# Patient Record
Sex: Female | Born: 1965
Health system: Southern US, Community
[De-identification: ages and names within clinical notes are randomized; demographics above are authoritative.]

## PROBLEM LIST (undated history)

## (undated) DIAGNOSIS — E039 Hypothyroidism, unspecified: Secondary | ICD-10-CM

## (undated) DIAGNOSIS — M1990 Unspecified osteoarthritis, unspecified site: Secondary | ICD-10-CM

## (undated) DIAGNOSIS — D649 Anemia, unspecified: Secondary | ICD-10-CM

## (undated) DIAGNOSIS — E119 Type 2 diabetes mellitus without complications: Secondary | ICD-10-CM

## (undated) DIAGNOSIS — N183 Chronic kidney disease, stage 3 (moderate): Secondary | ICD-10-CM

## (undated) DIAGNOSIS — E785 Hyperlipidemia, unspecified: Secondary | ICD-10-CM

## (undated) DIAGNOSIS — R7989 Other specified abnormal findings of blood chemistry: Secondary | ICD-10-CM

## (undated) DIAGNOSIS — E282 Polycystic ovarian syndrome: Secondary | ICD-10-CM

## (undated) DIAGNOSIS — E669 Obesity, unspecified: Secondary | ICD-10-CM

## (undated) DIAGNOSIS — I10 Essential (primary) hypertension: Secondary | ICD-10-CM

## (undated) HISTORY — PX: LUMBAR LAMINECTOMY: SHX95

## (undated) HISTORY — PX: ESOPHAGOGASTRODUODENOSCOPY: SHX1529

## (undated) HISTORY — PX: APPENDECTOMY: SHX54

## (undated) HISTORY — DX: Unspecified osteoarthritis, unspecified site: M19.90

## (undated) HISTORY — PX: NEPHRECTOMY: SHX65

## (undated) HISTORY — PX: OTHER SURGICAL HISTORY: SHX169

## (undated) HISTORY — DX: Anemia, unspecified: D64.9

## (undated) HISTORY — PX: LUMBAR FUSION: SHX111

## (undated) HISTORY — DX: Essential (primary) hypertension: I10

## (undated) HISTORY — DX: Other specified abnormal findings of blood chemistry: R79.89

## (undated) HISTORY — DX: Chronic kidney disease, stage 3 (moderate): N18.3

## (undated) HISTORY — PX: TUBAL LIGATION: SHX77

## (undated) HISTORY — DX: Obesity, unspecified: E66.9

## (undated) HISTORY — PX: POLYPECTOMY: SHX149

## (undated) HISTORY — DX: Hyperlipidemia, unspecified: E78.5

## (undated) HISTORY — PX: COLONOSCOPY: SHX174

## (undated) HISTORY — DX: Polycystic ovarian syndrome: E28.2

## (undated) HISTORY — DX: Hypothyroidism, unspecified: E03.9

## (undated) HISTORY — DX: Type 2 diabetes mellitus without complications: E11.9

---

## 1997-10-03 ENCOUNTER — Encounter: Admission: RE | Admit: 1997-10-03 | Discharge: 1998-01-01 | Payer: Self-pay | Admitting: Family Medicine

## 1998-11-13 ENCOUNTER — Encounter: Admission: RE | Admit: 1998-11-13 | Discharge: 1998-11-13 | Payer: Self-pay | Admitting: *Deleted

## 1998-11-19 ENCOUNTER — Encounter: Admission: RE | Admit: 1998-11-19 | Discharge: 1998-11-19 | Payer: Self-pay | Admitting: *Deleted

## 1999-11-13 ENCOUNTER — Ambulatory Visit (HOSPITAL_BASED_OUTPATIENT_CLINIC_OR_DEPARTMENT_OTHER): Admission: RE | Admit: 1999-11-13 | Discharge: 1999-11-13 | Payer: Self-pay | Admitting: Orthopedic Surgery

## 1999-11-13 ENCOUNTER — Encounter (INDEPENDENT_AMBULATORY_CARE_PROVIDER_SITE_OTHER): Payer: Self-pay | Admitting: *Deleted

## 1999-12-01 ENCOUNTER — Encounter (HOSPITAL_COMMUNITY): Admission: RE | Admit: 1999-12-01 | Discharge: 2000-02-29 | Payer: Self-pay | Admitting: Family Medicine

## 1999-12-01 ENCOUNTER — Encounter: Payer: Self-pay | Admitting: Family Medicine

## 2000-05-26 ENCOUNTER — Other Ambulatory Visit: Admission: RE | Admit: 2000-05-26 | Discharge: 2000-05-26 | Payer: Self-pay | Admitting: Gynecology

## 2000-05-26 ENCOUNTER — Encounter (INDEPENDENT_AMBULATORY_CARE_PROVIDER_SITE_OTHER): Payer: Self-pay

## 2001-05-24 ENCOUNTER — Other Ambulatory Visit: Admission: RE | Admit: 2001-05-24 | Discharge: 2001-05-24 | Payer: Self-pay | Admitting: Gynecology

## 2003-05-09 ENCOUNTER — Encounter: Admission: RE | Admit: 2003-05-09 | Discharge: 2003-05-09 | Payer: Self-pay | Admitting: Family Medicine

## 2003-09-06 ENCOUNTER — Ambulatory Visit (HOSPITAL_COMMUNITY): Admission: RE | Admit: 2003-09-06 | Discharge: 2003-09-07 | Payer: Self-pay | Admitting: Orthopaedic Surgery

## 2004-03-19 ENCOUNTER — Encounter: Admission: RE | Admit: 2004-03-19 | Discharge: 2004-03-19 | Payer: Self-pay | Admitting: Family Medicine

## 2004-07-10 ENCOUNTER — Other Ambulatory Visit: Admission: RE | Admit: 2004-07-10 | Discharge: 2004-07-10 | Payer: Self-pay | Admitting: Gynecology

## 2004-08-21 ENCOUNTER — Encounter: Admission: RE | Admit: 2004-08-21 | Discharge: 2004-08-21 | Payer: Self-pay | Admitting: Gynecology

## 2004-08-26 ENCOUNTER — Inpatient Hospital Stay (HOSPITAL_COMMUNITY): Admission: RE | Admit: 2004-08-26 | Discharge: 2004-08-30 | Payer: Self-pay | Admitting: Neurosurgery

## 2005-03-25 ENCOUNTER — Encounter: Admission: RE | Admit: 2005-03-25 | Discharge: 2005-03-25 | Payer: Self-pay | Admitting: Neurosurgery

## 2005-04-17 ENCOUNTER — Encounter: Admission: RE | Admit: 2005-04-17 | Discharge: 2005-04-17 | Payer: Self-pay | Admitting: Neurosurgery

## 2005-06-02 ENCOUNTER — Ambulatory Visit (HOSPITAL_COMMUNITY): Admission: RE | Admit: 2005-06-02 | Discharge: 2005-06-03 | Payer: Self-pay | Admitting: Neurosurgery

## 2005-06-09 ENCOUNTER — Ambulatory Visit (HOSPITAL_COMMUNITY): Admission: RE | Admit: 2005-06-09 | Discharge: 2005-06-10 | Payer: Self-pay | Admitting: Neurosurgery

## 2006-05-07 ENCOUNTER — Encounter: Admission: RE | Admit: 2006-05-07 | Discharge: 2006-05-07 | Payer: Self-pay | Admitting: Neurosurgery

## 2006-05-26 ENCOUNTER — Other Ambulatory Visit: Admission: RE | Admit: 2006-05-26 | Discharge: 2006-05-26 | Payer: Self-pay | Admitting: Gynecology

## 2006-06-01 ENCOUNTER — Encounter: Admission: RE | Admit: 2006-06-01 | Discharge: 2006-06-01 | Payer: Self-pay | Admitting: Neurosurgery

## 2006-06-09 ENCOUNTER — Encounter: Admission: RE | Admit: 2006-06-09 | Discharge: 2006-06-09 | Payer: Self-pay | Admitting: Gynecology

## 2006-07-05 ENCOUNTER — Encounter: Admission: RE | Admit: 2006-07-05 | Discharge: 2006-07-05 | Payer: Self-pay | Admitting: Neurosurgery

## 2006-08-24 ENCOUNTER — Encounter: Admission: RE | Admit: 2006-08-24 | Discharge: 2006-08-24 | Payer: Self-pay | Admitting: Neurosurgery

## 2007-01-21 ENCOUNTER — Encounter (INDEPENDENT_AMBULATORY_CARE_PROVIDER_SITE_OTHER): Payer: Self-pay | Admitting: *Deleted

## 2007-01-21 ENCOUNTER — Encounter: Payer: Self-pay | Admitting: Family Medicine

## 2007-01-21 LAB — CONVERTED CEMR LAB
ALT: 17 units/L
Albumin: 4.6 g/dL
BUN: 32 mg/dL
CO2, serum: 21 mmol/L
Calcium: 10.9 mg/dL
Cholesterol: 212 mg/dL
Creatinine, Ser: 1.84 mg/dL
HCT: 38.3 %
HDL: 26 mg/dL
Hemoglobin: 11.9 g/dL
MCV: 85.3 fL
RBC count: 4.49 10*6/uL
Sodium, serum: 138 mmol/L
WBC, blood: 9.8 10*3/uL

## 2007-06-01 ENCOUNTER — Encounter (INDEPENDENT_AMBULATORY_CARE_PROVIDER_SITE_OTHER): Payer: Self-pay | Admitting: *Deleted

## 2007-06-01 ENCOUNTER — Encounter: Payer: Self-pay | Admitting: Family Medicine

## 2007-06-01 LAB — CONVERTED CEMR LAB
Albumin: 4.5 g/dL
CO2, serum: 29 mmol/L
Sodium, serum: 140 mmol/L

## 2007-07-01 ENCOUNTER — Encounter: Admission: RE | Admit: 2007-07-01 | Discharge: 2007-07-01 | Payer: Self-pay | Admitting: Gynecology

## 2007-07-08 ENCOUNTER — Other Ambulatory Visit: Admission: RE | Admit: 2007-07-08 | Discharge: 2007-07-08 | Payer: Self-pay | Admitting: Gynecology

## 2007-08-29 ENCOUNTER — Encounter: Payer: Self-pay | Admitting: Family Medicine

## 2007-08-30 ENCOUNTER — Encounter (INDEPENDENT_AMBULATORY_CARE_PROVIDER_SITE_OTHER): Payer: Self-pay | Admitting: *Deleted

## 2007-08-30 LAB — CONVERTED CEMR LAB
Glucose, Bld: 137 mg/dL
Hgb A1c MFr Bld: 7.6 %

## 2008-01-02 ENCOUNTER — Encounter: Admission: RE | Admit: 2008-01-02 | Discharge: 2008-01-02 | Payer: Self-pay | Admitting: Neurosurgery

## 2008-01-30 ENCOUNTER — Ambulatory Visit: Payer: Self-pay | Admitting: Family Medicine

## 2008-01-30 DIAGNOSIS — R252 Cramp and spasm: Secondary | ICD-10-CM | POA: Insufficient documentation

## 2008-01-30 DIAGNOSIS — E119 Type 2 diabetes mellitus without complications: Secondary | ICD-10-CM

## 2008-01-30 DIAGNOSIS — N183 Chronic kidney disease, stage 3 unspecified: Secondary | ICD-10-CM | POA: Insufficient documentation

## 2008-01-30 DIAGNOSIS — I10 Essential (primary) hypertension: Secondary | ICD-10-CM | POA: Insufficient documentation

## 2008-01-30 DIAGNOSIS — E1169 Type 2 diabetes mellitus with other specified complication: Secondary | ICD-10-CM | POA: Insufficient documentation

## 2008-01-30 DIAGNOSIS — E785 Hyperlipidemia, unspecified: Secondary | ICD-10-CM

## 2008-01-30 HISTORY — DX: Type 2 diabetes mellitus without complications: E11.9

## 2008-01-30 HISTORY — DX: Hyperlipidemia, unspecified: E78.5

## 2008-01-30 HISTORY — DX: Chronic kidney disease, stage 3 unspecified: N18.30

## 2008-01-30 HISTORY — DX: Essential (primary) hypertension: I10

## 2008-01-30 LAB — CONVERTED CEMR LAB
ALT: 15 units/L (ref 0–35)
Bilirubin, Direct: 0.1 mg/dL (ref 0.0–0.3)
CO2: 24 meq/L (ref 19–32)
Calcium: 9.5 mg/dL (ref 8.4–10.5)
Creatinine, Ser: 1.5 mg/dL — ABNORMAL HIGH (ref 0.4–1.2)
Direct LDL: 150.2 mg/dL
GFR calc non Af Amer: 40 mL/min
Hgb A1c MFr Bld: 5.7 % (ref 4.6–6.0)
Sodium: 139 meq/L (ref 135–145)
TSH: 3.47 microintl units/mL (ref 0.35–5.50)
Total Bilirubin: 0.5 mg/dL (ref 0.3–1.2)
Total CHOL/HDL Ratio: 10.5
Triglycerides: 415 mg/dL (ref 0–149)
VLDL: 83 mg/dL — ABNORMAL HIGH (ref 0–40)

## 2008-02-01 ENCOUNTER — Encounter (INDEPENDENT_AMBULATORY_CARE_PROVIDER_SITE_OTHER): Payer: Self-pay | Admitting: *Deleted

## 2008-02-10 ENCOUNTER — Ambulatory Visit: Payer: Self-pay | Admitting: Family Medicine

## 2008-03-13 ENCOUNTER — Ambulatory Visit: Payer: Self-pay | Admitting: Family Medicine

## 2008-03-13 DIAGNOSIS — L259 Unspecified contact dermatitis, unspecified cause: Secondary | ICD-10-CM | POA: Insufficient documentation

## 2008-03-13 DIAGNOSIS — H6690 Otitis media, unspecified, unspecified ear: Secondary | ICD-10-CM | POA: Insufficient documentation

## 2008-03-14 LAB — CONVERTED CEMR LAB
ALT: 17 units/L (ref 0–35)
Bilirubin, Direct: 0.1 mg/dL (ref 0.0–0.3)
Total Bilirubin: 0.6 mg/dL (ref 0.3–1.2)
Total Protein: 7.5 g/dL (ref 6.0–8.3)

## 2008-03-19 ENCOUNTER — Telehealth (INDEPENDENT_AMBULATORY_CARE_PROVIDER_SITE_OTHER): Payer: Self-pay | Admitting: *Deleted

## 2008-05-23 ENCOUNTER — Encounter: Admission: RE | Admit: 2008-05-23 | Discharge: 2008-05-23 | Payer: Self-pay | Admitting: Orthopedic Surgery

## 2008-06-06 ENCOUNTER — Ambulatory Visit: Payer: Self-pay | Admitting: Family Medicine

## 2008-06-06 DIAGNOSIS — M25539 Pain in unspecified wrist: Secondary | ICD-10-CM | POA: Insufficient documentation

## 2008-06-07 ENCOUNTER — Telehealth (INDEPENDENT_AMBULATORY_CARE_PROVIDER_SITE_OTHER): Payer: Self-pay | Admitting: *Deleted

## 2008-06-07 LAB — CONVERTED CEMR LAB: Hgb A1c MFr Bld: 7.4 % — ABNORMAL HIGH (ref 4.6–6.5)

## 2008-06-20 ENCOUNTER — Encounter: Payer: Self-pay | Admitting: Family Medicine

## 2008-07-26 ENCOUNTER — Encounter: Admission: RE | Admit: 2008-07-26 | Discharge: 2008-07-26 | Payer: Self-pay | Admitting: Neurosurgery

## 2008-08-30 ENCOUNTER — Encounter: Admission: RE | Admit: 2008-08-30 | Discharge: 2008-08-30 | Payer: Self-pay | Admitting: Neurosurgery

## 2008-10-01 ENCOUNTER — Ambulatory Visit: Payer: Self-pay | Admitting: Family Medicine

## 2008-10-01 DIAGNOSIS — E790 Hyperuricemia without signs of inflammatory arthritis and tophaceous disease: Secondary | ICD-10-CM | POA: Insufficient documentation

## 2008-10-01 DIAGNOSIS — R7989 Other specified abnormal findings of blood chemistry: Secondary | ICD-10-CM

## 2008-10-01 HISTORY — DX: Other specified abnormal findings of blood chemistry: R79.89

## 2008-10-02 ENCOUNTER — Telehealth (INDEPENDENT_AMBULATORY_CARE_PROVIDER_SITE_OTHER): Payer: Self-pay | Admitting: *Deleted

## 2008-10-02 LAB — CONVERTED CEMR LAB
ALT: 15 units/L (ref 0–35)
AST: 15 units/L (ref 0–37)
Albumin: 3.6 g/dL (ref 3.5–5.2)
Calcium: 9.6 mg/dL (ref 8.4–10.5)
Chloride: 110 meq/L (ref 96–112)
Cholesterol: 231 mg/dL — ABNORMAL HIGH (ref 0–200)
Creatinine, Ser: 1.3 mg/dL — ABNORMAL HIGH (ref 0.4–1.2)
Direct LDL: 151 mg/dL
HDL: 33 mg/dL — ABNORMAL LOW (ref >39.00)
Hgb A1c MFr Bld: 7 % — ABNORMAL HIGH (ref 4.6–6.5)
Sodium: 139 meq/L (ref 135–145)
Triglycerides: 277 mg/dL — ABNORMAL HIGH (ref 0.0–149.0)
Uric Acid, Serum: 9.1 mg/dL — ABNORMAL HIGH (ref 2.4–7.0)

## 2008-10-11 ENCOUNTER — Encounter (INDEPENDENT_AMBULATORY_CARE_PROVIDER_SITE_OTHER): Payer: Self-pay | Admitting: *Deleted

## 2008-11-14 ENCOUNTER — Ambulatory Visit: Payer: Self-pay | Admitting: Family Medicine

## 2008-11-30 ENCOUNTER — Encounter: Admission: RE | Admit: 2008-11-30 | Discharge: 2008-11-30 | Payer: Self-pay | Admitting: Gynecology

## 2008-12-26 ENCOUNTER — Encounter: Payer: Self-pay | Admitting: Family Medicine

## 2008-12-26 ENCOUNTER — Ambulatory Visit: Payer: Self-pay | Admitting: Family

## 2008-12-26 ENCOUNTER — Encounter: Payer: Self-pay | Admitting: Internal Medicine

## 2008-12-26 LAB — CONVERTED CEMR LAB
BUN: 21 mg/dL (ref 6–23)
CO2: 24 meq/L (ref 19–32)
Calcium: 9.5 mg/dL (ref 8.4–10.5)
Chloride: 105 meq/L (ref 96–112)
Creatinine, Ser: 1.1 mg/dL (ref 0.4–1.2)
Direct LDL: 72.2 mg/dL
Glucose, Bld: 163 mg/dL — ABNORMAL HIGH (ref 70–99)
Total CHOL/HDL Ratio: 7

## 2008-12-28 ENCOUNTER — Encounter: Payer: Self-pay | Admitting: Family

## 2008-12-31 ENCOUNTER — Telehealth: Payer: Self-pay | Admitting: Family

## 2009-01-04 ENCOUNTER — Ambulatory Visit: Payer: Self-pay | Admitting: Endocrinology

## 2009-01-04 ENCOUNTER — Telehealth: Payer: Self-pay | Admitting: Endocrinology

## 2009-01-04 DIAGNOSIS — E282 Polycystic ovarian syndrome: Secondary | ICD-10-CM

## 2009-01-04 HISTORY — DX: Polycystic ovarian syndrome: E28.2

## 2009-01-07 ENCOUNTER — Encounter: Payer: Self-pay | Admitting: Endocrinology

## 2009-03-29 ENCOUNTER — Ambulatory Visit: Payer: Self-pay | Admitting: Endocrinology

## 2009-06-04 ENCOUNTER — Ambulatory Visit: Payer: Self-pay | Admitting: Endocrinology

## 2009-06-04 LAB — CONVERTED CEMR LAB
Cholesterol: 196 mg/dL (ref 0–200)
Direct LDL: 114.4 mg/dL
HDL: 25.9 mg/dL — ABNORMAL LOW (ref >39.00)
Triglycerides: 335 mg/dL — ABNORMAL HIGH (ref 0.0–149.0)

## 2009-06-25 ENCOUNTER — Encounter: Payer: Self-pay | Admitting: Endocrinology

## 2009-09-10 ENCOUNTER — Ambulatory Visit: Payer: Self-pay | Admitting: Family Medicine

## 2009-10-03 ENCOUNTER — Ambulatory Visit: Payer: Self-pay | Admitting: Family Medicine

## 2009-10-10 ENCOUNTER — Telehealth (INDEPENDENT_AMBULATORY_CARE_PROVIDER_SITE_OTHER): Payer: Self-pay | Admitting: *Deleted

## 2009-10-10 LAB — CONVERTED CEMR LAB
ALT: 15 units/L (ref 0–35)
AST: 18 units/L (ref 0–37)
Albumin: 4 g/dL (ref 3.5–5.2)
Alkaline Phosphatase: 38 units/L — ABNORMAL LOW (ref 39–117)
Total Protein: 7.3 g/dL (ref 6.0–8.3)
VLDL: 61.6 mg/dL — ABNORMAL HIGH (ref 0.0–40.0)

## 2009-11-27 ENCOUNTER — Encounter: Admission: RE | Admit: 2009-11-27 | Discharge: 2009-11-27 | Payer: Self-pay | Admitting: Neurosurgery

## 2009-12-02 ENCOUNTER — Encounter: Admission: RE | Admit: 2009-12-02 | Discharge: 2009-12-02 | Payer: Self-pay | Admitting: Gynecology

## 2009-12-03 ENCOUNTER — Ambulatory Visit: Payer: Self-pay | Admitting: Endocrinology

## 2009-12-03 LAB — CONVERTED CEMR LAB: Hgb A1c MFr Bld: 6.4 % (ref 4.6–6.5)

## 2010-03-04 NOTE — Progress Notes (Signed)
Summary: labs  Phone Note Outgoing Call   Call placed by: Malachi Bonds CMA,  October 10, 2009 9:01 AM Call placed to: Patient Summary of Call: pt's LDL, VLDL, trigs all elevated.  HDL too low.  would really benefit from statin as pt's cholesterol is 2x her goal.  would recommend Crestor 10mg  nightly.  samples available to see if she can tolerate this dose.  very important to protect against heart disease and stroke.   Follow-up for Phone Call        spoke w/ patient says she can't take any cholesterol medications due to muscles aches since she already has this problems so would prefer not use any meds informed patient will let Dr. Birdie Riddle know.......Marland KitchenMalachi Bonds CMA  October 10, 2009 9:29 AM

## 2010-03-04 NOTE — Assessment & Plan Note (Signed)
Summary: LATE FEB FU  STC   Vital Signs:  Patient profile:   45 year old female Menstrual status:  regular Height:      61.25 inches (155.57 cm) Weight:      190.25 pounds (86.48 kg) O2 Sat:      99 % on Room air Temp:     99.0 degrees F (37.22 degrees C) oral Pulse rate:   71 / minute BP sitting:   140 / 90  (left arm) Cuff size:   large  Vitals Entered By: Gardenia Phlegm RMA (March 29, 2009 10:48 AM)  O2 Flow:  Room air CC: Follow-up visit/ pt states she never took Onglyza and she is no longer taking Crestor/ CF Is Patient Diabetic? Yes   Referring Provider:  tabori  CC:  Follow-up visit/ pt states she never took Onglyza and she is no longer taking Crestor/ CF.  History of Present Illness: pt states she feels well in general.  no cbg record, but states cbg's are well-controlled (low to mid-100's).   pt stopped the crestor due to myalgias, which have resolved since the discontinuation. she did not take the vastoec today.  she has no cough.  Current Medications (verified): 1)  Enalapril Maleate 10 Mg Tabs (Enalapril Maleate) .... Take One Tablet Two Times A Day. 2)  Furosemide 20 Mg Tabs (Furosemide) .... Take One Tablet As Needed 3)  Crestor 20 Mg Tabs (Rosuvastatin Calcium) .Marland Kitchen.. 1 Tablet By Mouth Daily 4)  Allopurinol 100 Mg Tabs (Allopurinol) .... Take One Tablet Daily 5)  Colchicine 0.6 Mg Tabs (Colchicine) .... Take As Needed 6)  Fenofibrate 160 Mg Tabs (Fenofibrate) .... One Tablet By Mouth Daily 7)  Actos 45 Mg Tabs (Pioglitazone Hcl) .Marland Kitchen.. 1 Qd 8)  Onglyza 5 Mg Tabs (Saxagliptin Hcl) .Marland Kitchen.. 1 Qam  Allergies (verified): 1)  ! Penicillin  Past History:  Past Medical History: Last updated: 10/01/2008 Diabetes mellitus, type II Hyperlipidemia Hypertension chronic kidney dz- follows w/ Renal elevated uric acid  Review of Systems  The patient denies dyspnea on exertion, weight loss, and weight gain.    Physical Exam  General:  obese.  no  distress  Extremities:  no edema.    Impression & Recommendations:  Problem # 1:  DIABETES MELLITUS, TYPE II (ICD-250.00) well-controlled  Problem # 2:  HYPERLIPIDEMIA (B2193296.4) myalgias perceived due to crestor.  Problem # 3:  HYPERTENSION (ICD-401.9) considering she has not yet taken her medication today, well-controlled.  Other Orders: Est. Patient Level IV [id number])  Patient Instructions: 1)  check your blood sugar 1 time a day.  vary the time of day when you check, between before the 3 meals, and at bedtime.  also check if you have symptoms of your blood sugar being too high or too low.  please keep a record of the readings and bring it to your next appointment here.  please call us sooner if you are having low blood sugar episodes. 2)  same actos (45 mg once daily). 3)  return 2 months, fasting (we may be able to reduce or stop the fenofibrate). 4)  same enalapril. 5)  redouble dietary efforts.

## 2010-03-04 NOTE — Assessment & Plan Note (Signed)
Summary: follow up r/s from 03/27/09 / tf,cma   Vital Signs:  Patient profile:   45 year old female Menstrual status:  regular Weight:      185 pounds Pulse rate:   66 / minute BP sitting:   120 / 80  (left arm)  Vitals Entered By: Malachi Bonds CMA (September 10, 2009 11:20 AM) CC: f/u on cholesterol    History of Present Illness: 45 yo woman here today for f/u on  1) cholesterol- ran out of fenofibrate 1 month ago.  due for labs today but would prefer to wait until meds back in system for 1 month.  2) HTN- BP well controlled on enalapril.  no CP, SOB, HAs, visual changes, edema.  3) Hyperuricemia- elevated on labs done by Dr Lorrene Reid.  recommended restarting allopurinol.  pt not interested.  has labs next month w/ Renal.  Current Medications (verified): 1)  Enalapril Maleate 10 Mg Tabs (Enalapril Maleate) .... Take One Tablet Two Times A Day. 2)  Furosemide 20 Mg Tabs (Furosemide) .... Take One Tablet As Needed 3)  Fenofibrate 160 Mg Tabs (Fenofibrate) .... One Tablet By Mouth Daily 4)  Actos 45 Mg Tabs (Pioglitazone Hcl) .Marland Kitchen.. 1 Qd  Allergies (verified): 1)  ! Penicillin  Past History:  Past Medical History: Last updated: 10/01/2008 Diabetes mellitus, type II Hyperlipidemia Hypertension chronic kidney dz- follows w/ Renal elevated uric acid  Review of Systems      See HPI  Physical Exam  General:  Well-developed,well-nourished,in no acute distress; alert,appropriate and cooperative throughout examination Neck:  No deformities, masses, or tenderness noted. Lungs:  Normal respiratory effort, chest expands symmetrically. Lungs are clear to auscultation, no crackles or wheezes. Heart:  Normal rate and regular rhythm. S1 and S2 normal without gallop, murmur, click, rub or other extra sounds. Abdomen:  soft, NT/ND, +BS Pulses:  +2 carotid, radial, DP Extremities:  No clubbing, cyanosis, edema, or deformity noted with normal full range of motion of all joints.      Impression & Recommendations:  Problem # 1:  HYPERLIPIDEMIA (B2193296.4) Assessment Unchanged pt has been off meds for  ~1 month.  wants to hold off on labs until she resumes meds for a month.  will have pt return for fasting labs. Her updated medication list for this problem includes:    Fenofibrate 160 Mg Tabs (Fenofibrate) ..... One tablet by mouth daily  Problem # 2:  HYPERTENSION (ICD-401.9) Assessment: Unchanged well controlled.  asymptomatic.  getting labs through renal. Her updated medication list for this problem includes:    Enalapril Maleate 10 Mg Tabs (Enalapril maleate) .Marland Kitchen... Take one tablet two times a day.    Furosemide 20 Mg Tabs (Furosemide) .Marland Kitchen... Take one tablet as needed  Problem # 3:  HYPERURICEMIA (ICD-790.6) Assessment: Unchanged discussed this w/ pt.  encouraged her to resume allopurinol as directed by Dr Lorrene Reid.  pt not interested in this.  will have labs next month.  will follow along.  Complete Medication List: 1)  Enalapril Maleate 10 Mg Tabs (Enalapril maleate) .... Take one tablet two times a day. 2)  Furosemide 20 Mg Tabs (Furosemide) .... Take one tablet as needed 3)  Fenofibrate 160 Mg Tabs (Fenofibrate) .... One tablet by mouth daily 4)  Actos 45 Mg Tabs (Pioglitazone hcl) .Marland Kitchen.. 1 qd  Patient Instructions: 1)  Schedule a lab visit in 4-6 weeks. 2)  Hepatic Panel prior to visit ICD-9: 272.4 3)  Lipid panel prior to visit ICD-9 : 272.4 4)  Keep up the good work on diet and exercise! 5)  Call with any questions or concerns 6)  Have a great labor day!!

## 2010-03-04 NOTE — Assessment & Plan Note (Signed)
Summary: 6 MTH FU  STC   Vital Signs:  Patient profile:   45 year old female Menstrual status:  regular Height:      60 inches (152.40 cm) Weight:      188.38 pounds (85.63 kg) BMI:     36.92 O2 Sat:      96 % on Room air Temp:     98.3 degrees F (36.83 degrees C) oral Pulse rate:   80 / minute BP sitting:   111 / 78  (left arm) Cuff size:   large  Vitals Entered By: Rebeca Alert CMA Deborra Medina) (December 03, 2009 10:43 AM)  O2 Flow:  Room air CC: 6 month F/U/aj Is Patient Diabetic? Yes   Referring Provider:  tabori  CC:  6 month F/U/aj.  History of Present Illness: no cbg record, but states cbg's were well-controlled, until a steriod injection last week.  it went up to the 200's, but has since returned to the low-100's.    Current Medications (verified): 1)  Enalapril Maleate 10 Mg Tabs (Enalapril Maleate) .... Take One Tablet Two Times A Day. 2)  Furosemide 20 Mg Tabs (Furosemide) .... Take One Tablet As Needed 3)  Fenofibrate 160 Mg Tabs (Fenofibrate) .... One Tablet By Mouth Daily 4)  Actos 45 Mg Tabs (Pioglitazone Hcl) .Marland Kitchen.. 1 Qd  Allergies (verified): 1)  ! Penicillin  Past History:  Past Medical History: Last updated: 10/01/2008 Diabetes mellitus, type II Hyperlipidemia Hypertension chronic kidney dz- follows w/ Renal elevated uric acid  Social History: Reviewed history from 01/04/2009 and no changes required. No tobacco, ETOH Married disabled for back pain  Review of Systems       The patient complains of weight gain.    Physical Exam  General:  obese.   Pulses:  dorsalis pedis intact bilat.  no carotid bruit  Extremities:  no deformity.  no ulcer on the feet.  feet are of normal color and temp.  no edema  Neurologic:  sensation is intact to touch on the feet  Additional Exam:  Hemoglobin A1C            6.4 %     Impression & Recommendations:  Problem # 1:  DIABETES MELLITUS, TYPE II (ICD-250.00) well-controlled  Other Orders: TLB-A1C /  Hgb A1C (Glycohemoglobin) (83036-A1C) Est. Patient Level III [id number])  Patient Instructions: 1)  blood tests are being ordered for you today.  please call [phone] to hear your test results. 2)  pending the test results, please continue the same medications for now. 3)  Please schedule a follow-up appointment in 6 months. 4)  (update: i left message on phone-tree:  rx as we discussed)   Orders Added: 1)  TLB-A1C / Hgb A1C (Glycohemoglobin) [83036-A1C] 2)  Est. Patient Level III [id number]

## 2010-03-04 NOTE — Letter (Signed)
Summary: [facility]   Imported By: Phillis Knack 07/23/2009 11:05:37  _____________________________________________________________________  External Attachment:    Type:   Image     Comment:   External Document

## 2010-03-04 NOTE — Assessment & Plan Note (Signed)
Summary: 2 MTH FU--STC     Vital Signs:  Patient profile:   45 year old female Menstrual status:  regular Height:      60 inches Weight:      186 pounds BMI:     36.46 O2 Sat:      99 % on Room air Temp:     98 degrees F oral Pulse rate:   68 / minute BP sitting:   120 / 80  (left arm) Cuff size:   regular  Vitals Entered ByShirlean Mylar Ewing (Jun 04, 2009 9:07 AM)  O2 Flow:  Room air CC: 2 month followup/RE   Referring Provider:  tabori  CC:  2 month followup/RE.  History of Present Illness: pt states she feels well in general, except for allergy problems.  no cbg record, but states cbg's are  well-controlled.  Current Medications (verified): 1)  Enalapril Maleate 10 Mg Tabs (Enalapril Maleate) .... Take One Tablet Two Times A Day. 2)  Furosemide 20 Mg Tabs (Furosemide) .... Take One Tablet As Needed 3)  Allopurinol 100 Mg Tabs (Allopurinol) .... Take One Tablet Daily 4)  Colchicine 0.6 Mg Tabs (Colchicine) .... Take As Needed 5)  Fenofibrate 160 Mg Tabs (Fenofibrate) .... One Tablet By Mouth Daily 6)  Actos 45 Mg Tabs (Pioglitazone Hcl) .Marland Kitchen.. 1 Qd  Allergies (verified): 1)  ! Penicillin  Past History:  Past Medical History: Last updated: 10/01/2008 Diabetes mellitus, type II Hyperlipidemia Hypertension chronic kidney dz- follows w/ Renal elevated uric acid  Past Surgical History: Reviewed history from 01/30/2008 and no changes required. Lumbar laminectomy-L4-5 Reflux surgery Nephrectomy-left removed, partial right-Ottelin Appendectomy Right Hand cyst Tubal ligation- GYN Mezer L4-5 diskectomy Neural Stimulator  Social History: Reviewed history from 01/04/2009 and no changes required. No tobacco, ETOH Married disabled  Review of Systems  The patient denies hypoglycemia.    Physical Exam  General:  normal appearance.   Extremities:  no edema.  Additional Exam:  Hemoglobin A1C            6.1 %                       4.6-6.5 LDL      114.4 mg/dL     Impression & Recommendations:  Problem # 1:  DIABETES MELLITUS, TYPE II (ICD-250.00) well-controlled  Problem # 2:  dyslipidemia needs increased rx, in view of #1  Other Orders: TLB-Lipid Panel (80061-LIPID) TLB-A1C / Hgb A1C (Glycohemoglobin) (83036-A1C) Est. Patient Level III [id number])  Patient Instructions: 1)  check your blood sugar 1 time a day.  vary the time of day when you check, between before the 3 meals, and at bedtime.  also check if you have symptoms of your blood sugar being too high or too low.  please keep a record of the readings and bring it to your next appointment here.  please call us sooner if you are having low blood sugar episodes. 2)  tests are being ordered for you today.  a few days after the test(s), please call [phone] to hear your test results. 3)  pending the test results, please continue the same medications for now. 4)  Please schedule a follow-up appointment in 6 months. 5)  (update: i left message on phone-tree:  rx as we discussed.  you should also consider adding zocor).

## 2010-06-03 ENCOUNTER — Other Ambulatory Visit (INDEPENDENT_AMBULATORY_CARE_PROVIDER_SITE_OTHER): Payer: Medicare Other

## 2010-06-03 ENCOUNTER — Encounter: Payer: Self-pay | Admitting: Endocrinology

## 2010-06-03 ENCOUNTER — Ambulatory Visit (INDEPENDENT_AMBULATORY_CARE_PROVIDER_SITE_OTHER): Payer: Medicare Other | Admitting: Endocrinology

## 2010-06-03 VITALS — BP 120/70 | HR 74 | Temp 98.9°F | Resp 16 | Ht 60.0 in | Wt 195.0 lb

## 2010-06-03 DIAGNOSIS — E119 Type 2 diabetes mellitus without complications: Secondary | ICD-10-CM

## 2010-06-03 LAB — HEMOGLOBIN A1C: Hgb A1c MFr Bld: 7 % — ABNORMAL HIGH (ref 4.6–6.5)

## 2010-06-03 NOTE — Patient Instructions (Addendum)
blood tests are being ordered for you today.  please call [phone] to hear your test results. pending the test results, please continue the same medications for now good diet and exercise habits significanly improve the control of your diabetes.  please let me know if you wish to be referred to a dietician.  high blood sugar is very risky to your health.  you should see an eye doctor every year. controlling your blood pressure and cholesterol drastically reduces the damage diabetes does to your body.  this also applies to quitting smoking.  please discuss these with your doctor.  you should take an aspirin every day, unless you have been advised by a doctor not to. Please make a follow-up appointment in 6 months. (update: i left message on phone-tree:  You should add metformin (preferred) or [place].

## 2010-06-03 NOTE — Progress Notes (Signed)
  Subjective:    Patient ID: Marissa Scott, female    DOB: 28-Oct-1965, 45 y.o.   MRN: [id number]  HPI Pt returns for f/u of type 2 dm.  pt states she feels well in general.  She takes actos as rx'ed.  She says cbg's are well-controlled. No past medical history on file. No past surgical history on file.  reports that she has never smoked. She does not have any smokeless tobacco history on file. Her alcohol and drug histories not on file. family history is not on file. Allergies  Allergen Reactions  . Penicillins      Review of Systems Denies weight change    Objective:   Physical Exam GENERAL: no distress Pulses: dorsalis pedis intact bilat.   Feet: no deformity.  no ulcer on the feet.  feet are of normal color and temp.  no edema.  There is onychomycosis of the left 3rd toenail. Neuro: sensation is intact to touch on the feet.    Lab Results  Component Value Date   HGBA1C 7.0* 06/03/2010    Assessment & Plan:  Dm, worse control

## 2010-06-20 NOTE — Op Note (Signed)
NAMESHAYLI, FLOBERG               ACCOUNT NO.:  [id number]   MEDICAL RECORD NO.:  [id number]          PATIENT TYPE:  INP   LOCATION:  2899                         FACILITY:  [facility]   PHYSICIAN:  Elizabeth Sauer, M.D.      DATE OF BIRTH:  08/20/1965   DATE OF PROCEDURE:  08/26/2004  DATE OF DISCHARGE:                                 OPERATIVE REPORT   PREOPERATIVE DIAGNOSIS:  Spondylosis and extraforaminal disk at L4-5 on the  left.   POSTOPERATIVE DIAGNOSIS:  Spondylosis and extraforaminal disk at L4-5 on the  left.   OPERATION PERFORMED:  L4-5 laminectomy and diskectomy, posterior lumbar  interbody fusion with Ray threaded fusion cages, posterolateral arthrodesis  with InFuse bone morphogenic protein.   SURGEON:  Elizabeth Sauer, M.D.   NURSE ASSISTANT:  [facility].   DOCTOR ASSISTANT:  Otilio Connors, M.D.   ANESTHESIA:  General endotracheal.   PREP:  Sterile Betadine prep and scrub with alcohol wipe.   COMPLICATIONS:  None.   INDICATIONS FOR PROCEDURE:  This is a 45 year old lady who had had a prior  foraminal diskectomy at 4-5 on the left  but has mild left leg pain and MR  evidence of a disk in the left neural foramen.   DESCRIPTION OF PROCEDURE:  The patient was taken to the operating room,  smoothly anesthetized, intubated, and placed prone on the operating table.  Following shave, prep and drape in the usual sterile fashion, a midline  incision was created, extending from mid-L3 to mid-L5.  The lamina of L4 and  the transverse processes of L4 and L5 as well as the 4-5 facet joint were  exposed bilaterally in the subperiosteal plane.  Intraoperative x-ray  confirmed correctness of the level.  Having confirmed correctness of level,  the pars interarticularis lamina and inferior facet of L4 and superior facet  of L5 were removed bilaterally using the high speed drill.  This allowed  decompression of the nerve roots and exposure of the 4 root and 5 roots that  traversed  this area.  On the right side, there was some mild bulging of the  4-5 disk.  On the left side, there was a prominent subligamentous disk with  elevation of the left L4 nerve root and compression of the dorsal root  ganglion and the neural foramen.  This was all completely decompressed.  The  wound was irrigated and hemostasis assured.  16 x 21 mm  Ray threaded fusion  cages were placed at 4-5.  The transverse processes were decorticated.  Bone  graft preserved from the facet joints was placed within the cages and they  were capped across the intertransverse area.  Bone morphogenic protein as  InFuse on the carrier matrix was placed.  Intraoperative x-ray showed good  placement of Ray cages.  The fascia and subcutaneous tissues were  reapproximated with 0 Vicryl in interrupted fashion.  Subcuticular tissues  were reapproximated with 3-  0 Vicryl in interrupted fashion.  Skin was closed with 3-0 nylon in running  locked fashion.  Betadine and Telfa dressing was applied and  made occlusive  with OpSite.  The patient was then transferred to the recovery room in good  condition.       MWR/MEDQ  D:  08/26/2004  T:  08/26/2004  Job:  [id number]

## 2010-06-20 NOTE — H&P (Signed)
Marissa Scott, Marissa Scott               ACCOUNT NO.:  [id number]   MEDICAL RECORD NO.:  [id number]          PATIENT TYPE:  AMB   LOCATION:  SDS                          FACILITY:  [facility]   PHYSICIAN:  Elizabeth Sauer, M.D.      DATE OF BIRTH:  Jan 21, 1966   DATE OF ADMISSION:  06/02/2005  DATE OF DISCHARGE:                                HISTORY & PHYSICAL   ADMISSION DIAGNOSES:  Left L4 nerve root injury with placement of spinal  cord stimulator.   This is a very nice 45 year old right-handed white girl who underwent a  foraminal diskectomy in August 2005.  She did well for about 3 weeks, had  recurrence of her pain.  She had an MRI that demonstrated a 4-5 foraminal  disk with compression left L4 doral ganglion.  She underwent a decompression  and fusion at L4-5 in July 2006.  Postoperatively, her leg pain persisted  and is worsening.  She has undergone diskography and injection of the cages,  diskography at 5-1 for source of pain.  It is not there.  She has undergone  MRI of the brain and cervical and thoracic spine to rule out MS, and she is  now admitted for placement of spinal cord stimulator.   PAST MEDICAL HISTORY:  Remarkable for renal problems.  She has had a left  nephrectomy and partial right nephrectomy, although her renal function is  normal.   MEDICATIONS:  1.  Enalapril 5 mg twice a day.  2.  Avandia 4 mg twice a day for polycystic ovaries and ultrasound on a      p.r.n. basis.   SOCIAL HISTORY:  She does not smoke or drink and works for [employer].   FAMILY HISTORY:  Mom is 37 in good health.  Dad is 26 in good health with  cardiac disease, hypertension, renal disease, diabetes, and thyroid disease.   REVIEW OF SYSTEMS:  Remarkable for glasses, hypertension, swelling in hands  and feet, leg pain, back pain, leg weakness, arm pain, arthritis, and being  allergic to GRAPES.   PHYSICAL EXAMINATION:  HEENT:  Within normal limits.  NECK:  She has reasonable range of  motion of the neck.  CHEST: Clear.  CARDIAC: Regular rate and rhythm.  ABDOMEN:  Nontender without hepatosplenomegaly.  EXTREMITIES:  Without clubbing or cyanosis.  GU:  Exam is deferred.  PULSES:  Peripheral pulses are good.  NEUROLOGIC:  She is awake, alert, oriented.  Cranial nerves are intact.  Motor exam shows 5/5 strength throughout the upper and lower extremities  save the knee extensor on the left that were 5-/5.  Sensory dysesthesia  described in the left L4 and L5 distribution.  Deep tendon reflexes are 1 at  the right knee, absent at the left, flicker at the ankles bilaterally.  Straight leg raise is positive on the left.   Radiographic results have been reviewed.   CLINICAL IMPRESSION:  Left L4 radiculopathy related to chronic nerve root  injury.   PLAN:  Placement of spinal cord stimulator.  The risks and benefits of this  approach have been  discussed with her, and she wishes to proceed.           ______________________________  Elizabeth Sauer, M.D.     MWR/MEDQ  D:  06/02/2005  T:  06/02/2005  Job:  [id number]

## 2010-06-20 NOTE — Op Note (Signed)
Marissa Scott, Marissa Scott               ACCOUNT NO.:  [id number]   MEDICAL RECORD NO.:  [id number]          PATIENT TYPE:  OIB   LOCATION:  3032                         FACILITY:  [facility]   PHYSICIAN:  Elizabeth Sauer, M.D.      DATE OF BIRTH:  15-Jan-1966   DATE OF PROCEDURE:  06/09/2005  DATE OF DISCHARGE:                                 OPERATIVE REPORT   PREOPERATIVE DIAGNOSIS:  Successful spinal cord stimulator trial.   POSTOPERATIVE DIAGNOSIS:  Successful spinal cord stimulator trial.   OPERATIVE PROCEDURE:  Implant spinal cord stimulator battery.   SURGEON:  Elizabeth Sauer, M.D.   ANESTHESIA:  General endotracheal.   PREPARATION:  Betadine scrub with alcohol wipe.   COMPLICATIONS:  None.   ASSISTANT:  [place], [employer].   A 45 year old girl with successful spinal cord stimulator trial, taken to  the operating room in her bed, placed prone on the operating table.  Following sterile prep and drape in the usual fashion, old skin incision was  reopened and the wires were mobilized.  The left and right wires were  identified separately and extension wire was removed.  The subcutaneous  pocket was created just below the right superior iliac spine and the tunnel  was created between this pocket and the original operative site, through  which the wires were carried.  They were connected to the battery unit,  secured there.  The battery was placed in the subcutaneous pocket.  The  impedance testing was satisfactory.  Both wounds were irrigated.  Hemostasis  was assured.  Incisions closed in successive layers of 2-0 Vicryl, 3-0  Vicryl, and 3-0 nylon.  Betadine Telfa dressing was applied at each site and  the patient returned to the recovery room in good condition.           ______________________________  Elizabeth Sauer, M.D.     MWR/MEDQ  D:  06/09/2005  T:  06/10/2005  Job:  [id number]

## 2010-06-20 NOTE — H&P (Signed)
Marissa Scott, Marissa Scott               ACCOUNT NO.:  [id number]   MEDICAL RECORD NO.:  [id number]          PATIENT TYPE:  INP   LOCATION:  2899                         FACILITY:  [facility]   PHYSICIAN:  Elizabeth Sauer, M.D.      DATE OF BIRTH:  July 28, 1965   DATE OF ADMISSION:  08/26/2004  DATE OF DISCHARGE:                                HISTORY & PHYSICAL   ADMITTING DIAGNOSES:  Spondylosis L4-5.   A very nice 45 year old right-handed white woman with back pain and pain in  her leg.  Underwent a foraminal diskectomy in August of 2005.  Postoperatively she was good for about three weeks.  Had a recurrence of her  pain.  Has had several MRs since.  Has had fluid in the operative bed which  is not unusual.  Persistent pain in her back into her left leg, occasionally  her right, difficulty walking far.  MR showed disk into the left L4  neuroforamen with compression left L4 dorsal root ganglion.  She was sent  over to me and is now admitted for a decompression and fusion.   PAST MEDICAL HISTORY:  Remarkable for renal agenesis.  She has had a left  nephrectomy and a partial right nephrectomy, although her renal function is  normal at this time.  Her only other operation has been diskectomy as noted  above.  She takes enalapril 5 mg twice a day, Avandia 4 mg twice a day for  polycystic ovaries, and Ultracet on a p.r.n. basis.   SOCIAL HISTORY:  She does not smoke or drink and works for [employer].   FAMILY HISTORY:  Mom is 72, good health.  Dad is 62, in fair health.  Cardiac disease, hypertension, renal disease, diabetes, and thyroid disease.   REVIEW OF SYSTEMS:  Remarkable for glasses, hypertension, swelling in her  hands and feet, leg pain, back pain, leg weakness, arm pain, arthritis.  She  is allergic to GRAPES.   PHYSICAL EXAMINATION:  HEENT:  Within normal limits.  She has good range of  motion of her neck.  CHEST:  Clear.  CARDIAC:  Regular rate and rhythm.  ABDOMEN:  Nontender.   No hepatosplenomegaly.  EXTREMITIES:  Without clubbing or cyanosis.  GENITOURINARY:  Deferred.  PERIPHERAL:  Pulses are good.  NEUROLOGIC:  She is awake, alert, and oriented.  Her cranial nerves are  intact.  Motor examination shows 5/5 strength throughout the upper and lower  extremities save for the knee extensors on the left side they are 5-/5 with  sensory dysesthesia described in her left L4 and L5 distribution.  Deep  tendon reflexes are 1 at the right knee, absent at the left, ___________ at  the ankles bilaterally.  Straight leg raise is very mildly positive for left  knee pain.   MR demonstrates foraminal disk on the left with some scarring.  Sagittal  images show obvious encroachment on the left L4 nerve root neuroforamen.   CLINICAL IMPRESSION:  Left L4 radiculopathy secondary to L4-5 foraminal  disk.  We discussed redo diskectomy and decided on decompression and fusion  at this level.  The risks and benefits of this approach have been discussed  with her and she wished to proceed.       MWR/MEDQ  D:  08/26/2004  T:  08/26/2004  Job:  [id number]

## 2010-06-20 NOTE — Discharge Summary (Signed)
Marissa Scott, Marissa Scott               ACCOUNT NO.:  [id number]   MEDICAL RECORD NO.:  [id number]          PATIENT TYPE:  INP   LOCATION:  3009                         FACILITY:  [facility]   PHYSICIAN:  Elizabeth Sauer, M.D.      DATE OF BIRTH:  [birth year]   DATE OF ADMISSION:  08/26/2004  DATE OF DISCHARGE:  08/30/2004                                 DISCHARGE SUMMARY   ADMITTING DIAGNOSES:  Spondylosis L4-5.   DISCHARGE DIAGNOSES:  Spondylosis L4-5.   OPERATIVE PROCEDURE:  L4-5 laminectomy, diskectomy, posterior interbody  fusion with __________ fusion cages, posterolateral arthrodesis.   COMPLICATIONS:  None.   DISCHARGE STATUS:  Alive and well.   This is a 45 year old right-handed white girl whose history and physical is  recounted in the chart.  She had a diskectomy in August of 2005.  Did well.  Had increasing pain.  MR showed disk recurrence and she was admitted for  fusion.   PAST MEDICAL HISTORY:  Nephrectomy.   PHYSICAL EXAMINATION:  GENERAL:  Intact.  NEUROLOGIC:  Intact with a left L4 radiculopathy.   She was admitted after ascertaining normal laboratory values and underwent a  fusion.  Postoperatively she did reasonably well.  Foley was removed the  first day.  PCA was stopped the second day.  Third day she was up and about,  eating and voiding normally.  She was discharged home in the care of her  family with Percocet for pain.  Her follow-up will be in the [place] office in a week.           ______________________________  Elizabeth Sauer, M.D.     MWR/MEDQ  D:  10/31/2004  T:  10/31/2004  Job:  [id number]

## 2010-06-20 NOTE — H&P (Signed)
Marissa Scott, PARMER               ACCOUNT NO.:  [id number]   MEDICAL RECORD NO.:  [id number]          PATIENT TYPE:  AMB   LOCATION:  SDS                          FACILITY:  [facility]   PHYSICIAN:  Elizabeth Sauer, M.D.      DATE OF BIRTH:  21-Jan-1966   DATE OF ADMISSION:  06/09/2005  DATE OF DISCHARGE:                                HISTORY & PHYSICAL   ADMISSION DIAGNOSES:  Spinal cord trial electrode in place.   SERVICE:  Neurosurgery, Elizabeth Sauer, M.D.   HISTORY OF PRESENT ILLNESS:  This is a 45 year old right handed white girl  who one week ago underwent placement of spinal cord stimulator trial and has  done well and is now admitted for placement of a battery.   PAST MEDICAL HISTORY:  Her medical history is remarkable for renal agenesis.  She has had a left nephrectomy and partial right nephrectomy, renal function  is normal.   MEDICATIONS:  She takes Enalapril, Avandia, Ultracet.   SOCIAL HISTORY:  She does not smoke or drink.  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   FAMILY HISTORY:  Mom is 68, Dad is 52 in fair health.  Cardiac disease,  hypertension, renal disease, diabetes, thyroid disease.   REVIEW OF SYSTEMS:  Remarkable for swelling in her hands and feet, leg pain,  back pain, leg weakness, arm pain, arthritis.   PHYSICAL EXAMINATION:  HEENT:  Examination is within normal limits.  NECK:  She has reasonable range of motion.  CHEST:  Clear.  CARDIAC:  Examination has regular rate and rhythm.  ABDOMEN:  Nontender with no hepatosplenomegaly.  EXTREMITIES:  Without cyanosis, clubbing.  GENITOURINARY:  Examination is deferred.  EXTREMITIES:  Peripheral pulses are good.  NEUROLOGICAL:  She is awake, alert, oriented.  Cranial nerves II-XII are  intact.  Motor examination shows 5/5 strength throughout the upper and lower  extremities.  She has pin down her back and into her left leg.  Straight leg  raise is positive on the left.   She is getting good coverage from her stimulator.   CLINICAL IMPRESSION:  Successful trial with plans for battery implant.   PLAN:  The risks and benefits have been discussed fully and she wishes to  proceed.           ______________________________  Elizabeth Sauer, M.D.     MWR/MEDQ  D:  06/09/2005  T:  06/09/2005  Job:  [id number]

## 2010-06-20 NOTE — Op Note (Signed)
[facility]  Patient:    Marissa Scott, Marissa Scott                     MRN: [id number] Proc. Date: 11/13/99 Adm. Date:  [id number] Attending:  Jana Half                           Operative Report  PREOPERATIVE DIAGNOSIS:  Volar radial wrist ganglion right wrist.  POSTOPERATIVE DIAGNOSIS:  Volar radial wrist ganglion right wrist.  PROCEDURE:  Excision volar radial wrist ganglion right wrist.  SURGEON:  Wynonia Sours, M.D.  ASSISTANT:  R.N.  ANESTHESIA:  Upper arm IV regional.  ANESTHESIOLOGIST:  Earnest Bailey, M.D.  HISTORY:  The patient is a 45 year old female with a history of a volar radial wrist mass.  PROCEDURE:  The patient was brought to the operating room where an upper arm IV regional anesthetic was carried out without difficulty.  she was prepped and draped using Betadine scrub and solution with her right free in a supine position.  An incision was made on the volar radial aspect of the wrist, carried down through subcutaneous tissue.  Bleeders were electrocauterized. Dissection carried down to the radial artery.  This was identified and protected throughout the procedure.  The dissection was carried proximally and distally.  A deflated cyst was apparent.  At the distal margin with blunt and sharp dissection this was dissected free and was found to have a fairly thick cyst wall.  With blunt and sharp dissection this was dissected down to the radius.  It was directed along the flexor pollicis longus tendon.  The area of the carpal ligaments was opened.  The cyst excised along with stalk and sent to pathology.  The wound was irrigated.  The area of the stalk was closed with interrupted 4-0 Vicryl sutures.  The subcutaneous tissue closed with 4-0 Vicryl and the skin with interrupted 5-0 nylon sutures.  Sterile compressive dressing and splint was applied.  The patient tolerated the procedure well and was taken to the recovery  room for observation in satisfactory condition.  She is discharge home to return to the [facility] in [place] in 1 week on Vicodin and Septra DS. DD:  11/13/99 TD:  11/14/99 Job: [id number]

## 2010-06-20 NOTE — Op Note (Signed)
NAMEJOSI, Marissa Scott               ACCOUNT NO.:  [id number]   MEDICAL RECORD NO.:  [id number]          PATIENT TYPE:  AMB   LOCATION:  SDS                          FACILITY:  [facility]   PHYSICIAN:  Elizabeth Sauer, M.D.      DATE OF BIRTH:  1965-11-11   DATE OF PROCEDURE:  06/02/2005  DATE OF DISCHARGE:                                 OPERATIVE REPORT   PREOPERATIVE DIAGNOSIS:  Intractable back pain.   POSTOPERATIVE DIAGNOSIS:  Intractable back pain.   OPERATION PERFORMED:  Placement of epidural spinal cord stimulator.   SURGEON:  Elizabeth Sauer, M.D.   ANESTHESIA:  General endotracheal.   PREP:  Sterile Betadine prep and scrub with alcohol wipe.   COMPLICATIONS:  None.   INDICATIONS FOR PROCEDURE:  This is a 45 year old girl with intractable back  and leg pain.   DESCRIPTION OF PROCEDURE:  The patient was taken to the operating room,  smoothly anesthetized, intubated, and placed prone on the operating table.  Following shave, prep and drape in the usual sterile fashion, skin was  incised from mid T10 to the bottom of T11 and the laminae at T11 and T10  were exposed  bilaterally in the subperiosteal plane.  Marker was placed,  intraoperative x-ray obtained confirmed correctness of level.  Having  confirmed correctness of level, partial laminectomy of the inferior aspect  of T11 was done and using the Kerrison, the laminectomy was widened to  include the ligamentum flavum.  Epidural leads were then passed and  intraoperative x-ray showed them to be dead on in the midline covering from  mid T10 to mid T8.  Wound was irrigated.  Hemostasis was assured.  The leads  were anchored down.  They were attached to the extension wires that were  exited by trocar tunnel.  Wound was irrigated and hemostasis assured.  The  subcutaneous tissues and fascia reapproximated with 0 Vicryl in interrupted  fashion.  Subcuticular tissues reapproximated with 2-0 Vicryl in interrupted  fashion.  Skin was  closed with 3-0 nylon in running locked fashion.  Betadine Telfa dressing was applied and made occlusive with OpSite and the  patient returned to recovery room in good condition.           ______________________________  Elizabeth Sauer, M.D.     MWR/MEDQ  D:  06/02/2005  T:  06/03/2005  Job:  [id number]

## 2010-06-20 NOTE — Op Note (Signed)
Marissa Scott, Marissa Scott                           ACCOUNT NO.:  [id number]   MEDICAL RECORD NO.:  [id number]                   PATIENT TYPE:  OIB   LOCATION:  2899                                 FACILITY:  [facility]   PHYSICIAN:  Rennis Harding, M.D.                     DATE OF BIRTH:  [birth year]   DATE OF PROCEDURE:  09/06/2003  DATE OF DISCHARGE:                                 OPERATIVE REPORT   PREOPERATIVE DIAGNOSIS:  L4-5 lateral disk  herniation with left L4  radiculopathy.   POSTOPERATIVE DIAGNOSIS:  L4-5 lateral disk  herniation with left L4  radiculopathy.   OPERATION PERFORMED:  Left L4-5 microdiskectomy through an intertransverse  approach.   SURGEON:  Rennis Harding, M.D.   ASSISTANT:  Karenann Cai, P.A.   ANESTHESIA:  General endotracheal.   COMPLICATIONS:  None.   INDICATIONS FOR PROCEDURE:  The patient is a very pleasant 45 year old  female with back and left leg pain which has been refractory to conservative  care.  Plain radiographs show degenerative changes. MRI scan shows  extraforaminal disk herniation on the left at L4-5.  There is also a central  disk bulge with facet hypertrophy.  Risks and benefits of L4-5 lateral  diskectomy were reviewed with the patient.  She knows that she has  degeneration of the L4-5 disk with a component of lateral recess narrowing  and a central disk bulge.  This may continue to be a problem for her and  require additional surgery.  Nevertheless, she is currently unable to  tolerate her current symptoms and would like to move ahead of surgery in  hopes of improving her situation.   DESCRIPTION OF PROCEDURE:  The patient was properly identified in the  holding area and taken to the operating room, underwent general endotracheal  anesthesia without difficulty.  She was given prophylactic IV antibiotics  and very carefully turned prone onto the Wilson frame.  All bony prominences  padded.  Face and eyes were protected at all times.  Back  prepped and draped  in the usual sterile fashion.  Fluoroscopy brought into the field.  The L4-5  interspace was identified on lateral fluoroscopy.  A 3 cm incision was made  off the midline, left side.  The deep fascia was incised.  Dilators from the  [place] retractor system were used to dock onto the L4-5 facet joint.  The  Maxcess retractor was then placed utilizing the 80 mm blades and expanded.  The retractor was attached to the table using the attachment arm.  The transverse processes of L4 and L5 were identified.  The pars  interarticularis was defined.  A thin layer of paraspinal muscle was removed  over the intertransverse membrane.  At this point the microscope was draped  and brought into the field.  The intertransverse membrane was elevated.  The  L4 nerve root was identified.  The nerve root was gently mobilized laterally  and the disk became immediately visible. There was a focal disk protrusion.  The annulus was incised using a 15 blade.  Pituitary rongeurs were used to  remove several large disk fragments.  When we were done, we felt we had a  good decompression of the foramen and extraforaminal zone.  The wound was  irrigated.  Bleeding was controlled with bipolar electrocautery and Gelfoam.  2 mL of fentanyl were left over the  nerve root.  The deep fascia closed with 0 Vicryl, subcutaneous layer closed  with 2-0 Vicryl followed by Dermabond to approximate the skin edges.  The  patient was turned supine, extubated without difficulty and transported to  the recovery room in stable condition, able to move the upper and lower  extremities.                                               Rennis Harding, M.D.    MC/MEDQ  D:  09/06/2003  T:  09/07/2003  Job:  [id number]

## 2010-07-31 ENCOUNTER — Other Ambulatory Visit: Payer: Self-pay | Admitting: Family

## 2010-07-31 NOTE — Telephone Encounter (Signed)
Pt last seen by Dr Birdie Riddle 09/10/09, no future appt on file for Dr Birdie Riddle. Please advise re: refills.

## 2010-11-03 ENCOUNTER — Other Ambulatory Visit: Payer: Self-pay | Admitting: Endocrinology

## 2010-11-18 ENCOUNTER — Other Ambulatory Visit: Payer: Self-pay | Admitting: Gynecology

## 2010-11-18 DIAGNOSIS — Z1231 Encounter for screening mammogram for malignant neoplasm of breast: Secondary | ICD-10-CM

## 2010-11-26 ENCOUNTER — Encounter: Payer: Self-pay | Admitting: *Deleted

## 2010-12-02 ENCOUNTER — Ambulatory Visit (INDEPENDENT_AMBULATORY_CARE_PROVIDER_SITE_OTHER): Payer: Medicare Other | Admitting: Endocrinology

## 2010-12-02 ENCOUNTER — Other Ambulatory Visit (INDEPENDENT_AMBULATORY_CARE_PROVIDER_SITE_OTHER): Payer: Medicare Other

## 2010-12-02 ENCOUNTER — Encounter: Payer: Self-pay | Admitting: Endocrinology

## 2010-12-02 VITALS — BP 118/84 | HR 89 | Temp 97.4°F | Resp 16 | Wt 192.8 lb

## 2010-12-02 DIAGNOSIS — E119 Type 2 diabetes mellitus without complications: Secondary | ICD-10-CM

## 2010-12-02 LAB — BASIC METABOLIC PANEL
BUN: 30 mg/dL — ABNORMAL HIGH (ref 6–23)
Creatinine, Ser: 1.7 mg/dL — ABNORMAL HIGH (ref 0.4–1.2)
GFR: 33.41 mL/min — ABNORMAL LOW (ref >60.00)
Potassium: 5.6 mEq/L — ABNORMAL HIGH (ref 3.5–5.1)

## 2010-12-02 LAB — HEMOGLOBIN A1C: Hgb A1c MFr Bld: 7.1 % — ABNORMAL HIGH (ref 4.6–6.5)

## 2010-12-02 MED ORDER — SITAGLIPTIN PHOSPHATE 100 MG PO TABS: ORAL_TABLET | ORAL | Status: DC

## 2010-12-02 NOTE — Patient Instructions (Addendum)
blood tests are being requested for you today.  please call [phone] to hear your test results.  You will be prompted to enter the 9-digit "MRN" number that appears at the top left of this page, followed by #.  Then you will hear the message. When i know the test results, i will probably prescribe metformin, to add to the actos.   good diet and exercise habits significanly improve the control of your diabetes.  please let me know if you wish to be referred to a dietician.  high blood sugar is very risky to your health.  you should see an eye doctor every year. controlling your blood pressure and cholesterol drastically reduces the damage diabetes does to your body.  this also applies to quitting smoking.  please discuss these with your doctor.  you should take an aspirin every day, unless you have been advised by a doctor not to. Please make a follow-up appointment in 6 months.   (update: i left message on phone-tree:  Add januvia 50 mg qam).

## 2010-12-02 NOTE — Progress Notes (Signed)
  Subjective:    Patient ID: Marissa Scott, female    DOB: 10-23-1965, 45 y.o.   MRN: [id number]  HPI pt states she feels well in general.  She does not check cbg's.   Past Medical History  Diagnosis Date  . RENAL DISEASE, CHRONIC, STAGE III 01/30/2008    Follows w/ Renal  . Polycystic ovaries 01/04/2009  . HYPERURICEMIA 10/01/2008  . HYPERTENSION 01/30/2008  . HYPERLIPIDEMIA 01/30/2008  . DIABETES MELLITUS, TYPE II 01/30/2008    Past Surgical History  Procedure Date  . Lumbar laminectomy     L4-L5  . Nephrectomy     left removed, partial right-Ottelin  . Appendectomy   . Tubal ligation     GYN Mezer  . Right hand cyst   . Neural stimulator     History   Social History  . Marital Status: Married    Spouse Name: N/A    Number of Children: N/A  . Years of Education: N/A   Occupational History  . Disabled    Social History Main Topics  . Smoking status: Never Smoker   . Smokeless tobacco: Not on file  . Alcohol Use: No  . Drug Use: Not on file  . Sexually Active: Not on file   Other Topics Concern  . Not on file   Social History Narrative   Disabled for back pain    Current Outpatient Prescriptions on File Prior to Visit  Medication Sig Dispense Refill  . ACTOS 45 MG tablet TAKE ONE TABLET BY MOUTH EVERY DAY  30 each  6  . enalapril (VASOTEC) 10 MG tablet Take 10 mg by mouth 2 (two) times daily.        . fenofibrate 160 MG tablet TAKE ONE TABLET BY MOUTH EVERY DAY  30 tablet  3  . furosemide (LASIX) 20 MG tablet Take 20 mg by mouth as needed.          Allergies  Allergen Reactions  . Penicillins     Family History  Problem Relation Age of Onset  . Hypertension Father   . Heart disease Father     CAD  . Diabetes Father   . Stroke Father   . Cancer Father     Colon Cancer    BP 118/84  Pulse 89  Temp(Src) 97.4 F (36.3 C) (Oral)  Resp 16  Wt 192 lb 12 oz (87.431 kg)  SpO2 99%  LMP 11/13/2010   Review of Systems Denies weight change.   Objective:   Physical Exam VITAL SIGNS:  See vs page GENERAL: no distress PSYCH: Alert and oriented x 3.  Does not appear anxious nor depressed.   Lab Results  Component Value Date   HGBA1C 7.1* 12/02/2010      Assessment & Plan:  DM:  She needs increased rx, if it can be done with a regimen that avoids or minimizes hypoglycemia.

## 2010-12-04 ENCOUNTER — Ambulatory Visit
Admission: RE | Admit: 2010-12-04 | Discharge: 2010-12-04 | Disposition: A | Discharge status: Home / Self Care | Payer: Medicare Other | Source: Ambulatory Visit | Attending: Gynecology | Admitting: Gynecology

## 2010-12-04 DIAGNOSIS — Z1231 Encounter for screening mammogram for malignant neoplasm of breast: Secondary | ICD-10-CM

## 2011-01-12 ENCOUNTER — Telehealth: Payer: Self-pay

## 2011-01-12 MED ORDER — SAXAGLIPTIN HCL 2.5 MG PO TABS: 2.5000 mg | ORAL_TABLET | Freq: Every day | ORAL | Status: DC

## 2011-01-12 NOTE — Telephone Encounter (Signed)
Pt informed of new rx and of MD's advisement.

## 2011-01-12 NOTE — Telephone Encounter (Signed)
An alternative to [place] is "onglyza."  i sent rx.  You need the actos also, so please continue.

## 2011-01-12 NOTE — Telephone Encounter (Signed)
Pt called stating she has been experiencing SE with [place]. C/o severe total body swelling making it painful to walk. Pt is requesting to D/C Januvia and go back to Actos, she has refills remaining, please advise.

## 2011-05-06 ENCOUNTER — Emergency Department (HOSPITAL_COMMUNITY)
Admission: EM | Admit: 2011-05-06 | Discharge: 2011-05-06 | Disposition: A | Discharge status: Home / Self Care | Payer: Self-pay | Source: Home / Self Care | Attending: Family Medicine | Admitting: Family Medicine

## 2011-05-06 ENCOUNTER — Emergency Department (INDEPENDENT_AMBULATORY_CARE_PROVIDER_SITE_OTHER): Payer: Medicare Other

## 2011-05-06 ENCOUNTER — Encounter (HOSPITAL_COMMUNITY): Payer: Self-pay | Admitting: *Deleted

## 2011-05-06 DIAGNOSIS — S60229A Contusion of unspecified hand, initial encounter: Secondary | ICD-10-CM

## 2011-05-06 DIAGNOSIS — S60221A Contusion of right hand, initial encounter: Secondary | ICD-10-CM

## 2011-05-06 NOTE — ED Notes (Signed)
Reports hitting right posterior hand against corner of metal box approx 2 weeks ago, then hit same area of hand yesterday on a dresser.  C/O increased pain and swelling today.  Has been taking her Percocet, Tylenol, and Advil.

## 2011-05-06 NOTE — Discharge Instructions (Signed)
Warm soak of hand for 15 minutes twice a day  as needed, wear splint for comfort, advil for soreness. See orthopedist if further problems

## 2011-05-06 NOTE — ED Provider Notes (Signed)
History     CSN: [id number]  Arrival date & time 05/06/11  1916   First MD Initiated Contact with Patient 05/06/11 1918      Chief Complaint  Patient presents with  . Hand Pain    (Consider location/radiation/quality/duration/timing/severity/associated sxs/prior treatment) Patient is a 46 y.o. female presenting with hand pain. The history is provided by the patient and the spouse.  Hand Pain This is a new problem. The current episode started more than 1 week ago (has struck right hand on hard object twice in past 2 weeks, now swollen.). The problem has been gradually worsening.    Past Medical History  Diagnosis Date  . RENAL DISEASE, CHRONIC, STAGE III 01/30/2008    Follows w/ Renal  . Polycystic ovaries 01/04/2009  . HYPERURICEMIA 10/01/2008  . HYPERTENSION 01/30/2008  . HYPERLIPIDEMIA 01/30/2008  . DIABETES MELLITUS, TYPE II 01/30/2008    Past Surgical History  Procedure Date  . Lumbar laminectomy     L4-L5  . Nephrectomy     left removed, partial right-Ottelin  . Appendectomy   . Tubal ligation     GYN Mezer  . Right hand cyst   . Neural stimulator     Family History  Problem Relation Age of Onset  . Hypertension Father   . Heart disease Father     CAD  . Diabetes Father   . Stroke Father   . Cancer Father     Colon Cancer    History  Substance Use Topics  . Smoking status: Never Smoker   . Smokeless tobacco: Not on file  . Alcohol Use: No    OB History    Grav Para Term Preterm Abortions TAB SAB Ect Mult Living                  Review of Systems  Musculoskeletal: Positive for joint swelling.    Allergies  Penicillins  Home Medications   Current Outpatient Rx  Name Route Sig Dispense Refill  . ACTOS 45 MG PO TABS  TAKE ONE TABLET BY MOUTH EVERY DAY 30 each 6  . ENALAPRIL MALEATE 10 MG PO TABS Oral Take 5 mg by mouth 2 (two) times daily.     . FUROSEMIDE 20 MG PO TABS Oral Take 20 mg by mouth as needed.      Marland Kitchen PERCOCET PO Oral Take by  mouth.    . FENOFIBRATE 160 MG PO TABS  TAKE ONE TABLET BY MOUTH EVERY DAY 30 tablet 3  . RED YEAST RICE PO Oral Take 1-2 each by mouth daily.      Marland Kitchen SAXAGLIPTIN HCL 2.5 MG PO TABS Oral Take 1 tablet (2.5 mg total) by mouth daily. 30 tablet 11    BP 123/85  Pulse 124  Temp(Src) 97.8 F (36.6 C) (Oral)  Resp 18  SpO2 97%  LMP 04/22/2011  Physical Exam  Nursing note and vitals reviewed. Constitutional: She appears well-developed and well-nourished.  Musculoskeletal: She exhibits tenderness.       Hands: Skin: Skin is warm and dry.    ED Course  Procedures (including critical care time)  Labs Reviewed - No data to display Dg Hand Complete Right  05/06/2011  *RADIOLOGY REPORT*  Clinical Data: Pain post repeat injury  RIGHT HAND - COMPLETE 3+ VIEW  Comparison: None.  Findings: Three views of the right hand submitted.  No acute fracture or subluxation.  Mild soft tissue swelling dorsal metacarpal region.  No radiopaque foreign body.  IMPRESSION: No  acute fracture or subluxation.  Original Report Authenticated By: Lahoma Crocker, M.D.     1. Contusion of right hand       MDM  X-rays reviewed and report per radiologist.         Billy Fischer, MD 05/06/11 2033

## 2011-06-02 ENCOUNTER — Encounter: Payer: Self-pay | Admitting: Endocrinology

## 2011-06-02 ENCOUNTER — Ambulatory Visit (INDEPENDENT_AMBULATORY_CARE_PROVIDER_SITE_OTHER): Payer: Medicare Other | Admitting: Endocrinology

## 2011-06-02 ENCOUNTER — Other Ambulatory Visit (INDEPENDENT_AMBULATORY_CARE_PROVIDER_SITE_OTHER): Payer: Medicare Other

## 2011-06-02 VITALS — BP 132/92 | HR 87 | Temp 97.5°F | Ht 60.0 in | Wt 175.0 lb

## 2011-06-02 DIAGNOSIS — E1129 Type 2 diabetes mellitus with other diabetic kidney complication: Secondary | ICD-10-CM

## 2011-06-02 DIAGNOSIS — N058 Unspecified nephritic syndrome with other morphologic changes: Secondary | ICD-10-CM

## 2011-06-02 DIAGNOSIS — E1122 Type 2 diabetes mellitus with diabetic chronic kidney disease: Secondary | ICD-10-CM | POA: Insufficient documentation

## 2011-06-02 LAB — HEMOGLOBIN A1C: Hgb A1c MFr Bld: 8.1 % — ABNORMAL HIGH (ref 4.6–6.5)

## 2011-06-02 LAB — BASIC METABOLIC PANEL
CO2: 23 mEq/L (ref 19–32)
Calcium: 10.4 mg/dL (ref 8.4–10.5)
GFR: 51.68 mL/min — ABNORMAL LOW (ref >60.00)
Sodium: 138 mEq/L (ref 135–145)

## 2011-06-02 MED ORDER — PIOGLITAZONE HCL 45 MG PO TABS: 45.0000 mg | ORAL_TABLET | Freq: Every day | ORAL | Status: DC

## 2011-06-02 NOTE — Patient Instructions (Addendum)
blood tests are being requested for you today.  You will receive a letter with results. i have sent a prescription for actos to [facility], where it is cheap.   Here are some samples of onglyza.   Please come back for a follow-up appointment in 6 months.

## 2011-06-02 NOTE — Progress Notes (Signed)
  Subjective:    Patient ID: Marissa Scott, female    DOB: 1965/09/03, 46 y.o.   MRN: [id number]  HPI pt returns for f/u of type 2 DM (dx'ed [id number], complicated by renal insufficiency).  She says she feels well in general.  She does not check cbg's.  She says insurance won't allow the actos and onglyza to be filled at the same day, so she has been alternating them.  Past Medical History  Diagnosis Date  . RENAL DISEASE, CHRONIC, STAGE III 01/30/2008    Follows w/ Renal  . Polycystic ovaries 01/04/2009  . HYPERURICEMIA 10/01/2008  . HYPERTENSION 01/30/2008  . HYPERLIPIDEMIA 01/30/2008  . DIABETES MELLITUS, TYPE II 01/30/2008    Past Surgical History  Procedure Date  . Lumbar laminectomy     L4-L5  . Nephrectomy     left removed, partial right-Ottelin  . Appendectomy   . Tubal ligation     GYN Mezer  . Right hand cyst   . Neural stimulator     History   Social History  . Marital Status: Married    Spouse Name: N/A    Number of Children: N/A  . Years of Education: N/A   Occupational History  . Disabled    Social History Main Topics  . Smoking status: Never Smoker   . Smokeless tobacco: Not on file  . Alcohol Use: No  . Drug Use: Not on file  . Sexually Active: Not on file   Other Topics Concern  . Not on file   Social History Narrative   Disabled for back pain    Current Outpatient Prescriptions on File Prior to Visit  Medication Sig Dispense Refill  . enalapril (VASOTEC) 10 MG tablet Take 5 mg by mouth 2 (two) times daily.       . furosemide (LASIX) 20 MG tablet Take 20 mg by mouth as needed.        . Oxycodone-Acetaminophen (PERCOCET PO) Take by mouth.      . pioglitazone (ACTOS) 45 MG tablet Take 1 tablet (45 mg total) by mouth daily.  90 tablet  3  . saxagliptin HCl (ONGLYZA) 2.5 MG TABS tablet Take 1 tablet (2.5 mg total) by mouth daily.  30 tablet  11  . fenofibrate 160 MG tablet TAKE ONE TABLET BY MOUTH EVERY DAY  30 tablet  3    Allergies  Allergen  Reactions  . Penicillins     Family History  Problem Relation Age of Onset  . Hypertension Father   . Heart disease Father     CAD  . Diabetes Father   . Stroke Father   . Cancer Father     Colon Cancer    BP 132/92  Pulse 87  Temp(Src) 97.5 F (36.4 C) (Oral)  Ht 5' (1.524 m)  Wt 175 lb (79.379 kg)  BMI 34.18 kg/m2  SpO2 96%  LMP 04/22/2011   Review of Systems She has lost weight, due to her efforts.    Objective:   Physical Exam VITAL SIGNS:  See vs page GENERAL: no distress Pulses: dorsalis pedis intact bilat.   Feet: no deformity.  no ulcer on the feet.  feet are of normal color and temp.  no edema Neuro: sensation is intact to touch on the feet  Lab Results  Component Value Date   HGBA1C 8.1* 06/02/2011      Assessment & Plan:  DM.  She needs to take meds consistently. HTN is noted today

## 2011-06-03 ENCOUNTER — Telehealth: Payer: Self-pay | Admitting: *Deleted

## 2011-06-03 NOTE — Telephone Encounter (Signed)
Called pt to inform of lab results, left message for pt to callback office (letter also mailed to pt). 

## 2011-06-04 NOTE — Telephone Encounter (Signed)
Pt informed of lab results. 

## 2011-10-26 ENCOUNTER — Other Ambulatory Visit: Payer: Self-pay | Admitting: Neurosurgery

## 2011-10-26 DIAGNOSIS — M47817 Spondylosis without myelopathy or radiculopathy, lumbosacral region: Secondary | ICD-10-CM

## 2011-10-27 ENCOUNTER — Ambulatory Visit
Admission: RE | Admit: 2011-10-27 | Discharge: 2011-10-27 | Disposition: A | Discharge status: Home / Self Care | Payer: Medicare Other | Source: Ambulatory Visit | Attending: Neurosurgery | Admitting: Neurosurgery

## 2011-10-27 DIAGNOSIS — M47817 Spondylosis without myelopathy or radiculopathy, lumbosacral region: Secondary | ICD-10-CM

## 2011-10-29 ENCOUNTER — Other Ambulatory Visit: Payer: Self-pay | Admitting: Neurosurgery

## 2011-10-29 DIAGNOSIS — M47816 Spondylosis without myelopathy or radiculopathy, lumbar region: Secondary | ICD-10-CM

## 2011-11-02 ENCOUNTER — Ambulatory Visit
Admission: RE | Admit: 2011-11-02 | Discharge: 2011-11-02 | Disposition: A | Discharge status: Home / Self Care | Payer: Medicare Other | Source: Ambulatory Visit | Attending: Neurosurgery | Admitting: Neurosurgery

## 2011-11-02 VITALS — BP 185/90 | HR 87

## 2011-11-02 DIAGNOSIS — M47816 Spondylosis without myelopathy or radiculopathy, lumbar region: Secondary | ICD-10-CM

## 2011-11-02 MED ORDER — METHYLPREDNISOLONE ACETATE 40 MG/ML INJ SUSP (RADIOLOG
120.0000 mg | Freq: Once | INTRAMUSCULAR | Status: AC
  Administered 2011-11-02: 120 mg via EPIDURAL

## 2011-11-02 MED ORDER — IOHEXOL 180 MG/ML  SOLN
1.0000 mL | Freq: Once | INTRAMUSCULAR | Status: AC | PRN
  Administered 2011-11-02: 1 mL via INTRA_ARTICULAR

## 2011-12-01 ENCOUNTER — Ambulatory Visit: Payer: Medicare Other | Admitting: Endocrinology

## 2011-12-18 ENCOUNTER — Other Ambulatory Visit: Payer: Self-pay | Admitting: Gynecology

## 2011-12-18 DIAGNOSIS — Z1231 Encounter for screening mammogram for malignant neoplasm of breast: Secondary | ICD-10-CM

## 2012-01-21 ENCOUNTER — Ambulatory Visit (INDEPENDENT_AMBULATORY_CARE_PROVIDER_SITE_OTHER): Payer: Medicare Other | Admitting: Endocrinology

## 2012-01-21 ENCOUNTER — Encounter: Payer: Self-pay | Admitting: Endocrinology

## 2012-01-21 VITALS — BP 130/76 | HR 80 | Temp 97.8°F | Wt 176.0 lb

## 2012-01-21 DIAGNOSIS — E1129 Type 2 diabetes mellitus with other diabetic kidney complication: Secondary | ICD-10-CM

## 2012-01-21 DIAGNOSIS — N183 Chronic kidney disease, stage 3 unspecified: Secondary | ICD-10-CM

## 2012-01-21 LAB — BASIC METABOLIC PANEL
BUN: 24 mg/dL — ABNORMAL HIGH (ref 6–23)
Calcium: 9.6 mg/dL (ref 8.4–10.5)
Glucose, Bld: 176 mg/dL — ABNORMAL HIGH (ref 70–99)

## 2012-01-21 LAB — HEMOGLOBIN A1C: Mean Plasma Glucose: 148 mg/dL — ABNORMAL HIGH (ref <117)

## 2012-01-21 NOTE — Progress Notes (Signed)
  Subjective:    Patient ID: Marissa Scott, female    DOB: 08-10-1965, 46 y.o.   MRN: [id number]  HPI pt returns for f/u of type 2 DM (dx'ed [id number], complicated by renal insufficiency).  She says she feels well in general.  She does not check cbg's.  She takes DM meds inconsistently.  She takes prednisone for low-back pain. Past Medical History  Diagnosis Date  . RENAL DISEASE, CHRONIC, STAGE III 01/30/2008    Follows w/ Renal  . Polycystic ovaries 01/04/2009  . HYPERURICEMIA 10/01/2008  . HYPERTENSION 01/30/2008  . HYPERLIPIDEMIA 01/30/2008  . DIABETES MELLITUS, TYPE II 01/30/2008    Past Surgical History  Procedure Date  . Lumbar laminectomy     L4-L5  . Nephrectomy     left removed, partial right-Ottelin  . Appendectomy   . Tubal ligation     GYN Mezer  . Right hand cyst   . Neural stimulator     History   Social History  . Marital Status: Married    Spouse Name: N/A    Number of Children: N/A  . Years of Education: N/A   Occupational History  . Disabled    Social History Main Topics  . Smoking status: Never Smoker   . Smokeless tobacco: Not on file  . Alcohol Use: No  . Drug Use: Not on file  . Sexually Active: Not on file   Other Topics Concern  . Not on file   Social History Narrative   Disabled for back pain    Current Outpatient Prescriptions on File Prior to Visit  Medication Sig Dispense Refill  . enalapril (VASOTEC) 10 MG tablet Take 5 mg by mouth 2 (two) times daily.       . fenofibrate 160 MG tablet TAKE ONE TABLET BY MOUTH EVERY DAY  30 tablet  3  . furosemide (LASIX) 20 MG tablet Take 20 mg by mouth as needed.        . Oxycodone-Acetaminophen (PERCOCET PO) Take by mouth.      . pioglitazone (ACTOS) 45 MG tablet Take 1 tablet (45 mg total) by mouth daily.  90 tablet  3  . saxagliptin HCl (ONGLYZA) 2.5 MG TABS tablet Take 1 tablet (2.5 mg total) by mouth daily.  30 tablet  11    Allergies  Allergen Reactions  . Penicillins     Family  History  Problem Relation Age of Onset  . Hypertension Father   . Heart disease Father     CAD  . Diabetes Father   . Stroke Father   . Cancer Father     Colon Cancer    BP 130/76  Pulse 80  Temp 97.8 F (36.6 C) (Oral)  Wt 176 lb (79.833 kg)  SpO2 98%  Review of Systems No weight change    Objective:   Physical Exam Pulses: dorsalis pedis intact bilat.   Feet: no deformity.  no ulcer on the feet.  feet are of normal color and temp.  no edema Neuro: sensation is intact to touch on the feet Lab Results  Component Value Date   HGBA1C 6.8* 01/21/2012      Assessment & Plan:  DM, well-controlled.

## 2012-01-21 NOTE — Patient Instructions (Addendum)
blood tests are being requested for you today.  We'll contact you with results. Please see dr Birdie Riddle soon, as you are due for a checkup. Please come back for a follow-up appointment in 6 months.   good diet and exercise habits significanly improve the control of your diabetes.  please let me know if you wish to be referred to a dietician.  high blood sugar is very risky to your health.  you should see an eye doctor every year.  You are at higher than average risk for pneumonia and hepatitis-B.  You should be vaccinated against both.   controlling your blood pressure and cholesterol drastically reduces the damage diabetes does to your body.  this also applies to quitting smoking.  please discuss these with your doctor.  you should take an aspirin every day, unless you have been advised by a doctor not to.

## 2012-01-22 LAB — MICROALBUMIN / CREATININE URINE RATIO: Microalb, Ur: 180.07 mg/dL — ABNORMAL HIGH (ref 0.00–1.89)

## 2012-01-26 ENCOUNTER — Ambulatory Visit
Admission: RE | Admit: 2012-01-26 | Discharge: 2012-01-26 | Disposition: A | Discharge status: Home / Self Care | Payer: Medicare Other | Source: Ambulatory Visit | Attending: Gynecology | Admitting: Gynecology

## 2012-01-26 DIAGNOSIS — Z1231 Encounter for screening mammogram for malignant neoplasm of breast: Secondary | ICD-10-CM

## 2012-02-08 ENCOUNTER — Other Ambulatory Visit: Payer: Self-pay | Admitting: Neurosurgery

## 2012-02-08 DIAGNOSIS — M47816 Spondylosis without myelopathy or radiculopathy, lumbar region: Secondary | ICD-10-CM

## 2012-02-09 ENCOUNTER — Ambulatory Visit
Admission: RE | Admit: 2012-02-09 | Discharge: 2012-02-09 | Disposition: A | Discharge status: Home / Self Care | Payer: Medicare Other | Source: Ambulatory Visit | Attending: Neurosurgery | Admitting: Neurosurgery

## 2012-02-09 DIAGNOSIS — M47816 Spondylosis without myelopathy or radiculopathy, lumbar region: Secondary | ICD-10-CM

## 2012-02-09 MED ORDER — IOHEXOL 180 MG/ML  SOLN
1.0000 mL | Freq: Once | INTRAMUSCULAR | Status: AC | PRN
  Administered 2012-02-09: 1 mL via INTRA_ARTICULAR

## 2012-02-09 MED ORDER — METHYLPREDNISOLONE ACETATE 40 MG/ML INJ SUSP (RADIOLOG
120.0000 mg | Freq: Once | INTRAMUSCULAR | Status: AC
  Administered 2012-02-09: 120 mg via INTRA_ARTICULAR

## 2012-05-12 ENCOUNTER — Telehealth: Payer: Self-pay | Admitting: Family Medicine

## 2012-05-12 NOTE — Telephone Encounter (Signed)
Patient Information:  Caller Name: Zehava  Phone: [phone]  Patient: Marissa Scott, Marissa Scott  Gender: Female  DOB: 12/16/1965  Age: 47 Years  PCP: Midge Minium  Pregnant: No  Office Follow Up:  Does the office need to follow up with this patient?: No  Instructions For The Office: N/A  RN Note:  Patient calls with complaints of burning and urgency.  Is taking an OTC product that helps some but not completely.  Patient states is going out of town so will not be able to come in tomorrow-no appointments available this evening.  Patient states she will go to urgent care.  Call came in at 16:15  Symptoms  Reason For Call & Symptoms: Believes she has a UTI; Is burning and urgency  Reviewed Health History In EMR: Yes  Reviewed Medications In EMR: Yes  Reviewed Allergies In EMR: Yes  Reviewed Surgeries / Procedures: Yes  Date of Onset of Symptoms: 05/11/2012  Treatments Tried: Bladder numbing product-helps  Treatments Tried Worked: Yes OB / GYN:  LMP: Unknown  Guideline(s) Used:  Urination Pain - Female  Disposition Per Guideline:   See Today in Office  Reason For Disposition Reached:   All other females with painful urination, or patient wants to be seen  Advice Given:  N/A  RN Overrode Recommendation:  Go To [facility]  No appointments available.

## 2012-07-12 ENCOUNTER — Other Ambulatory Visit: Payer: Self-pay | Admitting: Neurosurgery

## 2012-07-12 DIAGNOSIS — M47816 Spondylosis without myelopathy or radiculopathy, lumbar region: Secondary | ICD-10-CM

## 2012-07-13 ENCOUNTER — Ambulatory Visit
Admission: RE | Admit: 2012-07-13 | Discharge: 2012-07-13 | Disposition: A | Discharge status: Home / Self Care | Payer: Medicare Other | Source: Ambulatory Visit | Attending: Neurosurgery | Admitting: Neurosurgery

## 2012-07-13 DIAGNOSIS — M47816 Spondylosis without myelopathy or radiculopathy, lumbar region: Secondary | ICD-10-CM

## 2012-07-13 MED ORDER — METHYLPREDNISOLONE ACETATE 40 MG/ML INJ SUSP (RADIOLOG
120.0000 mg | Freq: Once | INTRAMUSCULAR | Status: AC
  Administered 2012-07-13: 120 mg via INTRA_ARTICULAR

## 2012-07-13 MED ORDER — IOHEXOL 180 MG/ML  SOLN
1.0000 mL | Freq: Once | INTRAMUSCULAR | Status: AC | PRN
  Administered 2012-07-13: 1 mL via INTRA_ARTICULAR

## 2012-07-18 ENCOUNTER — Ambulatory Visit (INDEPENDENT_AMBULATORY_CARE_PROVIDER_SITE_OTHER): Payer: Medicare Other | Admitting: Endocrinology

## 2012-07-18 ENCOUNTER — Other Ambulatory Visit: Payer: Self-pay | Admitting: Endocrinology

## 2012-07-18 VITALS — BP 160/92 | HR 82 | Ht 60.0 in | Wt 177.0 lb

## 2012-07-18 DIAGNOSIS — E1129 Type 2 diabetes mellitus with other diabetic kidney complication: Secondary | ICD-10-CM

## 2012-07-18 MED ORDER — GLIMEPIRIDE 4 MG PO TABS: 4.0000 mg | ORAL_TABLET | Freq: Every day | ORAL | Status: DC

## 2012-07-18 NOTE — Progress Notes (Signed)
  Subjective:    Patient ID: Marissa Scott, female    DOB: 1965/11/28, 47 y.o.   MRN: [id number]  HPI  pt returns for f/u of type 2 DM (dx'ed 2000; she has mild if any neuropathy of the lower extremities; she has associated renal insufficiency).  She says she feels well in general, except for chronic low-back pain.  She does not check cbg's.  She feels as though the actos is causing arthralgias.   Past Medical History  Diagnosis Date  . RENAL DISEASE, CHRONIC, STAGE III 01/30/2008    Follows w/ Renal  . Polycystic ovaries 01/04/2009  . HYPERURICEMIA 10/01/2008  . HYPERTENSION 01/30/2008  . HYPERLIPIDEMIA 01/30/2008  . DIABETES MELLITUS, TYPE II 01/30/2008    Past Surgical History  Procedure Laterality Date  . Lumbar laminectomy      L4-L5  . Nephrectomy      left removed, partial right-Ottelin  . Appendectomy    . Tubal ligation      GYN Mezer  . Right hand cyst    . Neural stimulator      History   Social History  . Marital Status: Married    Spouse Name: N/A    Number of Children: N/A  . Years of Education: N/A   Occupational History  . Disabled    Social History Main Topics  . Smoking status: Never Smoker   . Smokeless tobacco: Not on file  . Alcohol Use: No  . Drug Use: Not on file  . Sexually Active: Not on file   Other Topics Concern  . Not on file   Social History Narrative   Disabled for back pain    Current Outpatient Prescriptions on File Prior to Visit  Medication Sig Dispense Refill  . enalapril (VASOTEC) 10 MG tablet Take 5 mg by mouth 2 (two) times daily.       . fenofibrate 160 MG tablet TAKE ONE TABLET BY MOUTH EVERY DAY  30 tablet  3  . furosemide (LASIX) 20 MG tablet Take 20 mg by mouth as needed.        . Oxycodone-Acetaminophen (PERCOCET PO) Take by mouth.      . saxagliptin HCl (ONGLYZA) 2.5 MG TABS tablet Take 1 tablet (2.5 mg total) by mouth daily.  30 tablet  11   No current facility-administered medications on file prior to visit.     Allergies  Allergen Reactions  . Penicillins Rash    Family History  Problem Relation Age of Onset  . Hypertension Father   . Heart disease Father     CAD  . Diabetes Father   . Stroke Father   . Cancer Father     Colon Cancer    BP 160/92  Pulse 82  Ht 5' (1.524 m)  Wt 177 lb (80.287 kg)  BMI 34.57 kg/m2  SpO2 97%   Review of Systems denies hypoglycemia and weight change    Objective:   Physical Exam VITAL SIGNS:  See vs page GENERAL: no distress      Assessment & Plan:  DM: We discussed the nine oral agents available for type 2 diabetes.  This regimen gives the best risk-benefit ratio, but she perceives that the actos is exacerbating her chronic pain syndrome.

## 2012-07-18 NOTE — Patient Instructions (Addendum)
blood tests are being requested for you today.  We'll contact you with results.  Please stop taking the actos. Please see dr Birdie Riddle soon, as you are due for a checkup. Please come back for a follow-up appointment in 3 months.   check your blood sugar once a day.  vary the time of day when you check, between before the 3 meals, and at bedtime.  also check if you have symptoms of your blood sugar being too high or too low.  please keep a record of the readings and bring it to your next appointment here.  please call us sooner if your blood sugar goes below 70, or if you have a lot of readings over 200.

## 2012-08-22 ENCOUNTER — Encounter: Payer: Self-pay | Admitting: Family Medicine

## 2012-08-22 ENCOUNTER — Ambulatory Visit (INDEPENDENT_AMBULATORY_CARE_PROVIDER_SITE_OTHER): Payer: Medicare Other | Admitting: Family Medicine

## 2012-08-22 VITALS — BP 180/98 | HR 89 | Temp 98.3°F | Ht 60.75 in | Wt 177.2 lb

## 2012-08-22 DIAGNOSIS — L988 Other specified disorders of the skin and subcutaneous tissue: Secondary | ICD-10-CM

## 2012-08-22 DIAGNOSIS — I10 Essential (primary) hypertension: Secondary | ICD-10-CM

## 2012-08-22 DIAGNOSIS — T148XXA Other injury of unspecified body region, initial encounter: Secondary | ICD-10-CM

## 2012-08-22 MED ORDER — NEBIVOLOL HCL 10 MG PO TABS: 10.0000 mg | ORAL_TABLET | Freq: Every day | ORAL | Status: DC

## 2012-08-22 NOTE — Patient Instructions (Addendum)
Follow up in 1 month to recheck BP Continue the Enalapril daily ADD the Bystolic 1 tab daily We'll notify you of your lab results Call with any questions or concerns Hang in there!

## 2012-08-22 NOTE — Progress Notes (Signed)
  Subjective:    Patient ID: Marissa Scott, female    DOB: Nov 14, 1965, 47 y.o.   MRN: [id number]  HPI HTN- chronic problem, on Enalapril and lasix.  Poor control.  Pt under a lot of stress w/ son's head injury.  No CP, SOB, HAs, visual changes.  + edema.  Blister- R great toe, hit it last night while getting into bed.  This AM woke w/ black spot on side of toe, thought it was a tick.  No pain.   Review of Systems For ROS see HPI     Objective:   Physical Exam  Vitals reviewed. Constitutional: She is oriented to person, place, and time. She appears well-developed and well-nourished. No distress.  HENT:  Head: Normocephalic and atraumatic.  Eyes: Conjunctivae and EOM are normal. Pupils are equal, round, and reactive to light.  Neck: Normal range of motion. Neck supple. No thyromegaly present.  Cardiovascular: Normal rate, regular rhythm, normal heart sounds and intact distal pulses.   No murmur heard. Pulmonary/Chest: Effort normal and breath sounds normal. No respiratory distress.  Abdominal: Soft. She exhibits no distension. There is no tenderness.  Musculoskeletal: She exhibits no edema and no tenderness.  Lymphadenopathy:    She has no cervical adenopathy.  Neurological: She is alert and oriented to person, place, and time.  Skin: Skin is warm and dry.  Blood blister on R great toe medially w/out evidence of infection  Psychiatric: She has a normal mood and affect. Her behavior is normal.          Assessment & Plan:

## 2012-08-23 LAB — BASIC METABOLIC PANEL
BUN: 15 mg/dL (ref 6–23)
CO2: 24 mEq/L (ref 19–32)
Calcium: 9.4 mg/dL (ref 8.4–10.5)
Creatinine, Ser: 1 mg/dL (ref 0.4–1.2)
GFR: 61.42 mL/min (ref >60.00)
Glucose, Bld: 84 mg/dL (ref 70–99)
Sodium: 139 mEq/L (ref 135–145)

## 2012-08-23 LAB — LIPID PANEL
Cholesterol: 245 mg/dL — ABNORMAL HIGH (ref 0–200)
HDL: 35.9 mg/dL — ABNORMAL LOW (ref >39.00)
Triglycerides: 729 mg/dL — ABNORMAL HIGH (ref 0.0–149.0)
VLDL: 145.8 mg/dL — ABNORMAL HIGH (ref 0.0–40.0)

## 2012-08-23 LAB — HEPATIC FUNCTION PANEL
Albumin: 3.4 g/dL — ABNORMAL LOW (ref 3.5–5.2)
Alkaline Phosphatase: 59 U/L (ref 39–117)
Total Protein: 7.4 g/dL (ref 6.0–8.3)

## 2012-09-06 NOTE — Assessment & Plan Note (Signed)
Deteriorated.  Pt's BP is very high today and no where near goal.  Asymptomatic.  Add Bystolic as pt has chronic renal disease.  Will follow closely.

## 2012-09-06 NOTE — Assessment & Plan Note (Signed)
New.  No evidence of infection.  Offered to drain area in sterile environment, pt declined.  Reviewed supportive care and red flags that should prompt return.  Pt expressed understanding and is in agreement w/ plan.

## 2012-09-09 ENCOUNTER — Other Ambulatory Visit: Payer: Self-pay | Admitting: *Deleted

## 2012-09-09 DIAGNOSIS — E781 Pure hyperglyceridemia: Secondary | ICD-10-CM

## 2012-09-09 MED ORDER — FENOFIBRATE 160 MG PO TABS: 160.0000 mg | ORAL_TABLET | Freq: Every day | ORAL | Status: DC

## 2012-09-09 NOTE — Telephone Encounter (Signed)
Rx for fenofibrate sent to [facility], pt notified of this via voice mail. Pt also notified of very high trigs and to eat healthy diet along with regular exercise.

## 2012-09-22 ENCOUNTER — Other Ambulatory Visit: Payer: Self-pay

## 2012-09-22 ENCOUNTER — Encounter: Payer: Self-pay | Admitting: Family Medicine

## 2012-09-22 ENCOUNTER — Telehealth: Payer: Self-pay | Admitting: Family Medicine

## 2012-09-22 ENCOUNTER — Ambulatory Visit (INDEPENDENT_AMBULATORY_CARE_PROVIDER_SITE_OTHER): Payer: Medicare Other | Admitting: Family Medicine

## 2012-09-22 VITALS — BP 146/84 | HR 74 | Temp 99.1°F | Ht 60.0 in | Wt 174.8 lb

## 2012-09-22 DIAGNOSIS — I10 Essential (primary) hypertension: Secondary | ICD-10-CM

## 2012-09-22 DIAGNOSIS — E669 Obesity, unspecified: Secondary | ICD-10-CM | POA: Insufficient documentation

## 2012-09-22 MED ORDER — LORCASERIN HCL 10 MG PO TABS: 1.0000 | ORAL_TABLET | Freq: Two times a day (BID) | ORAL | Status: DC

## 2012-09-22 MED ORDER — ENALAPRIL MALEATE 10 MG PO TABS: 5.0000 mg | ORAL_TABLET | Freq: Two times a day (BID) | ORAL | Status: DC

## 2012-09-22 MED ORDER — NEBIVOLOL HCL 20 MG PO TABS: 1.0000 | ORAL_TABLET | Freq: Every day | ORAL | Status: DC

## 2012-09-22 NOTE — Assessment & Plan Note (Signed)
Improved since last visit w/ addition of Bystolic but still not at goal.  Titrate to 20mg  daily.  Continue ACE.  Will follow closely.

## 2012-09-22 NOTE — Progress Notes (Signed)
  Subjective:    Patient ID: Marissa Scott, female    DOB: March 30, 1965, 47 y.o.   MRN: [id number]  HPI HTN- pt was started on Bystolic at last visit after BP was 180/98.  BP has improved today but is not at goal given pt's hx of kidney disease.  Pt reports no side effects w/ medicine.  No CP, SOB, HAs, visual changes, edema.  Obesity- pt has done her own research and would like to start Belviq to assist w/ weight loss.  Pt feels this would help her BP, diabetes, cholesterol.   Review of Systems For ROS see HPI     Objective:   Physical Exam  Vitals reviewed. Constitutional: She is oriented to person, place, and time. She appears well-developed and well-nourished. No distress.  HENT:  Head: Normocephalic and atraumatic.  Eyes: Conjunctivae and EOM are normal. Pupils are equal, round, and reactive to light.  Neck: Normal range of motion. Neck supple. No thyromegaly present.  Cardiovascular: Normal rate, regular rhythm, normal heart sounds and intact distal pulses.   No murmur heard. Pulmonary/Chest: Effort normal and breath sounds normal. No respiratory distress.  Abdominal: Soft. She exhibits no distension. There is no tenderness.  Musculoskeletal: She exhibits no edema.  Lymphadenopathy:    She has no cervical adenopathy.  Neurological: She is alert and oriented to person, place, and time.  Skin: Skin is warm and dry.  Psychiatric: She has a normal mood and affect. Her behavior is normal.          Assessment & Plan:

## 2012-09-22 NOTE — Telephone Encounter (Signed)
Rx sent to pharmacy. Patient notified. 

## 2012-09-22 NOTE — Patient Instructions (Addendum)
Follow up in 1 month to recheck BP Increase the Bystolic to 20mg  daily (2 of what you have and 1 of the new sample) Start the Belviq twice daily Continue healthy food choices and regular exercse Call with any questions or concerns Happy Labor Day!!!

## 2012-09-22 NOTE — Telephone Encounter (Signed)
Patient came to check-out after her office visit and states that she forgot to ask Dr. Birdie Riddle to refill a medication: enalapril (VASOTEC) 10 MG tablet. Says that this was originally prescribed by her Nephrologist and the rx is expired at the pharmacy. She does not want to have to make an appointment with her nephrologist to refill this. Please advise.

## 2012-09-22 NOTE — Assessment & Plan Note (Signed)
New.  Start Belviq at pt's request.  Stressed importance of ongoing healthy food choices and regular exercise in addition to medication.  Will follow closely.

## 2012-09-30 ENCOUNTER — Other Ambulatory Visit: Payer: Self-pay | Admitting: Neurosurgery

## 2012-09-30 DIAGNOSIS — M47817 Spondylosis without myelopathy or radiculopathy, lumbosacral region: Secondary | ICD-10-CM

## 2012-10-04 ENCOUNTER — Ambulatory Visit
Admission: RE | Admit: 2012-10-04 | Discharge: 2012-10-04 | Disposition: A | Discharge status: Home / Self Care | Payer: Medicare Other | Source: Ambulatory Visit | Attending: Neurosurgery | Admitting: Neurosurgery

## 2012-10-04 DIAGNOSIS — M47817 Spondylosis without myelopathy or radiculopathy, lumbosacral region: Secondary | ICD-10-CM

## 2012-10-18 ENCOUNTER — Encounter: Payer: Self-pay | Admitting: Endocrinology

## 2012-10-18 ENCOUNTER — Ambulatory Visit (INDEPENDENT_AMBULATORY_CARE_PROVIDER_SITE_OTHER): Payer: Medicare Other | Admitting: Endocrinology

## 2012-10-18 VITALS — BP 134/80 | HR 72 | Ht 60.0 in | Wt 170.0 lb

## 2012-10-18 DIAGNOSIS — E1129 Type 2 diabetes mellitus with other diabetic kidney complication: Secondary | ICD-10-CM

## 2012-10-18 MED ORDER — BENZONATATE 200 MG PO CAPS: 200.0000 mg | ORAL_CAPSULE | Freq: Two times a day (BID) | ORAL | Status: DC | PRN

## 2012-10-18 MED ORDER — GLUCOSE BLOOD VI STRP: 1.0000 | ORAL_STRIP | Freq: Every day | Status: DC

## 2012-10-18 NOTE — Patient Instructions (Addendum)
A diabetes blood test is being requested for you today.  We'll contact you with results. Based on the results, we may need to increase the onglyza.    Please come back for a follow-up appointment in 3 months.   check your blood sugar once a day.  vary the time of day when you check, between before the 3 meals, and at bedtime.  also check if you have symptoms of your blood sugar being too high or too low.  please keep a record of the readings and bring it to your next appointment here.  please call us sooner if your blood sugar goes below 70, or if you have a lot of readings over 200.   i have sent a prescription to your pharmacy, for the cough.   Please see dr Birdie Riddle if this does not help.

## 2012-10-18 NOTE — Progress Notes (Signed)
Subjective:    Patient ID: Marissa Scott, female    DOB: 1965/05/29, 47 y.o.   MRN: [id number]  HPI pt returns for f/u of type 2 DM (dx'ed 2000; she has mild if any neuropathy of the lower extremities; she has associated renal insufficiency).  She says she feels well in general, except for ongoing low-back pain.  She does not check cbg's.  She stopped the onglyza. Pt states 2 week of dry-quality cough in the chest, but no assoc sob.  Past Medical History  Diagnosis Date  . RENAL DISEASE, CHRONIC, STAGE III 01/30/2008    Follows w/ Renal  . Polycystic ovaries 01/04/2009  . HYPERURICEMIA 10/01/2008  . HYPERTENSION 01/30/2008  . HYPERLIPIDEMIA 01/30/2008  . DIABETES MELLITUS, TYPE II 01/30/2008    Past Surgical History  Procedure Laterality Date  . Lumbar laminectomy      L4-L5  . Nephrectomy      left removed, partial right-Ottelin  . Appendectomy    . Tubal ligation      GYN Mezer  . Right hand cyst    . Neural stimulator      History   Social History  . Marital Status: Married    Spouse Name: N/A    Number of Children: N/A  . Years of Education: N/A   Occupational History  . Disabled    Social History Main Topics  . Smoking status: Never Smoker   . Smokeless tobacco: Not on file  . Alcohol Use: No  . Drug Use: Not on file  . Sexual Activity: Not on file   Other Topics Concern  . Not on file   Social History Narrative   Disabled for back pain    Current Outpatient Prescriptions on File Prior to Visit  Medication Sig Dispense Refill  . enalapril (VASOTEC) 10 MG tablet Take 0.5 tablets (5 mg total) by mouth 2 (two) times daily.  30 tablet  3  . fenofibrate 160 MG tablet Take 1 tablet (160 mg total) by mouth daily.  30 tablet  5  . furosemide (LASIX) 20 MG tablet Take 20 mg by mouth as needed.        Marland Kitchen glimepiride (AMARYL) 4 MG tablet Take 1 tablet (4 mg total) by mouth daily before breakfast.  30 tablet  3  . HYDROmorphone (DILAUDID) 2 MG tablet Take 1 tablet  by mouth every 6 (six) hours.      . Lorcaserin HCl (BELVIQ) 10 MG TABS Take 1 tablet by mouth 2 (two) times daily.  60 tablet  3  . Nebivolol HCl 20 MG TABS Take 1 tablet (20 mg total) by mouth daily.  30 tablet  3   No current facility-administered medications on file prior to visit.   Allergies  Allergen Reactions  . Pioglitazone     arthralgias  . Penicillins Rash   Family History  Problem Relation Age of Onset  . Hypertension Father   . Heart disease Father     CAD  . Diabetes Father   . Stroke Father   . Cancer Father     Colon Cancer    BP 134/80  Pulse 72  Ht 5' (1.524 m)  Wt 170 lb (77.111 kg)  BMI 33.2 kg/m2  SpO2 96%  LMP 09/21/2012  Review of Systems Denies fever.  She has lost weight.      Objective:   Physical Exam VITAL SIGNS:  See vs page.   GENERAL: no distress.   LUNGS:  Clear to  auscultation Lab Results  Component Value Date   HGBA1C 7.3* 10/18/2012      Assessment & Plan:  DM: this is the best control this pt should aim for, given this sulfonylurea-containing regimen, which does match insulin to her changing needs throughout the day.  therapy limited by pt's request for least expensive meds. Cough, persistent, uncertain etiology.

## 2012-10-19 ENCOUNTER — Telehealth: Payer: Self-pay | Admitting: Family Medicine

## 2012-10-19 NOTE — Telephone Encounter (Signed)
Spoke with pharmacist and Rx was not received. Called in Rx. Pt informed. SW, CMA

## 2012-10-19 NOTE — Telephone Encounter (Signed)
Patient was told to increase her Bystolic to 20mg  daily at her appointment on 09/22/12. Patient states that her rx was never sent to [facility] on [address]. Please advise.

## 2012-10-24 ENCOUNTER — Encounter: Payer: Self-pay | Admitting: Lab

## 2012-10-25 ENCOUNTER — Encounter: Payer: Self-pay | Admitting: Family Medicine

## 2012-10-25 ENCOUNTER — Ambulatory Visit (INDEPENDENT_AMBULATORY_CARE_PROVIDER_SITE_OTHER): Payer: Medicare Other | Admitting: Family Medicine

## 2012-10-25 VITALS — BP 140/86 | HR 67 | Temp 98.5°F | Wt 171.0 lb

## 2012-10-25 DIAGNOSIS — I10 Essential (primary) hypertension: Secondary | ICD-10-CM

## 2012-10-25 NOTE — Progress Notes (Signed)
  Subjective:    Patient ID: Marissa Scott, female    DOB: [birth year], 47 y.o.   MRN: [id number]  HPI HTN- Bystolic was increased at last visit to 20mg .  Continues on Enalapril 10mg  and Lasix 20mg .  Pt is pain today (knee pain).  BP was 111/68 at Dr Roy's office.  No CP, SOB, HAs, visual changes, edema.   Review of Systems For ROS see HPI     Objective:   Physical Exam  Constitutional: She is oriented to person, place, and time. She appears well-developed and well-nourished. No distress.  HENT:  Head: Normocephalic and atraumatic.  Mouth/Throat: Uvula is midline and mucous membranes are normal.  Eyes: Conjunctivae and EOM are normal. Pupils are equal, round, and reactive to light.  Neck: Normal range of motion. Neck supple.  Cardiovascular: Normal rate, regular rhythm and normal heart sounds.   Pulmonary/Chest: Effort normal and breath sounds normal. No respiratory distress. She has no wheezes.  Abdominal: Soft. Bowel sounds are normal. She exhibits no distension. There is no tenderness. There is no rebound and no guarding.  Musculoskeletal: She exhibits no edema.  Lymphadenopathy:    She has no cervical adenopathy.  Neurological: She is alert and oriented to person, place, and time.  Skin: Skin is warm and dry.  Psychiatric: She has a normal mood and affect. Her behavior is normal.          Assessment & Plan:

## 2012-10-25 NOTE — Patient Instructions (Addendum)
Schedule a lab visit for next month to recheck lipids Your BP looks good today- no med changes Call with any questions or concerns Hang in there!!!

## 2012-10-25 NOTE — Assessment & Plan Note (Signed)
Pt's BP remains elevated today but pt in a lot of pain (back and knee- following w/ ortho and neurosurg).  BP control was excellent at Neurosurg office last week.  Asymptomatic.  No changes.  Will follow.

## 2012-11-24 ENCOUNTER — Other Ambulatory Visit (INDEPENDENT_AMBULATORY_CARE_PROVIDER_SITE_OTHER): Payer: Medicare Other

## 2012-11-24 DIAGNOSIS — E785 Hyperlipidemia, unspecified: Secondary | ICD-10-CM

## 2012-11-24 LAB — LIPID PANEL
HDL: 23.3 mg/dL — ABNORMAL LOW (ref >39.00)
Total CHOL/HDL Ratio: 9
VLDL: 133.4 mg/dL — ABNORMAL HIGH (ref 0.0–40.0)

## 2012-11-24 LAB — LDL CHOLESTEROL, DIRECT: Direct LDL: 96.5 mg/dL

## 2012-11-29 ENCOUNTER — Other Ambulatory Visit: Payer: Self-pay | Admitting: General Practice

## 2012-11-29 ENCOUNTER — Encounter: Payer: Self-pay | Admitting: General Practice

## 2012-11-29 MED ORDER — NIACIN ER (ANTIHYPERLIPIDEMIC) 500 MG PO TBCR: 500.0000 mg | EXTENDED_RELEASE_TABLET | Freq: Every day | ORAL | Status: DC

## 2012-11-29 MED ORDER — NIACIN ER (ANTIHYPERLIPIDEMIC) 1000 MG PO TBCR: 1000.0000 mg | EXTENDED_RELEASE_TABLET | Freq: Every day | ORAL | Status: DC

## 2012-12-01 ENCOUNTER — Other Ambulatory Visit: Payer: Self-pay | Admitting: *Deleted

## 2012-12-01 MED ORDER — GLIMEPIRIDE 4 MG PO TABS: 4.0000 mg | ORAL_TABLET | Freq: Every day | ORAL | Status: DC

## 2012-12-01 NOTE — Telephone Encounter (Signed)
rx refill

## 2012-12-14 ENCOUNTER — Telehealth: Payer: Self-pay | Admitting: *Deleted

## 2012-12-14 NOTE — Telephone Encounter (Signed)
Left message on answering machine. 

## 2012-12-14 NOTE — Telephone Encounter (Signed)
Patient called and states that the bystolic is on backorder at the pharmacy. Would like to know what else she can take instead? We do not have samples of the 20mg  that we can offer her. Please advise. SW

## 2012-12-14 NOTE — Telephone Encounter (Signed)
Could she try another pharmacy?

## 2012-12-16 MED ORDER — CARVEDILOL 12.5 MG PO TABS: 12.5000 mg | ORAL_TABLET | Freq: Two times a day (BID) | ORAL | Status: DC

## 2012-12-16 NOTE — Telephone Encounter (Signed)
Spoke with pt to see if she checked with other pharmacy's to see if they have it, and she stated that it's on backorder and no pharmacy can get it.  Informed her that Dr. Birdie Riddle would like to change her BP med to Carvedilol 12.5mg  bid until the pharmacy can get in the Bystolic   Pt agreed.  Informed the pt that we will need to recheck her BP 2 weeks after she starts the new med Carvedilol.  Pt agreed.  Informed the pt to call and schedule an nurse visit.  New med sent to the pharmacy by e-script.//AB/CMA

## 2013-01-16 ENCOUNTER — Other Ambulatory Visit: Payer: Self-pay

## 2013-01-16 DIAGNOSIS — Z1231 Encounter for screening mammogram for malignant neoplasm of breast: Secondary | ICD-10-CM

## 2013-01-17 ENCOUNTER — Telehealth: Payer: Self-pay | Admitting: Endocrinology

## 2013-01-17 ENCOUNTER — Ambulatory Visit (INDEPENDENT_AMBULATORY_CARE_PROVIDER_SITE_OTHER): Payer: Medicare Other | Admitting: Endocrinology

## 2013-01-17 ENCOUNTER — Encounter: Payer: Self-pay | Admitting: Endocrinology

## 2013-01-17 VITALS — BP 124/78 | HR 63 | Temp 97.3°F | Ht 60.0 in | Wt 171.0 lb

## 2013-01-17 DIAGNOSIS — E1129 Type 2 diabetes mellitus with other diabetic kidney complication: Secondary | ICD-10-CM

## 2013-01-17 LAB — BASIC METABOLIC PANEL
BUN: 28 mg/dL — ABNORMAL HIGH (ref 6–23)
CO2: 24 mEq/L (ref 19–32)
Calcium: 9.5 mg/dL (ref 8.4–10.5)
GFR: 48.94 mL/min — ABNORMAL LOW (ref >60.00)
Glucose, Bld: 85 mg/dL (ref 70–99)
Sodium: 138 mEq/L (ref 135–145)

## 2013-01-17 LAB — HEMOGLOBIN A1C: Hgb A1c MFr Bld: 7.9 % — ABNORMAL HIGH (ref 4.6–6.5)

## 2013-01-17 LAB — MICROALBUMIN / CREATININE URINE RATIO: Microalb, Ur: 121.1 mg/dL — ABNORMAL HIGH (ref 0.0–1.9)

## 2013-01-17 MED ORDER — SAXAGLIPTIN HCL 2.5 MG PO TABS: 2.5000 mg | ORAL_TABLET | Freq: Every day | ORAL | Status: DC

## 2013-01-17 NOTE — Patient Instructions (Addendum)
A diabetes blood test is being requested for you today.  We'll contact you with results. Based on the results, we may need to resume the onglyza.    Please come back for a follow-up appointment in 3 months.   check your blood sugar once a day.  vary the time of day when you check, between before the 3 meals, and at bedtime.  also check if you have symptoms of your blood sugar being too high or too low.  please keep a record of the readings and bring it to your next appointment here.  please call us sooner if your blood sugar goes below 70, or if you have a lot of readings over 200.

## 2013-01-17 NOTE — Progress Notes (Signed)
Subjective:    Patient ID: Marissa Scott, female    DOB: 10-24-1965, 47 y.o.   MRN: [id number]  HPI pt returns for f/u of type 2 DM (dx'ed 2000, on a routine blood test; she has mild if any neuropathy of the lower extremities; she has associated renal insufficiency; she did not tolerate actos due to arthralgias; she cannot take metformin due to renal insufficiency; she says she can afford name-brand meds only on a limited basis).  She says she feels well in general, except for ongoing arthralgias.  She does not check cbg's.   Past Medical History  Diagnosis Date  . RENAL DISEASE, CHRONIC, STAGE III 01/30/2008    Follows w/ Renal  . Polycystic ovaries 01/04/2009  . HYPERURICEMIA 10/01/2008  . HYPERTENSION 01/30/2008  . HYPERLIPIDEMIA 01/30/2008  . DIABETES MELLITUS, TYPE II 01/30/2008    Past Surgical History  Procedure Laterality Date  . Lumbar laminectomy      L4-L5  . Nephrectomy      left removed, partial right-Ottelin  . Appendectomy    . Tubal ligation      [facility]N [facility]  . Right hand cyst    . Neural stimulator      History   Social History  . Marital Status: Married    Spouse Name: N/A    Number of Children: N/A  . Years of Education: N/A   Occupational History  . Disabled    Social History Main Topics  . Smoking status: Never Smoker   . Smokeless tobacco: Not on file  . Alcohol Use: No  . Drug Use: Not on file  . Sexual Activity: Not on file   Other Topics Concern  . Not on file   Social History Narrative   Disabled for back pain    Current Outpatient Prescriptions on File Prior to Visit  Medication Sig Dispense Refill  . carvedilol (COREG) 12.5 MG tablet Take 1 tablet (12.5 mg total) by mouth 2 (two) times daily with a meal.  60 tablet  1  . enalapril (VASOTEC) 10 MG tablet Take 0.5 tablets (5 mg total) by mouth 2 (two) times daily.  30 tablet  3  . fenofibrate 160 MG tablet Take 1 tablet (160 mg total) by mouth daily.  30 tablet  5  . furosemide  (LASIX) 20 MG tablet Take 20 mg by mouth as needed.        Marland Kitchen glimepiride (AMARYL) 4 MG tablet Take 1 tablet (4 mg total) by mouth daily before breakfast.  30 tablet  3  . glucose blood (RELION GLUCOSE TEST STRIPS) test strip 1 each by Other route daily. And lancets 1/day 250.00  100 each  12  . HYDROmorphone (DILAUDID) 2 MG tablet Take 1 tablet by mouth every 6 (six) hours.      . Lorcaserin HCl (BELVIQ) 10 MG TABS Take 1 tablet by mouth 2 (two) times daily.  60 tablet  3  . niacin (NIASPAN) 1000 MG CR tablet Take 1 tablet (1,000 mg total) by mouth at bedtime. Start this medication after 1 month of niaspan 500mg .  30 tablet  3   No current facility-administered medications on file prior to visit.   Allergies  Allergen Reactions  . Pioglitazone     arthralgias  . Penicillins Rash   Family History  Problem Relation Age of Onset  . Hypertension Father   . Heart disease Father     CAD  . Diabetes Father   . Stroke Father   .  Cancer Father     Colon Cancer   BP 124/78  Pulse 63  Temp(Src) 97.3 F (36.3 C) (Oral)  Ht 5' (1.524 m)  Wt 171 lb (77.565 kg)  BMI 33.40 kg/m2  SpO2 99%  Review of Systems denies hypoglycemia and weight change.      Objective:   Physical Exam VITAL SIGNS:  See vs page GENERAL: no distress  Lab Results  Component Value Date   HGBA1C 7.9* 01/17/2013      Assessment & Plan:  Type 2 DM: she needs increased rx Economic circumstances: these limits the rx of DM Arthralgias: these limit exercise rx of DM

## 2013-01-17 NOTE — Telephone Encounter (Signed)
Correction: continue glimepiride 4 mg, qam Add onglyza, 2.5 mg qam. Please come back for a follow-up appointment in 3 months

## 2013-01-18 NOTE — Telephone Encounter (Signed)
Patient informed. 

## 2013-02-06 ENCOUNTER — Ambulatory Visit
Admission: RE | Admit: 2013-02-06 | Discharge: 2013-02-06 | Disposition: A | Discharge status: Home / Self Care | Payer: Medicare Other | Source: Ambulatory Visit

## 2013-02-06 DIAGNOSIS — Z1231 Encounter for screening mammogram for malignant neoplasm of breast: Secondary | ICD-10-CM

## 2013-03-08 ENCOUNTER — Other Ambulatory Visit: Payer: Self-pay | Admitting: Family Medicine

## 2013-03-08 NOTE — Telephone Encounter (Signed)
Med filled.  

## 2013-04-10 ENCOUNTER — Other Ambulatory Visit: Payer: Self-pay | Admitting: Endocrinology

## 2013-04-18 ENCOUNTER — Ambulatory Visit: Payer: Medicare Other | Admitting: Endocrinology

## 2013-05-04 ENCOUNTER — Other Ambulatory Visit: Payer: Self-pay | Admitting: Family Medicine

## 2013-05-04 ENCOUNTER — Other Ambulatory Visit: Payer: Self-pay

## 2013-05-04 MED ORDER — GLIMEPIRIDE 4 MG PO TABS: ORAL_TABLET | ORAL | Status: DC

## 2013-05-04 NOTE — Telephone Encounter (Signed)
Med filled for 30 days and letter mailed.

## 2013-05-06 LAB — HM DIABETES EYE EXAM

## 2013-05-16 ENCOUNTER — Encounter: Payer: Self-pay | Admitting: Endocrinology

## 2013-05-16 ENCOUNTER — Ambulatory Visit (INDEPENDENT_AMBULATORY_CARE_PROVIDER_SITE_OTHER): Payer: Medicare Other | Admitting: Endocrinology

## 2013-05-16 VITALS — BP 112/74 | HR 76 | Temp 97.7°F | Ht 60.0 in | Wt 170.0 lb

## 2013-05-16 DIAGNOSIS — E1129 Type 2 diabetes mellitus with other diabetic kidney complication: Secondary | ICD-10-CM

## 2013-05-16 LAB — HEMOGLOBIN A1C: HEMOGLOBIN A1C: 9 % — AB (ref 4.6–6.5)

## 2013-05-16 MED ORDER — GLIMEPIRIDE 2 MG PO TABS: 2.0000 mg | ORAL_TABLET | Freq: Every day | ORAL | Status: DC

## 2013-05-16 NOTE — Patient Instructions (Addendum)
A diabetes blood test is being requested for you today.  We'll contact you with results. Please reduce the glimepiride to 2 mg daily. Please start taking "bromocriptine," to help your blood sugar. It has possible side effects of nausea and dizziness.  These go away with time.  You can avoid these by taking it at bedtime.     Please come back for a follow-up appointment in 3 months.   check your blood sugar once a day.  vary the time of day when you check, between before the 3 meals, and at bedtime.  also check if you have symptoms of your blood sugar being too high or too low.  please keep a record of the readings and bring it to your next appointment here.  please call us sooner if your blood sugar goes below 70, or if you have a lot of readings over 200.

## 2013-05-16 NOTE — Progress Notes (Signed)
Subjective:    Patient ID: Marissa Scott, female    DOB: 1965-09-24, 48 y.o.   MRN: [id number]  HPI pt returns for f/u of type 2 DM (dx'ed 2000, on a routine blood test; she has mild if any neuropathy of the lower extremities; she has associated renal insufficiency; she did not tolerate actos due to arthralgias; she cannot take metformin due to renal insufficiency; she says she can afford name-brand meds only on a limited basis).  She says she feels well in general, except for ongoing arthralgias.  no cbg record, but states cbg's vary from 70-300.  She has been on steroids several times.   Past Medical History  Diagnosis Date  . RENAL DISEASE, CHRONIC, STAGE III 01/30/2008    Follows w/ Renal  . Polycystic ovaries 01/04/2009  . HYPERURICEMIA 10/01/2008  . HYPERTENSION 01/30/2008  . HYPERLIPIDEMIA 01/30/2008  . DIABETES MELLITUS, TYPE II 01/30/2008    Past Surgical History  Procedure Laterality Date  . Lumbar laminectomy      L4-L5  . Nephrectomy      left removed, partial right-Ottelin  . Appendectomy    . Tubal ligation      GYN [facility]  . Right hand cyst    . Neural stimulator      History   Social History  . Marital Status: Married    Spouse Name: N/A    Number of Children: N/A  . Years of Education: N/A   Occupational History  . Disabled    Social History Main Topics  . Smoking status: Never Smoker   . Smokeless tobacco: Not on file  . Alcohol Use: No  . Drug Use: Not on file  . Sexual Activity: Not on file   Other Topics Concern  . Not on file   Social History Narrative   Disabled for back pain    Current Outpatient Prescriptions on File Prior to Visit  Medication Sig Dispense Refill  . carvedilol (COREG) 12.5 MG tablet TAKE ONE TABLET BY MOUTH TWICE DAILY WITH MEALS  60 tablet  0  . enalapril (VASOTEC) 10 MG tablet Take 0.5 tablets (5 mg total) by mouth 2 (two) times daily.  30 tablet  3  . fenofibrate 160 MG tablet Take 1 tablet (160 mg total) by mouth  daily.  30 tablet  5  . furosemide (LASIX) 20 MG tablet Take 20 mg by mouth as needed.        Marland Kitchen glucose blood (RELION GLUCOSE TEST STRIPS) test strip 1 each by Other route daily. And lancets 1/day 250.00  100 each  12  . HYDROmorphone (DILAUDID) 2 MG tablet Take 1 tablet by mouth every 6 (six) hours.      . Lorcaserin HCl (BELVIQ) 10 MG TABS Take 1 tablet by mouth 2 (two) times daily.  60 tablet  3  . niacin (NIASPAN) 1000 MG CR tablet Take 1 tablet (1,000 mg total) by mouth at bedtime. Start this medication after 1 month of niaspan 500mg .  30 tablet  3  . saxagliptin HCl (ONGLYZA) 2.5 MG TABS tablet Take 1 tablet (2.5 mg total) by mouth daily.  30 tablet  11   No current facility-administered medications on file prior to visit.    Allergies  Allergen Reactions  . Pioglitazone     arthralgias  . Penicillins Rash    Family History  Problem Relation Age of Onset  . Hypertension Father   . Heart disease Father     CAD  .  Diabetes Father   . Stroke Father   . Cancer Father     Colon Cancer   BP 112/74  Pulse 76  Temp(Src) 97.7 F (36.5 C) (Oral)  Ht 5' (1.524 m)  Wt 170 lb (77.111 kg)  BMI 33.20 kg/m2  SpO2 97%  Review of Systems Denies weight change and n/v.    Objective:   Physical Exam VITAL SIGNS:  See vs page GENERAL: no distress  Lab Results  Component Value Date   HGBA1C 9.0* 05/16/2013      Assessment & Plan:  Type 2 DM: she needs increased rx Economic circumstances: these limit the rx of DM Arthralgias: steroids complicate the rx of DM Renal insufficiency: this limits oral DM options.

## 2013-06-08 ENCOUNTER — Other Ambulatory Visit: Payer: Self-pay | Admitting: Family Medicine

## 2013-06-08 NOTE — Telephone Encounter (Signed)
2nd letter sent to pt.

## 2013-08-16 ENCOUNTER — Ambulatory Visit: Payer: Medicare Other | Admitting: Endocrinology

## 2013-09-12 ENCOUNTER — Ambulatory Visit (INDEPENDENT_AMBULATORY_CARE_PROVIDER_SITE_OTHER): Payer: Medicare Other | Admitting: Endocrinology

## 2013-09-12 ENCOUNTER — Encounter: Payer: Self-pay | Admitting: Endocrinology

## 2013-09-12 VITALS — BP 138/88 | HR 68 | Temp 98.4°F | Ht 60.0 in | Wt 167.0 lb

## 2013-09-12 DIAGNOSIS — E1129 Type 2 diabetes mellitus with other diabetic kidney complication: Secondary | ICD-10-CM

## 2013-09-12 LAB — HEMOGLOBIN A1C: HEMOGLOBIN A1C: 8.3 % — AB (ref 4.6–6.5)

## 2013-09-12 MED ORDER — SAXAGLIPTIN HCL 2.5 MG PO TABS: 2.5000 mg | ORAL_TABLET | Freq: Every day | ORAL | Status: DC

## 2013-09-12 NOTE — Progress Notes (Signed)
Subjective:    Patient ID: Marissa Scott, female    DOB: 07/08/1965, 48 y.o.   MRN: [id number]  HPI pt returns for f/u of type 2 DM (dx'ed 2000, on a routine blood test; she has mild if any neuropathy of the lower extremities; she has associated renal insufficiency; she takes 3 oral DM meds; she did not tolerate actos due to arthralgias; she cannot take metformin due to renal insufficiency; she says she can afford name-brand meds only on a limited basis; she refuses insulin).  She says she feels well in general, except for ongoing arthralgias.  She is back on steroids again.  no cbg record, but states cbg's are in the mid-100's.  She does not take the onglyza.   Past Medical History  Diagnosis Date  . RENAL DISEASE, CHRONIC, STAGE III 01/30/2008    Follows w/ Renal  . Polycystic ovaries 01/04/2009  . HYPERURICEMIA 10/01/2008  . HYPERTENSION 01/30/2008  . HYPERLIPIDEMIA 01/30/2008  . DIABETES MELLITUS, TYPE II 01/30/2008    Past Surgical History  Procedure Laterality Date  . Lumbar laminectomy      L4-L5  . Nephrectomy      left removed, partial right-Ottelin  . Appendectomy    . Tubal ligation      GYN [facility]  . Right hand cyst    . Neural stimulator      History   Social History  . Marital Status: Married    Spouse Name: N/A    Number of Children: N/A  . Years of Education: N/A   Occupational History  . Disabled    Social History Main Topics  . Smoking status: Never Smoker   . Smokeless tobacco: Not on file  . Alcohol Use: No  . Drug Use: Not on file  . Sexual Activity: Not on file   Other Topics Concern  . Not on file   Social History Narrative   Disabled for back pain    Current Outpatient Prescriptions on File Prior to Visit  Medication Sig Dispense Refill  . Bromocriptine Mesylate (CYCLOSET) 0.8 MG TABS Take 1 tablet by mouth at bedtime.      . carvedilol (COREG) 12.5 MG tablet TAKE ONE TABLET BY MOUTH TWICE DAILY WITH MEALS  60 tablet  0  . enalapril  (VASOTEC) 10 MG tablet Take 0.5 tablets (5 mg total) by mouth 2 (two) times daily.  30 tablet  3  . fenofibrate 160 MG tablet Take 1 tablet (160 mg total) by mouth daily.  30 tablet  5  . furosemide (LASIX) 20 MG tablet Take 20 mg by mouth as needed.        Marland Kitchen glimepiride (AMARYL) 2 MG tablet Take 1 tablet (2 mg total) by mouth daily before breakfast.  30 tablet  3  . glucose blood (RELION GLUCOSE TEST STRIPS) test strip 1 each by Other route daily. And lancets 1/day 250.00  100 each  12  . HYDROmorphone (DILAUDID) 2 MG tablet Take 1 tablet by mouth every 6 (six) hours.      . Lorcaserin HCl (BELVIQ) 10 MG TABS Take 1 tablet by mouth 2 (two) times daily.  60 tablet  3  . niacin (NIASPAN) 1000 MG CR tablet Take 1 tablet (1,000 mg total) by mouth at bedtime. Start this medication after 1 month of niaspan 500mg .  30 tablet  3   No current facility-administered medications on file prior to visit.    Allergies  Allergen Reactions  . Pioglitazone  arthralgias  . Penicillins Rash    Family History  Problem Relation Age of Onset  . Hypertension Father   . Heart disease Father     CAD  . Diabetes Father   . Stroke Father   . Cancer Father     Colon Cancer    BP 138/88  Pulse 68  Temp(Src) 98.4 F (36.9 C) (Oral)  Ht 5' (1.524 m)  Wt 167 lb (75.751 kg)  BMI 32.62 kg/m2  SpO2 99%     Review of Systems She denies hypoglycemia and weight change.    Objective:   Physical Exam VITAL SIGNS:  See vs page GENERAL: no distress Pulses: dorsalis pedis intact bilat.   Feet: no deformity. normal color and temp.  no edema Skin:  no ulcer on the feet.   Neuro: sensation is intact to touch on the feet   Lab Results  Component Value Date   HGBA1C 8.3* 09/12/2013      Assessment & Plan:  Noncompliance with cbg recording and meds: I'll work around this as best I can.  Pt is advised to take meds as rx'ed.   DM: moderate exacerbation.  i advised insulin--pt declines.  She is advised  of risks.   Arthralgias: these complicate the exercise rx of DM, and steroids used to rx worsen glycemic control.     Patient is advised the following: Patient Instructions  A diabetes blood test is being requested for you today.  We'll contact you with results. If it is high, we can add "acarbose."   Please resume the onglyza.  i have sent a prescription to your pharmacy.      Please come back for a follow-up appointment in 3 months.   check your blood sugar once a day.  vary the time of day when you check, between before the 3 meals, and at bedtime.  also check if you have symptoms of your blood sugar being too high or too low.  please keep a record of the readings and bring it to your next appointment here.  please call us sooner if your blood sugar goes below 70, or if you have a lot of readings over 200.     Please resume the onglyza. i have sent a prescription to your pharmacy.

## 2013-09-12 NOTE — Patient Instructions (Addendum)
A diabetes blood test is being requested for you today.  We'll contact you with results. If it is high, we can add "acarbose."   Please resume the onglyza.  i have sent a prescription to your pharmacy.      Please come back for a follow-up appointment in 3 months.   check your blood sugar once a day.  vary the time of day when you check, between before the 3 meals, and at bedtime.  also check if you have symptoms of your blood sugar being too high or too low.  please keep a record of the readings and bring it to your next appointment here.  please call us sooner if your blood sugar goes below 70, or if you have a lot of readings over 200.

## 2013-10-26 ENCOUNTER — Encounter: Payer: Self-pay | Admitting: General Practice

## 2013-10-26 ENCOUNTER — Other Ambulatory Visit: Payer: Self-pay | Admitting: General Practice

## 2013-10-26 MED ORDER — CARVEDILOL 12.5 MG PO TABS: ORAL_TABLET | ORAL | Status: DC

## 2013-11-30 ENCOUNTER — Ambulatory Visit (INDEPENDENT_AMBULATORY_CARE_PROVIDER_SITE_OTHER): Payer: Medicare Other | Admitting: Family Medicine

## 2013-11-30 ENCOUNTER — Encounter: Payer: Self-pay | Admitting: Family Medicine

## 2013-11-30 VITALS — BP 136/82 | HR 100 | Temp 97.9°F | Resp 16 | Wt 165.0 lb

## 2013-11-30 DIAGNOSIS — I10 Essential (primary) hypertension: Secondary | ICD-10-CM

## 2013-11-30 DIAGNOSIS — M2550 Pain in unspecified joint: Secondary | ICD-10-CM | POA: Insufficient documentation

## 2013-11-30 DIAGNOSIS — E790 Hyperuricemia without signs of inflammatory arthritis and tophaceous disease: Secondary | ICD-10-CM

## 2013-11-30 DIAGNOSIS — Z800 Family history of malignant neoplasm of digestive organs: Secondary | ICD-10-CM

## 2013-11-30 DIAGNOSIS — E785 Hyperlipidemia, unspecified: Secondary | ICD-10-CM

## 2013-11-30 LAB — CBC WITH DIFFERENTIAL/PLATELET
Basophils Relative: 0.5 % (ref 0.0–3.0)
EOS PCT: 9.3 % — AB (ref 0.0–5.0)
HCT: 33.5 % — ABNORMAL LOW (ref 36.0–46.0)
Hemoglobin: 11.1 g/dL — ABNORMAL LOW (ref 12.0–15.0)
LYMPHS PCT: 16.4 % (ref 12.0–46.0)
MCHC: 33 g/dL (ref 30.0–36.0)
MCV: 76.8 fl — AB (ref 78.0–100.0)
MONOS PCT: 4.1 % (ref 3.0–12.0)
Neutrophils Relative %: 69.7 % (ref 43.0–77.0)
Platelets: 403 10*3/uL — ABNORMAL HIGH (ref 150.0–400.0)
RBC: 4.36 Mil/uL (ref 3.87–5.11)
RDW: 15.2 % (ref 11.5–15.5)
WBC: 10.6 10*3/uL — ABNORMAL HIGH (ref 4.0–10.5)

## 2013-11-30 NOTE — Patient Instructions (Addendum)
Schedule your complete physical in 6 months We'll notify you of your lab results and make any changes if needed We'll call you with your Rheumatology appt We'll call you with your GI appt Call with any questions or concerns Hang in there!!

## 2013-11-30 NOTE — Assessment & Plan Note (Signed)
Chronic problem.  Pt has seen ortho, neurosurg, rheum w/o relief.  Unable to take NSAIDs due to solitary kidney.  Pt reports hx of + RF.  Will refer to Rheum at [name] (pt's preference) for 2nd opinion.

## 2013-11-30 NOTE — Assessment & Plan Note (Signed)
Chronic problem.  Adequate control.  Asymptomatic.  Check labs.  No anticipated med changes 

## 2013-11-30 NOTE — Progress Notes (Signed)
   Subjective:    Patient ID: Marissa Scott, female    DOB: 04/08/1965, 48 y.o.   MRN: [id number]  HPI HTN- chronic problem.  On Enalapril.  No CP, SOB, HAs, visual changes, edema.  Hyperlipidemia- chronic problem, on Fenofibrate intermittently.  Refusing to take Niaspan.  Denies abd pain, N/V.  Chronic back pain- seeing Neurosurg (Dr Carloyn Manner).  On pain meds w/o relief.  Pt has had epidural injxns and nerve ablation.  No relief w/ Lyrica.  Hyperuricemia- chronic problem, previously on allopurinol.  Not currently on any medication.  Migratory pain in joints.   Review of Systems For ROS see HPI     Objective:   Physical Exam  Vitals reviewed. Constitutional: She is oriented to person, place, and time. She appears well-developed and well-nourished. No distress.  HENT:  Head: Normocephalic and atraumatic.  Eyes: Conjunctivae and EOM are normal. Pupils are equal, round, and reactive to light.  Neck: Normal range of motion. Neck supple. No thyromegaly present.  Cardiovascular: Normal rate, regular rhythm, normal heart sounds and intact distal pulses.   No murmur heard. Pulmonary/Chest: Effort normal and breath sounds normal. No respiratory distress.  Abdominal: Soft. She exhibits no distension. There is no tenderness.  Musculoskeletal: She exhibits no edema.  Lymphadenopathy:    She has no cervical adenopathy.  Neurological: She is alert and oriented to person, place, and time.  Skin: Skin is warm and dry.  Psychiatric: She has a normal mood and affect. Her behavior is normal.          Assessment & Plan:

## 2013-11-30 NOTE — Assessment & Plan Note (Signed)
Chronic problem.  Pt is only taking Fenofibrate intermittently due to joint pains.  Check labs.  Adjust meds prn.

## 2013-11-30 NOTE — Progress Notes (Signed)
Pre visit review using our clinic review tool, if applicable. No additional management support is needed unless otherwise documented below in the visit note. 

## 2013-11-30 NOTE — Assessment & Plan Note (Signed)
Chronic problem.  Pt previously on Allopurinol but 'it didn't do anything'.  Check labs.  Start uric acid lowering meds prn.

## 2013-12-07 ENCOUNTER — Telehealth: Payer: Self-pay | Admitting: Lab

## 2013-12-07 NOTE — Telephone Encounter (Signed)
Dr. Birdie Riddle  We called [facility] they stated they could not find the green top on this patient. The patient will have to come back in for a redraw.    Sorry  S.T

## 2013-12-07 NOTE — Telephone Encounter (Signed)
Called pt and lmovm to return call.

## 2013-12-07 NOTE — Telephone Encounter (Signed)
Please have pt return for repeat lab draw at her convenience due to lost tube.

## 2013-12-07 NOTE — Telephone Encounter (Signed)
-----   Message from Midge Minium, MD sent at 12/05/2013  8:09 AM EST ----- Please check on labs- only one resulted is the CBC  Thanks! kt ----- Message -----    From: SYSTEM    Sent: 12/05/2013  12:04 AM      To: Midge Minium, MD

## 2013-12-12 ENCOUNTER — Other Ambulatory Visit: Payer: Self-pay | Admitting: Endocrinology

## 2013-12-13 ENCOUNTER — Ambulatory Visit: Payer: Medicare Other | Admitting: Endocrinology

## 2013-12-13 ENCOUNTER — Other Ambulatory Visit: Payer: Self-pay | Admitting: General Practice

## 2013-12-13 DIAGNOSIS — E781 Pure hyperglyceridemia: Secondary | ICD-10-CM

## 2013-12-13 MED ORDER — FENOFIBRATE 160 MG PO TABS: 160.0000 mg | ORAL_TABLET | Freq: Every day | ORAL | Status: DC

## 2013-12-13 MED ORDER — CARVEDILOL 12.5 MG PO TABS: ORAL_TABLET | ORAL | Status: DC

## 2013-12-15 ENCOUNTER — Other Ambulatory Visit (INDEPENDENT_AMBULATORY_CARE_PROVIDER_SITE_OTHER): Payer: Medicare Other

## 2013-12-15 ENCOUNTER — Other Ambulatory Visit: Payer: Self-pay | Admitting: General Practice

## 2013-12-15 DIAGNOSIS — E790 Hyperuricemia without signs of inflammatory arthritis and tophaceous disease: Secondary | ICD-10-CM

## 2013-12-15 DIAGNOSIS — I10 Essential (primary) hypertension: Secondary | ICD-10-CM

## 2013-12-15 DIAGNOSIS — E785 Hyperlipidemia, unspecified: Secondary | ICD-10-CM

## 2013-12-15 DIAGNOSIS — R7989 Other specified abnormal findings of blood chemistry: Secondary | ICD-10-CM

## 2013-12-15 LAB — URIC ACID: Uric Acid, Serum: 9.5 mg/dL — ABNORMAL HIGH (ref 2.4–7.0)

## 2013-12-15 LAB — LIPID PANEL
CHOLESTEROL: 233 mg/dL — AB (ref 0–200)
HDL: 26.5 mg/dL — AB (ref >39.00)
NonHDL: 206.5
Total CHOL/HDL Ratio: 9
VLDL: 136 mg/dL — AB (ref 0.0–40.0)

## 2013-12-15 LAB — BASIC METABOLIC PANEL
BUN: 27 mg/dL — AB (ref 6–23)
CALCIUM: 8.9 mg/dL (ref 8.4–10.5)
CO2: 21 mEq/L (ref 19–32)
CREATININE: 1.4 mg/dL — AB (ref 0.4–1.2)
Chloride: 102 mEq/L (ref 96–112)
GFR: 42.74 mL/min — AB (ref >60.00)
GLUCOSE: 293 mg/dL — AB (ref 70–99)
Potassium: 4.6 mEq/L (ref 3.5–5.1)
Sodium: 134 mEq/L — ABNORMAL LOW (ref 135–145)

## 2013-12-15 LAB — HEPATIC FUNCTION PANEL
ALBUMIN: 3.4 g/dL — AB (ref 3.5–5.2)
ALT: 10 U/L (ref 0–35)
AST: 11 U/L (ref 0–37)
Alkaline Phosphatase: 69 U/L (ref 39–117)
Bilirubin, Direct: 0 mg/dL (ref 0.0–0.3)
Total Bilirubin: 0.3 mg/dL (ref 0.2–1.2)
Total Protein: 7.4 g/dL (ref 6.0–8.3)

## 2013-12-15 LAB — LDL CHOLESTEROL, DIRECT: Direct LDL: 92.3 mg/dL

## 2013-12-15 MED ORDER — ALLOPURINOL 100 MG PO TABS: 100.0000 mg | ORAL_TABLET | Freq: Every day | ORAL | Status: DC

## 2013-12-21 ENCOUNTER — Encounter: Payer: Self-pay | Admitting: Internal Medicine

## 2014-01-01 LAB — HM PAP SMEAR: HM PAP: NORMAL

## 2014-01-10 ENCOUNTER — Other Ambulatory Visit: Payer: Self-pay

## 2014-01-10 DIAGNOSIS — Z1231 Encounter for screening mammogram for malignant neoplasm of breast: Secondary | ICD-10-CM

## 2014-01-15 ENCOUNTER — Other Ambulatory Visit: Payer: Self-pay | Admitting: Endocrinology

## 2014-01-15 ENCOUNTER — Other Ambulatory Visit (INDEPENDENT_AMBULATORY_CARE_PROVIDER_SITE_OTHER): Payer: Medicare Other

## 2014-01-15 DIAGNOSIS — R7989 Other specified abnormal findings of blood chemistry: Secondary | ICD-10-CM

## 2014-01-15 DIAGNOSIS — R748 Abnormal levels of other serum enzymes: Secondary | ICD-10-CM

## 2014-01-15 LAB — BASIC METABOLIC PANEL
BUN: 40 mg/dL — ABNORMAL HIGH (ref 6–23)
CALCIUM: 9.9 mg/dL (ref 8.4–10.5)
CHLORIDE: 102 meq/L (ref 96–112)
CO2: 27 meq/L (ref 19–32)
CREATININE: 1.7 mg/dL — AB (ref 0.4–1.2)
GFR: 33.41 mL/min — ABNORMAL LOW (ref >60.00)
GLUCOSE: 165 mg/dL — AB (ref 70–99)
Potassium: 4.9 mEq/L (ref 3.5–5.1)
Sodium: 136 mEq/L (ref 135–145)

## 2014-01-19 ENCOUNTER — Telehealth: Payer: Self-pay | Admitting: Family Medicine

## 2014-01-19 ENCOUNTER — Other Ambulatory Visit: Payer: Self-pay | Admitting: Family Medicine

## 2014-01-19 DIAGNOSIS — R7989 Other specified abnormal findings of blood chemistry: Secondary | ICD-10-CM

## 2014-01-19 NOTE — Telephone Encounter (Signed)
Pt advised [place] she sees Dr> Lorrene Reid. Recent labs were sent as requested by pt and provider.

## 2014-01-19 NOTE — Telephone Encounter (Signed)
Received fax from [facility], stating that Dr Birdie Riddle had referred patient. Wanting [place] to do referral form and fax records. Do not see referral or a phone note on this, please advise.

## 2014-01-19 NOTE — Telephone Encounter (Signed)
New referral placed since [facility]'s office states they have never seen pt.

## 2014-02-02 LAB — HM MAMMOGRAPHY: HM Mammogram: NORMAL

## 2014-02-05 ENCOUNTER — Encounter: Payer: Self-pay | Admitting: Nutrition

## 2014-02-05 ENCOUNTER — Encounter: Payer: Self-pay | Admitting: Endocrinology

## 2014-02-05 ENCOUNTER — Ambulatory Visit (INDEPENDENT_AMBULATORY_CARE_PROVIDER_SITE_OTHER): Payer: Medicare Other | Admitting: Endocrinology

## 2014-02-05 VITALS — BP 134/88 | HR 80 | Temp 98.5°F | Ht 60.0 in | Wt 159.0 lb

## 2014-02-05 DIAGNOSIS — N189 Chronic kidney disease, unspecified: Secondary | ICD-10-CM

## 2014-02-05 DIAGNOSIS — E1122 Type 2 diabetes mellitus with diabetic chronic kidney disease: Secondary | ICD-10-CM

## 2014-02-05 LAB — HEMOGLOBIN A1C
Hgb A1c MFr Bld: 8.3 % — ABNORMAL HIGH (ref <5.7)
Mean Plasma Glucose: 192 mg/dL — ABNORMAL HIGH (ref <117)

## 2014-02-05 NOTE — Patient Instructions (Signed)
blood and urine tests are being requested for you today.  We'll let you know about the results. If it is high, we can add "acarbose."    Please come back for a follow-up appointment in 3 months.   check your blood sugar once a day.  vary the time of day when you check, between before the 3 meals, and at bedtime.  also check if you have symptoms of your blood sugar being too high or too low.  please keep a record of the readings and bring it to your next appointment here.  please call us sooner if your blood sugar goes below 70, or if you have a lot of readings over 200.   Please continue to work with your doctors about your pain, as the steroids complicate the treatment of your blood sugar.

## 2014-02-05 NOTE — Progress Notes (Signed)
Subjective:    Patient ID: Marissa Scott, female    DOB: 1965/08/26, 49 y.o.   MRN: [id number]  HPI  Pt returns for f/u of diabetes mellitus: DM type: 2 Dx'ed: [id number] Complications: renal insufficiency Therapy: 3 oral meds GDM: never DKA: never Severe hypoglycemia: never Pancreatitis: never Other: she has never been on insulin; she did not tolerate actos due to arthralgias; she cannot take metformin due to renal insufficiency; she says she can afford name-brand meds only on a limited basis Interval history: she has not recently checked cbg's, but she reports occasional sxs of mild hypoglycemia.  She last took steroids 1 month ago, but low-back pain.  She ran out of cycloset 1 week ago.   Past Medical History  Diagnosis Date  . RENAL DISEASE, CHRONIC, STAGE III 01/30/2008    Follows w/ Renal  . Polycystic ovaries 01/04/2009  . HYPERURICEMIA 10/01/2008  . HYPERTENSION 01/30/2008  . HYPERLIPIDEMIA 01/30/2008  . DIABETES MELLITUS, TYPE II 01/30/2008    Past Surgical History  Procedure Laterality Date  . Lumbar laminectomy      L4-L5  . Nephrectomy      left removed, partial right-Ottelin  . Appendectomy    . Tubal ligation      GYN [facility]  . Right hand cyst    . Neural stimulator      History   Social History  . Marital Status: Married    Spouse Name: N/A    Number of Children: N/A  . Years of Education: N/A   Occupational History  . Disabled    Social History Main Topics  . Smoking status: Never Smoker   . Smokeless tobacco: Not on file  . Alcohol Use: No  . Drug Use: Not on file  . Sexual Activity: Not on file   Other Topics Concern  . Not on file   Social History Narrative   Disabled for back pain    Current Outpatient Prescriptions on File Prior to Visit  Medication Sig Dispense Refill  . allopurinol (ZYLOPRIM) 100 MG tablet Take 1 tablet (100 mg total) by mouth daily. 30 tablet 6  . Bromocriptine Mesylate (CYCLOSET) 0.8 MG TABS Take 1 tablet by mouth  at bedtime.    . carvedilol (COREG) 12.5 MG tablet TAKE ONE TABLET BY MOUTH TWICE DAILY WITH MEALS 30 tablet 3  . enalapril (VASOTEC) 10 MG tablet Take 0.5 tablets (5 mg total) by mouth 2 (two) times daily. 30 tablet 3  . fenofibrate 160 MG tablet Take 1 tablet (160 mg total) by mouth daily. 30 tablet 3  . furosemide (LASIX) 20 MG tablet Take 20 mg by mouth as needed.      Marland Kitchen glimepiride (AMARYL) 2 MG tablet TAKE 1 TAB ONCE A DAY BEFORE BREAKFAST 30 tablet 0  . glucose blood (RELION GLUCOSE TEST STRIPS) test strip 1 each by Other route daily. And lancets 1/day 250.00 100 each 12  . HYDROmorphone (DILAUDID) 2 MG tablet Take 1 tablet by mouth every 6 (six) hours.    . Lorcaserin HCl (BELVIQ) 10 MG TABS Take 1 tablet by mouth 2 (two) times daily. 60 tablet 3  . saxagliptin HCl (ONGLYZA) 2.5 MG TABS tablet Take 1 tablet (2.5 mg total) by mouth daily. 30 tablet 11  . niacin (NIASPAN) 1000 MG CR tablet Take 1 tablet (1,000 mg total) by mouth at bedtime. Start this medication after 1 month of niaspan 500mg . (Patient not taking: Reported on 02/05/2014) 30 tablet 3   No  current facility-administered medications on file prior to visit.    Allergies  Allergen Reactions  . Pioglitazone     arthralgias  . Penicillins Rash    Family History  Problem Relation Age of Onset  . Hypertension Father   . Heart disease Father     CAD  . Diabetes Father   . Stroke Father   . Cancer Father     Colon Cancer  . Cancer Cousin     Lynch Syndrome    BP 134/88 mmHg  Pulse 80  Temp(Src) 98.5 F (36.9 C) (Oral)  Ht 5' (1.524 m)  Wt 159 lb (72.122 kg)  BMI 31.05 kg/m2  SpO2 98%  Review of Systems Denies LOC and weight gain.       Objective:   Physical Exam VITAL SIGNS:  See vs page GENERAL: no distress Pulses: dorsalis pedis intact bilat.   MSK: no deformity of the feet CV: no leg edema Skin:  no ulcer on the feet.  normal color and temp on the feet. Neuro: sensation is intact to touch on the  feet.    Lab Results  Component Value Date   HGBA1C 8.3* 02/05/2014      Assessment & Plan:  DM: moderate exacerbation.  Pt says she'll consider insulin if necessary.  (I discussed with Leonia Reader, RN). Arthralgias: steroids are complicating the rx of DM.    Patient is advised the following: Patient Instructions  blood and urine tests are being requested for you today.  We'll let you know about the results. If it is high, we can add "acarbose."    Please come back for a follow-up appointment in 3 months.   check your blood sugar once a day.  vary the time of day when you check, between before the 3 meals, and at bedtime.  also check if you have symptoms of your blood sugar being too high or too low.  please keep a record of the readings and bring it to your next appointment here.  please call us sooner if your blood sugar goes below 70, or if you have a lot of readings over 200.   Please continue to work with your doctors about your pain, as the steroids complicate the treatment of your blood sugar.     start acarbose. Please consider taking the insulin. Let me know if you decide to take it.

## 2014-02-06 LAB — MICROALBUMIN / CREATININE URINE RATIO
CREATININE, URINE: 197 mg/dL
MICROALB UR: 114.1 mg/dL — AB (ref <2.0)
Microalb Creat Ratio: 579.2 mg/g — ABNORMAL HIGH (ref 0.0–30.0)

## 2014-02-06 MED ORDER — BROMOCRIPTINE MESYLATE 0.8 MG PO TABS: 1.0000 | ORAL_TABLET | Freq: Every day | ORAL | Status: DC

## 2014-02-06 MED ORDER — ACARBOSE 50 MG PO TABS: 50.0000 mg | ORAL_TABLET | Freq: Every day | ORAL | Status: DC

## 2014-02-07 ENCOUNTER — Ambulatory Visit
Admission: RE | Admit: 2014-02-07 | Discharge: 2014-02-07 | Disposition: A | Discharge status: Home / Self Care | Payer: Medicare Other | Source: Ambulatory Visit

## 2014-02-07 DIAGNOSIS — Z1231 Encounter for screening mammogram for malignant neoplasm of breast: Secondary | ICD-10-CM

## 2014-02-19 ENCOUNTER — Ambulatory Visit (AMBULATORY_SURGERY_CENTER): Payer: Self-pay | Admitting: *Deleted

## 2014-02-19 VITALS — Ht 60.0 in | Wt 156.0 lb

## 2014-02-19 DIAGNOSIS — Z800 Family history of malignant neoplasm of digestive organs: Secondary | ICD-10-CM

## 2014-02-19 NOTE — Progress Notes (Signed)
No egg or soy allergy  No anesthesia or intubation problems per pt  No diet medications taken  Registered in [place]  For financial reasons, Miralax split prep given

## 2014-03-05 ENCOUNTER — Ambulatory Visit (AMBULATORY_SURGERY_CENTER): Payer: Medicare Other | Admitting: Internal Medicine

## 2014-03-05 ENCOUNTER — Encounter: Payer: Self-pay | Admitting: Internal Medicine

## 2014-03-05 VITALS — BP 146/80 | HR 68 | Temp 97.9°F | Resp 16 | Ht 60.0 in | Wt 156.0 lb

## 2014-03-05 DIAGNOSIS — Z800 Family history of malignant neoplasm of digestive organs: Secondary | ICD-10-CM

## 2014-03-05 DIAGNOSIS — Z1211 Encounter for screening for malignant neoplasm of colon: Secondary | ICD-10-CM

## 2014-03-05 DIAGNOSIS — D122 Benign neoplasm of ascending colon: Secondary | ICD-10-CM

## 2014-03-05 HISTORY — PX: COLONOSCOPY: SHX174

## 2014-03-05 LAB — GLUCOSE, CAPILLARY
Glucose-Capillary: 146 mg/dL — ABNORMAL HIGH (ref 70–99)
Glucose-Capillary: 140 mg/dL — ABNORMAL HIGH (ref 70–99)

## 2014-03-05 MED ORDER — SODIUM CHLORIDE 0.9 % IV SOLN: 500.0000 mL | INTRAVENOUS | Status: DC

## 2014-03-05 NOTE — Progress Notes (Signed)
Pt has a neural stimulator in her spine.  The battery is in her right gluteous

## 2014-03-05 NOTE — Op Note (Signed)
[place]  [address] [place], [address]   COLONOSCOPY PROCEDURE REPORT  PATIENT: Marissa Scott, Marissa Scott  MR#: [id number] BIRTHDATE: 01-19-1966 , 48  yrs. old GENDER: female ENDOSCOPIST: Eustace Quail, MD REFERRED [id number] Birdie Riddle, M.D. PROCEDURE DATE:  03/05/2014 PROCEDURE:   Colonoscopy with snare polypectomy x 1 First Screening Colonoscopy - Avg.  risk and is 50 yrs.  old or older Yes.  Prior Negative Screening - Now for repeat screening. N/A  History of Adenoma - Now for follow-up colonoscopy & has been > or = to 3 yrs.  N/A  Polyps Removed Today? Yes. ASA CLASS:   Class II INDICATIONS:patient's immediate family history of colon cancer-- father around age 100. MEDICATIONS: Monitored anesthesia care and Propofol 200 mg IV  DESCRIPTION OF PROCEDURE:   After the risks benefits and alternatives of the procedure were thoroughly explained, informed consent was obtained.  The digital rectal exam revealed no abnormalities of the rectum.   The [id number]  endoscope was introduced through the anus and advanced to the cecum, which was identified by both the appendix and ileocecal valve. No adverse events experienced.   The quality of the prep was excellent, using MoviPrep  The instrument was then slowly withdrawn as the colon was fully examined.     COLON FINDINGS: Melanosis coli was found throughout the entire examined colon.   A single polyp measuring 6 mm in size was found in the ascending colon.  A polypectomy was performed with a cold snare.  The resection was complete, the polyp tissue was completely retrieved and sent to histology.   The examination was otherwise normal.  Retroflexed views revealed no abnormalities. The time to cecum=3 minutes 17 seconds.  Withdrawal time=7 minutes 33 seconds. The scope was withdrawn and the procedure completed. COMPLICATIONS: There were no immediate complications.  ENDOSCOPIC IMPRESSION: 1.   Melanosis  coli 2.   Single polyp measuring 6 mm in size was found in the ascending colon; polypectomy was performed with a cold snare 3.   The examination was otherwise normal  RECOMMENDATIONS: 1. Follow up colonoscopy in 5 years  eSigned:  Eustace Quail, MD 03/05/2014 9:41 AM   cc: Annye Asa MD and The Patient

## 2014-03-05 NOTE — Progress Notes (Signed)
Called to room to assist during endoscopic procedure.  Patient ID and intended procedure confirmed with present staff. Received instructions for my participation in the procedure from the performing physician.  

## 2014-03-05 NOTE — Patient Instructions (Signed)

## 2014-03-05 NOTE — Progress Notes (Signed)
Report to PACU, RN, vss, BBS= Clear.  

## 2014-03-06 ENCOUNTER — Telehealth: Payer: Self-pay

## 2014-03-06 NOTE — Telephone Encounter (Signed)
  Follow up Call-  Call back number 03/05/2014  Post procedure Call Back phone  # [phone]  Permission to leave phone message Yes     Patient questions:  Do you have a fever, pain , or abdominal swelling? No. Pain Score  0 *  Have you tolerated food without any problems? Yes.    Have you been able to return to your normal activities? Yes.    Do you have any questions about your discharge instructions: Diet   No. Medications  No. Follow up visit  No.  Do you have questions or concerns about your Care? No.  Actions: * If pain score is 4 or above: No action needed, pain <4.

## 2014-03-08 ENCOUNTER — Encounter: Payer: Self-pay | Admitting: Internal Medicine

## 2014-04-19 ENCOUNTER — Telehealth: Payer: Self-pay | Admitting: Family Medicine

## 2014-04-19 NOTE — Telephone Encounter (Signed)
CPE letter sent °

## 2014-05-07 ENCOUNTER — Ambulatory Visit (INDEPENDENT_AMBULATORY_CARE_PROVIDER_SITE_OTHER): Payer: Medicare Other | Admitting: Family Medicine

## 2014-05-07 ENCOUNTER — Encounter: Payer: Self-pay | Admitting: Family Medicine

## 2014-05-07 VITALS — BP 140/80 | HR 73 | Temp 98.1°F | Resp 16 | Ht 60.75 in | Wt 159.1 lb

## 2014-05-07 DIAGNOSIS — E785 Hyperlipidemia, unspecified: Secondary | ICD-10-CM

## 2014-05-07 DIAGNOSIS — Z0000 Encounter for general adult medical examination without abnormal findings: Secondary | ICD-10-CM | POA: Diagnosis not present

## 2014-05-07 DIAGNOSIS — N183 Chronic kidney disease, stage 3 unspecified: Secondary | ICD-10-CM

## 2014-05-07 DIAGNOSIS — I10 Essential (primary) hypertension: Secondary | ICD-10-CM | POA: Diagnosis not present

## 2014-05-07 DIAGNOSIS — E1122 Type 2 diabetes mellitus with diabetic chronic kidney disease: Secondary | ICD-10-CM

## 2014-05-07 DIAGNOSIS — N189 Chronic kidney disease, unspecified: Secondary | ICD-10-CM

## 2014-05-07 LAB — LIPID PANEL
Cholesterol: 202 mg/dL — ABNORMAL HIGH (ref 0–200)
HDL: 30.7 mg/dL — AB (ref >39.00)
Total CHOL/HDL Ratio: 7

## 2014-05-07 LAB — CBC WITH DIFFERENTIAL/PLATELET
BASOS ABS: 0 10*3/uL (ref 0.0–0.1)
Basophils Relative: 0.4 % (ref 0.0–3.0)
EOS ABS: 0.9 10*3/uL — AB (ref 0.0–0.7)
EOS PCT: 7.5 % — AB (ref 0.0–5.0)
HCT: 35.4 % — ABNORMAL LOW (ref 36.0–46.0)
Hemoglobin: 11.5 g/dL — ABNORMAL LOW (ref 12.0–15.0)
LYMPHS ABS: 2.6 10*3/uL (ref 0.7–4.0)
Lymphocytes Relative: 21.3 % (ref 12.0–46.0)
MCHC: 32.4 g/dL (ref 30.0–36.0)
MCV: 75.7 fl — AB (ref 78.0–100.0)
MONO ABS: 0.6 10*3/uL (ref 0.1–1.0)
Monocytes Relative: 4.6 % (ref 3.0–12.0)
Neutro Abs: 8 10*3/uL — ABNORMAL HIGH (ref 1.4–7.7)
Neutrophils Relative %: 66.2 % (ref 43.0–77.0)
PLATELETS: 339 10*3/uL (ref 150.0–400.0)
RBC: 4.68 Mil/uL (ref 3.87–5.11)
RDW: 16.1 % — AB (ref 11.5–15.5)
WBC: 12 10*3/uL — ABNORMAL HIGH (ref 4.0–10.5)

## 2014-05-07 LAB — BASIC METABOLIC PANEL
BUN: 30 mg/dL — AB (ref 6–23)
CHLORIDE: 104 meq/L (ref 96–112)
CO2: 19 mEq/L (ref 19–32)
Calcium: 10 mg/dL (ref 8.4–10.5)
Creatinine, Ser: 1.48 mg/dL — ABNORMAL HIGH (ref 0.40–1.20)
GFR: 39.69 mL/min — ABNORMAL LOW (ref >60.00)
GLUCOSE: 116 mg/dL — AB (ref 70–99)
POTASSIUM: 4.8 meq/L (ref 3.5–5.1)
Sodium: 134 mEq/L — ABNORMAL LOW (ref 135–145)

## 2014-05-07 LAB — HEPATIC FUNCTION PANEL
ALBUMIN: 3.8 g/dL (ref 3.5–5.2)
ALT: 14 U/L (ref 0–35)
AST: 24 U/L (ref 0–37)
Alkaline Phosphatase: 58 U/L (ref 39–117)
Bilirubin, Direct: 0.1 mg/dL (ref 0.0–0.3)
TOTAL PROTEIN: 7 g/dL (ref 6.0–8.3)
Total Bilirubin: 0.3 mg/dL (ref 0.2–1.2)

## 2014-05-07 LAB — LDL CHOLESTEROL, DIRECT: LDL DIRECT: 83 mg/dL

## 2014-05-07 LAB — TSH: TSH: 6.4 u[IU]/mL — ABNORMAL HIGH (ref 0.35–4.50)

## 2014-05-07 NOTE — Progress Notes (Signed)
Pre visit review using our clinic review tool, if applicable. No additional management support is needed unless otherwise documented below in the visit note. 

## 2014-05-07 NOTE — Progress Notes (Signed)
   Subjective:    Patient ID: Marissa Scott, female    DOB: October 10, 1965, 49 y.o.   MRN: [id number]  HPI Here today for CPE.  Risk Factors: HTN- chronic problem, on Coreg, Enalapril Hyperlipidemia- chronic problem, on Fenofibrate.  Not currently on statin b/c LDL has been at goal. Renal disease- chronic problem, following w/ [facility] Diabetes- following w/ Dr Loanne Drilling Physical Activity: limited activity due to ongoing orthopedic issues w/ L ankle (seeing [facility]) Fall Risk: low Depression: denies current sxs Hearing: normal to conversational tones and whispered voice at 6 ft ADL's: independent Cognitive: normal linear thought process, memory and attention intact Home Safety: safe at home Height, Weight, BMI, Visual Acuity: see vitals, vision corrected to 20/20 w/ Lasix and readers Counseling: UTD on colonoscopy, mammo, pap (GYN), eye exam. Labs Ordered: See A&P Care Plan: See A&P    Review of Systems Patient reports no vision/ hearing changes, adenopathy,fever, weight change,  persistant/recurrent hoarseness , swallowing issues, chest pain, palpitations, edema, persistant/recurrent cough, hemoptysis, dyspnea (rest/exertional/paroxysmal nocturnal), gastrointestinal bleeding (melena, rectal bleeding), abdominal pain, significant heartburn, bowel changes, GU symptoms (dysuria, hematuria, incontinence), Gyn symptoms (abnormal  bleeding, pain), focal weakness, memory loss, numbness & tingling, skin/hair/nail changes, abnormal bruising or bleeding, anxiety, or depression.   + syncopal episodes intermittently- pt refusing work up at this time    Objective:   Physical Exam General Appearance:    Alert, cooperative, no distress, appears stated age  Head:    Normocephalic, without obvious abnormality, atraumatic  Eyes:    PERRL, conjunctiva/corneas clear, EOM's intact, fundi    benign, both eyes  Ears:    Normal TM's and external ear canals, both ears  Nose:   Nares normal, septum  midline, mucosa normal, no drainage    or sinus tenderness  Throat:   Lips, mucosa, and tongue normal; teeth and gums normal  Neck:   Supple, symmetrical, trachea midline, no adenopathy;    Thyroid: no enlargement/tenderness/nodules  Back:     Symmetric, no curvature, ROM normal, no CVA tenderness  Lungs:     Clear to auscultation bilaterally, respirations unlabored  Chest Wall:    No tenderness or deformity   Heart:    Regular rate and rhythm, S1 and S2 normal, no murmur, rub   or gallop  Breast Exam:    Deferred to GYN  Abdomen:     Soft, non-tender, bowel sounds active all four quadrants,    no masses, no organomegaly  Genitalia:    Deferred to GYN  Rectal:    Extremities:   Extremities normal, atraumatic, no cyanosis or edema  Pulses:   2+ and symmetric all extremities  Skin:   Skin color, texture, turgor normal, no rashes or lesions  Lymph nodes:   Cervical, supraclavicular, and axillary nodes normal  Neurologic:   CNII-XII intact, normal strength, sensation and reflexes    throughout          Assessment & Plan:

## 2014-05-07 NOTE — Patient Instructions (Signed)
Follow up in 6 months to recheck BP and cholesterol We'll notify you of your lab results and make any changes if needed Try and make healthy food choices and get regular exercise as your joints allow Call with any questions or concerns [place]!!!

## 2014-05-08 ENCOUNTER — Other Ambulatory Visit: Payer: Self-pay | Admitting: Family Medicine

## 2014-05-08 ENCOUNTER — Ambulatory Visit: Payer: Self-pay | Admitting: Endocrinology

## 2014-05-08 DIAGNOSIS — D72829 Elevated white blood cell count, unspecified: Secondary | ICD-10-CM

## 2014-05-08 DIAGNOSIS — E785 Hyperlipidemia, unspecified: Secondary | ICD-10-CM

## 2014-05-08 DIAGNOSIS — R7989 Other specified abnormal findings of blood chemistry: Secondary | ICD-10-CM

## 2014-05-08 MED ORDER — LEVOTHYROXINE SODIUM 50 MCG PO TABS: 50.0000 ug | ORAL_TABLET | Freq: Every day | ORAL | Status: DC

## 2014-05-08 MED ORDER — ATORVASTATIN CALCIUM 20 MG PO TABS: 20.0000 mg | ORAL_TABLET | Freq: Every day | ORAL | Status: DC

## 2014-05-16 ENCOUNTER — Other Ambulatory Visit: Payer: Self-pay | Admitting: Family Medicine

## 2014-05-16 NOTE — Telephone Encounter (Signed)
Med filled.  

## 2014-05-30 ENCOUNTER — Telehealth: Payer: Self-pay | Admitting: Endocrinology

## 2014-05-30 NOTE — Telephone Encounter (Signed)
Patient called and would like to try Actos  She states that the cycloset is too pricey for her    Pharmacy: [facility] on [address]   Thank you

## 2014-05-30 NOTE — Telephone Encounter (Signed)
Ov is due. We need to do several things at that ov, to decide next step, although i'm happy to consider the actos

## 2014-05-30 NOTE — Telephone Encounter (Signed)
See note below and please advise, Thanks! 

## 2014-05-30 NOTE — Telephone Encounter (Signed)
I contacted the patient and advised of the note below. She is scheduled for 07/03/2014 for a ov. Pt stated she would like to wait until that office visit to address because she states financially she cannot come in earlier for a appointment.

## 2014-06-03 NOTE — Assessment & Plan Note (Signed)
Chronic problem.  Following w/ Dr Loanne Drilling.  UTD on eye exam.  On ACE

## 2014-06-03 NOTE — Assessment & Plan Note (Signed)
Chronic problem.  Following w/ Dr Lorrene Reid.  Will forward labs done today.

## 2014-06-03 NOTE — Assessment & Plan Note (Signed)
Chronic problem.  Tolerating statin w/o difficulty.  Check labs.  Adjust meds prn  

## 2014-06-03 NOTE — Assessment & Plan Note (Signed)
Pt's PE WNL and unchanged from previous.  UTD on GYN, mammo, colonoscopy.  Written screening schedule updated and given to pt. Check labs.  Anticipatory guidance provided.

## 2014-06-03 NOTE — Assessment & Plan Note (Signed)
Chronic problem.  Adequate control.  Asymptomatic.  Check labs.  No anticipated med changes 

## 2014-07-03 ENCOUNTER — Ambulatory Visit (INDEPENDENT_AMBULATORY_CARE_PROVIDER_SITE_OTHER): Payer: Medicare Other | Admitting: Endocrinology

## 2014-07-03 ENCOUNTER — Encounter: Payer: Self-pay | Admitting: Endocrinology

## 2014-07-03 VITALS — BP 132/84 | HR 81 | Temp 98.2°F | Ht 60.75 in | Wt 161.0 lb

## 2014-07-03 DIAGNOSIS — E1122 Type 2 diabetes mellitus with diabetic chronic kidney disease: Secondary | ICD-10-CM | POA: Diagnosis not present

## 2014-07-03 DIAGNOSIS — N189 Chronic kidney disease, unspecified: Secondary | ICD-10-CM | POA: Diagnosis not present

## 2014-07-03 LAB — HEMOGLOBIN A1C: Hgb A1c MFr Bld: 6.9 % — ABNORMAL HIGH (ref 4.6–6.5)

## 2014-07-03 MED ORDER — PIOGLITAZONE HCL 30 MG PO TABS
30.0000 mg | ORAL_TABLET | Freq: Every day | ORAL | Status: DC
Start: 2014-07-03 — End: 2015-07-08

## 2014-07-03 NOTE — Progress Notes (Signed)
Subjective:    Patient ID: Marissa Scott, female    DOB: 1965-10-05, 49 y.o.   MRN: [id number]  HPI Pt returns for f/u of diabetes mellitus: DM type: 2 Dx'ed: [id number] Complications: renal insufficiency Therapy: none now GDM: never.  DKA: never Severe hypoglycemia: never Pancreatitis: never Other: she has never been on insulin; she did not tolerate actos due to arthralgias; she cannot take metformin due to renal insufficiency; she says she can afford name-brand meds only on a limited basis.   Interval history: no cbg record, but states cbg's vary from 77-220.  She is back on steroids for low-back pain.  she cannot afford cycloset.  She did not tolerate acarbose.  She wants to retry actos--she has resumed taking this from an old supply. Past Medical History  Diagnosis Date  . RENAL DISEASE, CHRONIC, STAGE III 01/30/2008    Follows w/ Renal  . Polycystic ovaries 01/04/2009  . HYPERURICEMIA 10/01/2008  . HYPERTENSION 01/30/2008  . HYPERLIPIDEMIA 01/30/2008  . DIABETES MELLITUS, TYPE II 01/30/2008  . Arthritis     Past Surgical History  Procedure Laterality Date  . Lumbar laminectomy      L4-L5  . Nephrectomy      left removed, partial right-Ottelin  . Appendectomy    . Tubal ligation      [facility]N [facility]  . Right hand cyst    . Neural stimulator      History   Social History  . Marital Status: Married    Spouse Name: N/A  . Number of Children: N/A  . Years of Education: N/A   Occupational History  . Disabled    Social History Main Topics  . Smoking status: Never Smoker   . Smokeless tobacco: Never Used  . Alcohol Use: Yes     Comment: rarely  . Drug Use: No  . Sexual Activity: Not on file   Other Topics Concern  . Not on file   Social History Narrative   Disabled for back pain    Current Outpatient Prescriptions on File Prior to Visit  Medication Sig Dispense Refill  . acarbose (PRECOSE) 50 MG tablet Take 1 tablet (50 mg total) by mouth daily with supper. 30  tablet 11  . allopurinol (ZYLOPRIM) 100 MG tablet Take 1 tablet (100 mg total) by mouth daily. 30 tablet 6  . atorvastatin (LIPITOR) 20 MG tablet Take 1 tablet (20 mg total) by mouth daily. 30 tablet 6  . Bromocriptine Mesylate (CYCLOSET) 0.8 MG TABS Take 1 tablet (0.8 mg total) by mouth at bedtime. 30 tablet 11  . carvedilol (COREG) 12.5 MG tablet TAKE ONE TABLET BY MOUTH TWICE DAILY WITH MEALS 60 tablet 6  . enalapril (VASOTEC) 10 MG tablet Take 0.5 tablets (5 mg total) by mouth 2 (two) times daily. 30 tablet 3  . fenofibrate 160 MG tablet Take 1 tablet (160 mg total) by mouth daily. 30 tablet 3  . furosemide (LASIX) 20 MG tablet Take 20 mg by mouth as needed.      Marland Kitchen HYDROmorphone (DILAUDID) 2 MG tablet Take 1 tablet by mouth every 6 (six) hours.    Marland Kitchen levothyroxine (SYNTHROID, LEVOTHROID) 50 MCG tablet Take 1 tablet (50 mcg total) by mouth daily. 30 tablet 6  . Multiple Vitamin (MULTI-VITAMINS) TABS Take 1 tablet by mouth daily.    Marland Kitchen oxyCODONE-acetaminophen (PERCOCET) 10-325 MG per tablet Take 1 tablet by mouth every 6 (six) hours as needed.     No current facility-administered medications on file  prior to visit.    Allergies  Allergen Reactions  . Pioglitazone     arthralgias  . Penicillins Rash    Family History  Problem Relation Age of Onset  . Hypertension Father   . Heart disease Father     CAD  . Diabetes Father   . Stroke Father   . Cancer Father     Colon Cancer  . Colon cancer Father 79  . Cancer Cousin 42    Lynch Syndrome  . Esophageal cancer Neg Hx   . Stomach cancer Neg Hx   . Rectal cancer Neg Hx   .       BP 132/84 mmHg  Pulse 81  Temp(Src) 98.2 F (36.8 C) (Oral)  Ht 5' 0.75" (1.543 m)  Wt 161 lb (73.029 kg)  BMI 30.67 kg/m2  SpO2 97%  Review of Systems She denies hypoglycemia.     Objective:   Physical Exam VITAL SIGNS:  See vs page GENERAL: no distress Pulses: dorsalis pedis intact bilat.   MSK: no deformity of the feet CV: no leg  edema Skin:  no ulcer on the feet.  normal color and temp on the feet. Neuro: sensation is intact to touch on the feet   Lab Results  Component Value Date   HGBA1C 6.9* 07/03/2014       Assessment & Plan:  DM: well-controlled, back on actos.  Patient is advised the following: Patient Instructions  A diabetes blood test is requested for you today.  We'll let you know about the results. If it is high, we can resume the actos.  Please come back for a follow-up appointment in 3 months.   check your blood sugar once a day.  vary the time of day when you check, between before the 3 meals, and at bedtime.  also check if you have symptoms of your blood sugar being too high or too low.  please keep a record of the readings and bring it to your next appointment here.  please call us sooner if your blood sugar goes below 70, or if you have a lot of readings over 200.   Please continue to work with your doctors about your pain, as the steroids complicate the treatment of your blood sugar.

## 2014-07-03 NOTE — Patient Instructions (Addendum)
A diabetes blood test is requested for you today.  We'll let you know about the results. If it is high, we can resume the actos.  Please come back for a follow-up appointment in 3 months.   check your blood sugar once a day.  vary the time of day when you check, between before the 3 meals, and at bedtime.  also check if you have symptoms of your blood sugar being too high or too low.  please keep a record of the readings and bring it to your next appointment here.  please call us sooner if your blood sugar goes below 70, or if you have a lot of readings over 200.   Please continue to work with your doctors about your pain, as the steroids complicate the treatment of your blood sugar.

## 2014-10-03 ENCOUNTER — Ambulatory Visit: Payer: Medicare Other | Admitting: Endocrinology

## 2014-10-11 ENCOUNTER — Emergency Department (HOSPITAL_COMMUNITY): Payer: Medicare Other

## 2014-10-11 ENCOUNTER — Emergency Department (HOSPITAL_COMMUNITY)
Admission: EM | Admit: 2014-10-11 | Discharge: 2014-10-12 | Disposition: A | Discharge status: Home / Self Care | Payer: Medicare Other | Attending: Emergency Medicine | Admitting: Emergency Medicine

## 2014-10-11 ENCOUNTER — Encounter (HOSPITAL_COMMUNITY): Payer: Self-pay | Admitting: Emergency Medicine

## 2014-10-11 DIAGNOSIS — M1990 Unspecified osteoarthritis, unspecified site: Secondary | ICD-10-CM | POA: Diagnosis not present

## 2014-10-11 DIAGNOSIS — Y92410 Unspecified street and highway as the place of occurrence of the external cause: Secondary | ICD-10-CM | POA: Diagnosis not present

## 2014-10-11 DIAGNOSIS — S299XXA Unspecified injury of thorax, initial encounter: Secondary | ICD-10-CM | POA: Diagnosis not present

## 2014-10-11 DIAGNOSIS — I129 Hypertensive chronic kidney disease with stage 1 through stage 4 chronic kidney disease, or unspecified chronic kidney disease: Secondary | ICD-10-CM | POA: Insufficient documentation

## 2014-10-11 DIAGNOSIS — Z880 Allergy status to penicillin: Secondary | ICD-10-CM | POA: Insufficient documentation

## 2014-10-11 DIAGNOSIS — S52571A Other intraarticular fracture of lower end of right radius, initial encounter for closed fracture: Secondary | ICD-10-CM | POA: Diagnosis not present

## 2014-10-11 DIAGNOSIS — E785 Hyperlipidemia, unspecified: Secondary | ICD-10-CM | POA: Diagnosis not present

## 2014-10-11 DIAGNOSIS — Z79899 Other long term (current) drug therapy: Secondary | ICD-10-CM | POA: Insufficient documentation

## 2014-10-11 DIAGNOSIS — Y998 Other external cause status: Secondary | ICD-10-CM | POA: Diagnosis not present

## 2014-10-11 DIAGNOSIS — T148XXA Other injury of unspecified body region, initial encounter: Secondary | ICD-10-CM

## 2014-10-11 DIAGNOSIS — S3991XA Unspecified injury of abdomen, initial encounter: Secondary | ICD-10-CM | POA: Diagnosis not present

## 2014-10-11 DIAGNOSIS — S5291XA Unspecified fracture of right forearm, initial encounter for closed fracture: Secondary | ICD-10-CM

## 2014-10-11 DIAGNOSIS — R000 Tachycardia, unspecified: Secondary | ICD-10-CM | POA: Diagnosis not present

## 2014-10-11 DIAGNOSIS — E119 Type 2 diabetes mellitus without complications: Secondary | ICD-10-CM | POA: Diagnosis not present

## 2014-10-11 DIAGNOSIS — N183 Chronic kidney disease, stage 3 (moderate): Secondary | ICD-10-CM | POA: Diagnosis not present

## 2014-10-11 DIAGNOSIS — T148 Other injury of unspecified body region: Secondary | ICD-10-CM | POA: Diagnosis not present

## 2014-10-11 DIAGNOSIS — S6991XA Unspecified injury of right wrist, hand and finger(s), initial encounter: Secondary | ICD-10-CM | POA: Diagnosis present

## 2014-10-11 DIAGNOSIS — Y9389 Activity, other specified: Secondary | ICD-10-CM | POA: Diagnosis not present

## 2014-10-11 LAB — CBC WITH DIFFERENTIAL/PLATELET
Basophils Absolute: 0.1 10*3/uL (ref 0.0–0.1)
Basophils Relative: 1 % (ref 0–1)
Eosinophils Absolute: 0.8 10*3/uL — ABNORMAL HIGH (ref 0.0–0.7)
Eosinophils Relative: 7 % — ABNORMAL HIGH (ref 0–5)
HEMATOCRIT: 32.8 % — AB (ref 36.0–46.0)
HEMOGLOBIN: 10.7 g/dL — AB (ref 12.0–15.0)
LYMPHS ABS: 1.7 10*3/uL (ref 0.7–4.0)
LYMPHS PCT: 14 % (ref 12–46)
MCH: 27.5 pg (ref 26.0–34.0)
MCHC: 32.6 g/dL (ref 30.0–36.0)
MCV: 84.3 fL (ref 78.0–100.0)
MONOS PCT: 3 % (ref 3–12)
Monocytes Absolute: 0.4 10*3/uL (ref 0.1–1.0)
NEUTROS ABS: 9.3 10*3/uL — AB (ref 1.7–7.7)
NEUTROS PCT: 75 % (ref 43–77)
Platelets: 330 10*3/uL (ref 150–400)
RBC: 3.89 MIL/uL (ref 3.87–5.11)
RDW: 15.5 % (ref 11.5–15.5)
WBC: 12.4 10*3/uL — AB (ref 4.0–10.5)

## 2014-10-11 MED ORDER — ONDANSETRON HCL 4 MG/2ML IJ SOLN
4.0000 mg | Freq: Once | INTRAMUSCULAR | Status: AC
  Administered 2014-10-12: 4 mg via INTRAVENOUS
  Filled 2014-10-11: qty 2

## 2014-10-11 MED ORDER — SODIUM CHLORIDE 0.9 % IV BOLUS (SEPSIS)
1000.0000 mL | Freq: Once | INTRAVENOUS | Status: AC
  Administered 2014-10-12: 1000 mL via INTRAVENOUS

## 2014-10-11 MED ORDER — MORPHINE SULFATE (PF) 4 MG/ML IV SOLN
4.0000 mg | Freq: Once | INTRAVENOUS | Status: AC
  Administered 2014-10-12: 4 mg via INTRAVENOUS
  Filled 2014-10-11: qty 1
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
## 2014-10-11 NOTE — ED Notes (Signed)
Dr. Horton at the bedside.  

## 2014-10-11 NOTE — ED Notes (Signed)
Pt transported to xray 
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
## 2014-10-12 ENCOUNTER — Emergency Department (HOSPITAL_COMMUNITY): Payer: Medicare Other

## 2014-10-12 DIAGNOSIS — S52571A Other intraarticular fracture of lower end of right radius, initial encounter for closed fracture: Secondary | ICD-10-CM | POA: Diagnosis not present

## 2014-10-12 LAB — COMPREHENSIVE METABOLIC PANEL
ALT: 17 U/L (ref 14–54)
ANION GAP: 11 (ref 5–15)
AST: 20 U/L (ref 15–41)
Albumin: 3.3 g/dL — ABNORMAL LOW (ref 3.5–5.0)
Alkaline Phosphatase: 61 U/L (ref 38–126)
BUN: 33 mg/dL — ABNORMAL HIGH (ref 6–20)
CHLORIDE: 106 mmol/L (ref 101–111)
CO2: 20 mmol/L — AB (ref 22–32)
Calcium: 9.8 mg/dL (ref 8.9–10.3)
Creatinine, Ser: 1.91 mg/dL — ABNORMAL HIGH (ref 0.44–1.00)
GFR, EST AFRICAN AMERICAN: 34 mL/min — AB (ref >60)
GFR, EST NON AFRICAN AMERICAN: 30 mL/min — AB (ref >60)
Glucose, Bld: 194 mg/dL — ABNORMAL HIGH (ref 65–99)
POTASSIUM: 4.2 mmol/L (ref 3.5–5.1)
SODIUM: 137 mmol/L (ref 135–145)
Total Bilirubin: 0.4 mg/dL (ref 0.3–1.2)
Total Protein: 6.8 g/dL (ref 6.5–8.1)

## 2014-10-12 MED ORDER — OXYCODONE-ACETAMINOPHEN 5-325 MG PO TABS
1.0000 | ORAL_TABLET | Freq: Once | ORAL | Status: AC
  Administered 2014-10-12: 1 via ORAL
  Filled 2014-10-12: qty 1

## 2014-10-12 NOTE — ED Notes (Addendum)
Given water to drink and then informed would ambulate in the hallway. Pt verbalized understanding.
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
## 2014-10-12 NOTE — ED Notes (Signed)
Ortho tech at the bedside.  

## 2014-10-12 NOTE — ED Notes (Signed)
Called ortho.

## 2014-10-17 ENCOUNTER — Encounter: Payer: Self-pay | Admitting: Family Medicine

## 2014-10-17 ENCOUNTER — Ambulatory Visit (INDEPENDENT_AMBULATORY_CARE_PROVIDER_SITE_OTHER): Payer: Medicare Other | Admitting: Family Medicine

## 2014-10-17 VITALS — BP 142/82 | HR 80 | Temp 98.0°F | Resp 16 | Wt 168.2 lb

## 2014-10-17 DIAGNOSIS — S3992XA Unspecified injury of lower back, initial encounter: Secondary | ICD-10-CM | POA: Diagnosis not present

## 2014-10-17 DIAGNOSIS — E282 Polycystic ovarian syndrome: Secondary | ICD-10-CM

## 2014-10-17 DIAGNOSIS — M546 Pain in thoracic spine: Secondary | ICD-10-CM | POA: Diagnosis not present

## 2014-10-17 DIAGNOSIS — S52501A Unspecified fracture of the lower end of right radius, initial encounter for closed fracture: Secondary | ICD-10-CM | POA: Diagnosis not present

## 2014-10-17 DIAGNOSIS — M549 Dorsalgia, unspecified: Secondary | ICD-10-CM | POA: Insufficient documentation

## 2014-10-17 MED ORDER — PREDNISONE 10 MG PO TABS: ORAL_TABLET | ORAL | Status: DC

## 2014-10-17 MED ORDER — TIZANIDINE HCL 4 MG PO TABS: 4.0000 mg | ORAL_TABLET | Freq: Four times a day (QID) | ORAL | Status: DC | PRN
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
## 2014-10-17 NOTE — Assessment & Plan Note (Signed)
New to provider, ongoing for pt.  She is following w/ Dr Avanell Shackleton.  Pt has 2.5 cm R ovarian cyst noted on pelvic CT.  Pt to notify GYN.  Pt expressed understanding and is in agreement w/ plan.

## 2014-10-17 NOTE — Progress Notes (Signed)
Pre visit review using our clinic review tool, if applicable. No additional management support is needed unless otherwise documented below in the visit note. 
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
## 2014-10-17 NOTE — Patient Instructions (Signed)
Follow up as scheduled on 10/4 START the prednisone as directed- take w/ food Use the Zanaflex as needed for pain/spasm Use your pain meds as needed Get a donut cushion to help w/ your tailbone pain Alternate heat/ice for pain relief Ask Dr Grandville Silos about the bone island in your pelvis and whether additional work up is required Notify Dr Carren Rang about the R ovarian cyst Call with any questions or concerns Hang in there!!!

## 2014-10-17 NOTE — Assessment & Plan Note (Signed)
New.  Pt has ortho appt tomorrow.  Encouraged donut cushion for sitting.  Start Pred taper.  She has pain meds available.  Will follow.

## 2014-10-29 ENCOUNTER — Other Ambulatory Visit: Payer: Self-pay | Admitting: Orthopedic Surgery

## 2014-10-29 DIAGNOSIS — M25531 Pain in right wrist: Secondary | ICD-10-CM

## 2014-10-30 ENCOUNTER — Ambulatory Visit
Admission: RE | Admit: 2014-10-30 | Discharge: 2014-10-30 | Disposition: A | Discharge status: Home / Self Care | Payer: Medicare Other | Source: Ambulatory Visit | Attending: Orthopedic Surgery | Admitting: Orthopedic Surgery

## 2014-10-30 DIAGNOSIS — M25531 Pain in right wrist: Secondary | ICD-10-CM

## 2014-11-06 ENCOUNTER — Ambulatory Visit (INDEPENDENT_AMBULATORY_CARE_PROVIDER_SITE_OTHER): Payer: Medicare Other | Admitting: Family Medicine

## 2014-11-06 ENCOUNTER — Encounter: Payer: Self-pay | Admitting: Family Medicine

## 2014-11-06 VITALS — BP 126/84 | HR 76 | Temp 98.1°F | Resp 16 | Ht 60.0 in | Wt 167.4 lb

## 2014-11-06 DIAGNOSIS — E785 Hyperlipidemia, unspecified: Secondary | ICD-10-CM

## 2014-11-06 DIAGNOSIS — I10 Essential (primary) hypertension: Secondary | ICD-10-CM

## 2014-11-06 DIAGNOSIS — E038 Other specified hypothyroidism: Secondary | ICD-10-CM

## 2014-11-06 DIAGNOSIS — E039 Hypothyroidism, unspecified: Secondary | ICD-10-CM | POA: Insufficient documentation

## 2014-11-06 LAB — CBC WITH DIFFERENTIAL/PLATELET
BASOS PCT: 0.3 % (ref 0.0–3.0)
Basophils Absolute: 0 10*3/uL (ref 0.0–0.1)
EOS ABS: 0 10*3/uL (ref 0.0–0.7)
EOS PCT: 0.4 % (ref 0.0–5.0)
HEMATOCRIT: 36.7 % (ref 36.0–46.0)
HEMOGLOBIN: 11.9 g/dL — AB (ref 12.0–15.0)
LYMPHS PCT: 11.7 % — AB (ref 12.0–46.0)
Lymphs Abs: 1.2 10*3/uL (ref 0.7–4.0)
MCHC: 32.3 g/dL (ref 30.0–36.0)
MCV: 84.9 fl (ref 78.0–100.0)
MONOS PCT: 1.9 % — AB (ref 3.0–12.0)
Monocytes Absolute: 0.2 10*3/uL (ref 0.1–1.0)
NEUTROS ABS: 9 10*3/uL — AB (ref 1.4–7.7)
Neutrophils Relative %: 85.7 % — ABNORMAL HIGH (ref 43.0–77.0)
Platelets: 386 10*3/uL (ref 150.0–400.0)
RBC: 4.33 Mil/uL (ref 3.87–5.11)
RDW: 16.4 % — AB (ref 11.5–15.5)
WBC: 10.6 10*3/uL — AB (ref 4.0–10.5)

## 2014-11-06 LAB — LIPID PANEL
CHOL/HDL RATIO: 5
Cholesterol: 300 mg/dL — ABNORMAL HIGH (ref 0–200)
HDL: 54.6 mg/dL (ref >39.00)
NONHDL: 245.08
TRIGLYCERIDES: 296 mg/dL — AB (ref 0.0–149.0)
VLDL: 59.2 mg/dL — ABNORMAL HIGH (ref 0.0–40.0)

## 2014-11-06 LAB — HEPATIC FUNCTION PANEL
ALK PHOS: 65 U/L (ref 39–117)
ALT: 10 U/L (ref 0–35)
AST: 9 U/L (ref 0–37)
Albumin: 3.9 g/dL (ref 3.5–5.2)
BILIRUBIN DIRECT: 0.1 mg/dL (ref 0.0–0.3)
BILIRUBIN TOTAL: 0.3 mg/dL (ref 0.2–1.2)
TOTAL PROTEIN: 7.3 g/dL (ref 6.0–8.3)

## 2014-11-06 LAB — BASIC METABOLIC PANEL
BUN: 33 mg/dL — ABNORMAL HIGH (ref 6–23)
CALCIUM: 10.2 mg/dL (ref 8.4–10.5)
CO2: 27 mEq/L (ref 19–32)
CREATININE: 1.28 mg/dL — AB (ref 0.40–1.20)
Chloride: 105 mEq/L (ref 96–112)
GFR: 46.83 mL/min — AB (ref >60.00)
Glucose, Bld: 151 mg/dL — ABNORMAL HIGH (ref 70–99)
Potassium: 5.1 mEq/L (ref 3.5–5.1)
SODIUM: 141 meq/L (ref 135–145)

## 2014-11-06 LAB — LDL CHOLESTEROL, DIRECT: Direct LDL: 198 mg/dL

## 2014-11-06 LAB — TSH: TSH: 1.98 u[IU]/mL (ref 0.35–4.50)

## 2014-11-06 NOTE — Patient Instructions (Signed)
Schedule your complete physical in 6 months We'll notify you of your lab results and make any changes if needed Keep up the good work!  You look great! Call with any questions or concerns If you want to join [place] at the new [place] office, any scheduled appointments will automatically transfer and we will see you at [address] [place] [address] [place], [place] [address] Happy Fall!!!

## 2014-11-06 NOTE — Assessment & Plan Note (Signed)
New dx at last visit.  Pt never started levothyroxine as directed.  Check labs.  Start meds as needed.   Pt expressed understanding and is in agreement w/ plan.

## 2014-11-06 NOTE — Assessment & Plan Note (Signed)
Chronic problem.  Pt never started Lipitor as directed and reports fenofibrate is too expensive.  Check labs.  Adjust medication regimen as needed

## 2014-11-06 NOTE — Progress Notes (Signed)
Pre visit review using our clinic review tool, if applicable. No additional management support is needed unless otherwise documented below in the visit note. 

## 2014-11-06 NOTE — Progress Notes (Signed)
   Subjective:    Patient ID: Marissa Scott, female    DOB: 01-21-1966, 49 y.o.   MRN: [id number]  HPI HTN- chronic problem, currently on Coreg.  Not on Enalapril per Dr Lorrene Reid due to dizziness.  No CP, SOB, HAs, visual changes, edema.  Hyperlipidemia- chronic problem, not currently on Fenofibrate due to cost and never started lipitor.  Denies abd pain, N/V  Hypothyroid- dx'd in April but never got medication.  Denies excessive fatigue, changes to skin/hair/nails.   Review of Systems For ROS see HPI     Objective:   Physical Exam  Constitutional: She is oriented to person, place, and time. She appears well-developed and well-nourished. No distress.  HENT:  Head: Normocephalic and atraumatic.  Eyes: Conjunctivae and EOM are normal. Pupils are equal, round, and reactive to light.  Neck: Normal range of motion. Neck supple. No thyromegaly present.  Cardiovascular: Normal rate, regular rhythm, normal heart sounds and intact distal pulses.   No murmur heard. Pulmonary/Chest: Effort normal and breath sounds normal. No respiratory distress.  Abdominal: Soft. She exhibits no distension. There is no tenderness.  Musculoskeletal: She exhibits no edema.  Lymphadenopathy:    She has no cervical adenopathy.  Neurological: She is alert and oriented to person, place, and time.  Skin: Skin is warm and dry.  Psychiatric: She has a normal mood and affect. Her behavior is normal.  Vitals reviewed.         Assessment & Plan:

## 2014-11-06 NOTE — Assessment & Plan Note (Signed)
Chronic problem.  Currently well controlled on Coreg.  Holding ACE at request of Dr Lorrene Reid (nephrology).  Asymptomatic.  Check labs.  No anticipated med changes.

## 2014-11-07 ENCOUNTER — Other Ambulatory Visit: Payer: Self-pay | Admitting: General Practice

## 2014-11-07 DIAGNOSIS — E785 Hyperlipidemia, unspecified: Secondary | ICD-10-CM

## 2014-11-07 MED ORDER — ROSUVASTATIN CALCIUM 20 MG PO TABS: 20.0000 mg | ORAL_TABLET | Freq: Every day | ORAL | Status: DC

## 2014-12-04 ENCOUNTER — Encounter: Payer: Self-pay | Admitting: Family Medicine

## 2014-12-19 ENCOUNTER — Encounter: Payer: Self-pay | Admitting: Endocrinology

## 2014-12-19 ENCOUNTER — Ambulatory Visit (INDEPENDENT_AMBULATORY_CARE_PROVIDER_SITE_OTHER): Payer: Medicare Other | Admitting: Endocrinology

## 2014-12-19 VITALS — BP 127/84 | HR 83 | Temp 98.1°F | Ht 61.0 in | Wt 170.0 lb

## 2014-12-19 DIAGNOSIS — E1122 Type 2 diabetes mellitus with diabetic chronic kidney disease: Secondary | ICD-10-CM | POA: Diagnosis not present

## 2014-12-19 LAB — POCT GLYCOSYLATED HEMOGLOBIN (HGB A1C): Hemoglobin A1C: 6.8

## 2014-12-19 NOTE — Patient Instructions (Signed)
Please continue the same medication.  Please come back for a follow-up appointment in 3-6 months.

## 2014-12-19 NOTE — Progress Notes (Signed)
Subjective:    Patient ID: Marissa Scott, female    DOB: 1966/01/07, 49 y.o.   MRN: [id number]  HPI Pt returns for f/u of diabetes mellitus: DM type: 2 Dx'ed: [id number] Complications: renal insufficiency. Therapy: pioglitizone GDM: never.  DKA: never Severe hypoglycemia: never Pancreatitis: never Other: she has never been on insulin; she cannot take metformin due to renal insufficiency; she says she can afford name-brand meds only on a limited basis; she cannot afford cycloset.  She did not tolerate acarbose.  Interval history: she hasn't recently checked cbg.  She continues to be off and on steroids for low-back pain.  Past Medical History  Diagnosis Date  . RENAL DISEASE, CHRONIC, STAGE III 01/30/2008    Follows w/ Renal  . Polycystic ovaries 01/04/2009  . HYPERURICEMIA 10/01/2008  . HYPERTENSION 01/30/2008  . HYPERLIPIDEMIA 01/30/2008  . DIABETES MELLITUS, TYPE II 01/30/2008  . Arthritis     Past Surgical History  Procedure Laterality Date  . Lumbar laminectomy      L4-L5  . Nephrectomy      left removed, partial right-Ottelin  . Appendectomy    . Tubal ligation      GYN [facility]  . Right hand cyst    . Neural stimulator      Social History   Social History  . Marital Status: Married    Spouse Name: N/A  . Number of Children: N/A  . Years of Education: N/A   Occupational History  . Disabled    Social History Main Topics  . Smoking status: Never Smoker   . Smokeless tobacco: Never Used  . Alcohol Use: Yes     Comment: rarely  . Drug Use: No  . Sexual Activity: Not on file   Other Topics Concern  . Not on file   Social History Narrative   Disabled for back pain    Current Outpatient Prescriptions on File Prior to Visit  Medication Sig Dispense Refill  . carvedilol (COREG) 12.5 MG tablet TAKE ONE TABLET BY MOUTH TWICE DAILY WITH MEALS 60 tablet 6  . folic acid (FOLVITE) 1 MG tablet Take 2 mg by mouth daily.     Marland Kitchen HYDROmorphone (DILAUDID) 2 MG tablet  Take 1 tablet by mouth every 6 (six) hours as needed for moderate pain.     . methotrexate (RHEUMATREX) 2.5 MG tablet Take 15 mg by mouth once a week. On Saturday    . Multiple Vitamin (MULTI-VITAMINS) TABS Take 1 tablet by mouth daily.    Marland Kitchen oxyCODONE-acetaminophen (PERCOCET) 10-325 MG per tablet Take 1 tablet by mouth every 6 (six) hours as needed.    . pioglitazone (ACTOS) 30 MG tablet Take 1 tablet (30 mg total) by mouth daily. 30 tablet 11  . rosuvastatin (CRESTOR) 20 MG tablet Take 1 tablet (20 mg total) by mouth daily. (Patient not taking: Reported on 12/19/2014) 30 tablet 6   No current facility-administered medications on file prior to visit.    Allergies  Allergen Reactions  . Pioglitazone     arthralgias  . Penicillins Rash    Family History  Problem Relation Age of Onset  . Hypertension Father   . Heart disease Father     CAD  . Diabetes Father   . Stroke Father   . Cancer Father     Colon Cancer  . Colon cancer Father 55  . Cancer Cousin 42    Lynch Syndrome  . Esophageal cancer Neg Hx   . Stomach cancer Neg  Hx   . Rectal cancer Neg Hx   .       BP 127/84 mmHg  Pulse 83  Temp(Src) 98.1 F (36.7 C) (Oral)  Ht 5\' 1"  (1.549 m)  Wt 170 lb (77.111 kg)  BMI 32.14 kg/m2  SpO2 96%  Review of Systems She has weight gain    Objective:   Physical Exam VITAL SIGNS:  See vs page GENERAL: no distress Pulses: dorsalis pedis intact bilat.   MSK: no deformity of the feet CV: no leg edema. Skin:  no ulcer on the feet.  normal color and temp on the feet. Neuro: sensation is intact to touch on the feet.     A1c=6.8%    Assessment & Plan:  DM: well-controlled.   Patient is advised the following: Patient Instructions  Please continue the same medication.  Please come back for a follow-up appointment in 3-6 months.

## 2015-01-07 ENCOUNTER — Encounter: Payer: Self-pay | Admitting: Family Medicine

## 2015-01-25 ENCOUNTER — Encounter: Payer: Self-pay | Admitting: Family Medicine

## 2015-01-25 ENCOUNTER — Ambulatory Visit (INDEPENDENT_AMBULATORY_CARE_PROVIDER_SITE_OTHER): Payer: Medicare Other | Admitting: Family Medicine

## 2015-01-25 VITALS — BP 130/78 | HR 76 | Temp 97.9°F | Resp 16 | Ht 61.0 in | Wt 172.0 lb

## 2015-01-25 DIAGNOSIS — S3992XD Unspecified injury of lower back, subsequent encounter: Secondary | ICD-10-CM
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
## 2015-01-25 NOTE — Patient Instructions (Signed)
Follow up as scheduled Follow up w/ Dr Carloyn Manner for the back and sacral/coccyx pain Call with any questions or concerns If you want to join [place] at the new [place] office, any scheduled appointments will automatically transfer and we will see you at [address] [place] [address] [place], [place], [place] [address] ([place]) [place] in there! Happy Holidays!!

## 2015-01-25 NOTE — Assessment & Plan Note (Signed)
Ongoing pain for pt.  Unable to sit on R buttock w/o pain.  Pt's CT report was addended to include abnormality to coccyx.  Pt has f/u w/ Dr Carloyn Manner.  Copy of report was printed for pt to submit to insurance.  Will follow.

## 2015-01-25 NOTE — Progress Notes (Signed)
Pre visit review using our clinic review tool, if applicable. No additional management support is needed unless otherwise documented below in the visit note. 

## 2015-02-11 ENCOUNTER — Other Ambulatory Visit: Payer: Self-pay

## 2015-02-11 DIAGNOSIS — Z1231 Encounter for screening mammogram for malignant neoplasm of breast: Secondary | ICD-10-CM

## 2015-02-27 ENCOUNTER — Ambulatory Visit: Payer: Medicare Other

## 2015-03-06 ENCOUNTER — Ambulatory Visit
Admission: RE | Admit: 2015-03-06 | Discharge: 2015-03-06 | Disposition: A | Discharge status: Home / Self Care | Payer: Commercial Managed Care - HMO | Source: Ambulatory Visit

## 2015-03-06 DIAGNOSIS — Z1231 Encounter for screening mammogram for malignant neoplasm of breast: Secondary | ICD-10-CM

## 2015-03-27 ENCOUNTER — Encounter: Payer: Self-pay | Admitting: General Practice

## 2015-03-27 ENCOUNTER — Encounter: Payer: Self-pay | Admitting: Family Medicine

## 2015-03-27 DIAGNOSIS — Z9889 Other specified postprocedural states: Secondary | ICD-10-CM

## 2015-03-27 DIAGNOSIS — N183 Chronic kidney disease, stage 3 unspecified: Secondary | ICD-10-CM

## 2015-03-27 DIAGNOSIS — E1122 Type 2 diabetes mellitus with diabetic chronic kidney disease: Secondary | ICD-10-CM

## 2015-03-27 DIAGNOSIS — L4050 Arthropathic psoriasis, unspecified: Secondary | ICD-10-CM

## 2015-03-27 NOTE — Telephone Encounter (Signed)
Referral placed to endo and nephrology. Waiting on pt to advise on diagnosis for neuro referral.

## 2015-03-28 NOTE — Telephone Encounter (Signed)
Placed these referrals today.

## 2015-04-18 DIAGNOSIS — R21 Rash and other nonspecific skin eruption: Secondary | ICD-10-CM | POA: Diagnosis not present

## 2015-04-18 DIAGNOSIS — L4050 Arthropathic psoriasis, unspecified: Secondary | ICD-10-CM | POA: Diagnosis not present

## 2015-04-18 DIAGNOSIS — N183 Chronic kidney disease, stage 3 (moderate): Secondary | ICD-10-CM | POA: Diagnosis not present

## 2015-04-18 DIAGNOSIS — E119 Type 2 diabetes mellitus without complications: Secondary | ICD-10-CM | POA: Diagnosis not present

## 2015-04-18 DIAGNOSIS — I10 Essential (primary) hypertension: Secondary | ICD-10-CM | POA: Diagnosis not present

## 2015-04-18 DIAGNOSIS — R809 Proteinuria, unspecified: Secondary | ICD-10-CM | POA: Diagnosis not present

## 2015-04-23 DIAGNOSIS — M47816 Spondylosis without myelopathy or radiculopathy, lumbar region: Secondary | ICD-10-CM | POA: Diagnosis not present

## 2015-04-23 DIAGNOSIS — M47812 Spondylosis without myelopathy or radiculopathy, cervical region: Secondary | ICD-10-CM | POA: Diagnosis not present

## 2015-05-03 ENCOUNTER — Other Ambulatory Visit: Payer: Self-pay | Admitting: Neurosurgery

## 2015-05-03 DIAGNOSIS — M47812 Spondylosis without myelopathy or radiculopathy, cervical region: Secondary | ICD-10-CM

## 2015-05-03 DIAGNOSIS — S322XXA Fracture of coccyx, initial encounter for closed fracture: Secondary | ICD-10-CM

## 2015-05-07 ENCOUNTER — Ambulatory Visit
Admission: RE | Admit: 2015-05-07 | Discharge: 2015-05-07 | Disposition: A | Discharge status: Home / Self Care | Payer: Commercial Managed Care - HMO | Source: Ambulatory Visit | Attending: Neurosurgery | Admitting: Neurosurgery

## 2015-05-07 DIAGNOSIS — R102 Pelvic and perineal pain: Secondary | ICD-10-CM | POA: Diagnosis not present

## 2015-05-07 DIAGNOSIS — M47812 Spondylosis without myelopathy or radiculopathy, cervical region: Secondary | ICD-10-CM

## 2015-05-07 DIAGNOSIS — S322XXA Fracture of coccyx, initial encounter for closed fracture: Secondary | ICD-10-CM

## 2015-05-08 DIAGNOSIS — M129 Arthropathy, unspecified: Secondary | ICD-10-CM | POA: Diagnosis not present

## 2015-05-08 DIAGNOSIS — M47816 Spondylosis without myelopathy or radiculopathy, lumbar region: Secondary | ICD-10-CM | POA: Diagnosis not present

## 2015-05-08 DIAGNOSIS — M47812 Spondylosis without myelopathy or radiculopathy, cervical region: Secondary | ICD-10-CM | POA: Diagnosis not present

## 2015-05-08 DIAGNOSIS — S322XXD Fracture of coccyx, subsequent encounter for fracture with routine healing: Secondary | ICD-10-CM | POA: Diagnosis not present

## 2015-05-15 ENCOUNTER — Encounter: Payer: Self-pay | Admitting: Family Medicine

## 2015-05-15 ENCOUNTER — Encounter: Payer: Medicare Other | Admitting: Family Medicine

## 2015-05-20 ENCOUNTER — Encounter: Payer: Self-pay | Admitting: Family Medicine

## 2015-05-23 ENCOUNTER — Ambulatory Visit (INDEPENDENT_AMBULATORY_CARE_PROVIDER_SITE_OTHER): Payer: Commercial Managed Care - HMO | Admitting: Family Medicine

## 2015-05-23 ENCOUNTER — Encounter: Payer: Self-pay | Admitting: Family Medicine

## 2015-05-23 VITALS — BP 132/80 | HR 80 | Temp 98.0°F | Resp 16 | Ht 61.0 in | Wt 173.5 lb

## 2015-05-23 DIAGNOSIS — E1129 Type 2 diabetes mellitus with other diabetic kidney complication: Secondary | ICD-10-CM | POA: Diagnosis not present

## 2015-05-23 DIAGNOSIS — Z0000 Encounter for general adult medical examination without abnormal findings: Secondary | ICD-10-CM | POA: Diagnosis not present

## 2015-05-23 DIAGNOSIS — I10 Essential (primary) hypertension: Secondary | ICD-10-CM | POA: Diagnosis not present

## 2015-05-23 DIAGNOSIS — E1122 Type 2 diabetes mellitus with diabetic chronic kidney disease: Secondary | ICD-10-CM | POA: Diagnosis not present

## 2015-05-23 DIAGNOSIS — E038 Other specified hypothyroidism: Secondary | ICD-10-CM

## 2015-05-23 DIAGNOSIS — M2550 Pain in unspecified joint: Secondary | ICD-10-CM | POA: Diagnosis not present

## 2015-05-23 DIAGNOSIS — E785 Hyperlipidemia, unspecified: Secondary | ICD-10-CM | POA: Diagnosis not present

## 2015-05-23 DIAGNOSIS — N189 Chronic kidney disease, unspecified: Secondary | ICD-10-CM | POA: Diagnosis not present

## 2015-05-23 LAB — HEPATIC FUNCTION PANEL
ALBUMIN: 3.6 g/dL (ref 3.6–5.1)
ALK PHOS: 77 U/L (ref 33–130)
ALT: 9 U/L (ref 6–29)
AST: 12 U/L (ref 10–35)
BILIRUBIN TOTAL: 0.4 mg/dL (ref 0.2–1.2)
Total Protein: 7.1 g/dL (ref 6.1–8.1)

## 2015-05-23 LAB — CBC WITH DIFFERENTIAL/PLATELET
BASOS ABS: 127 {cells}/uL (ref 0–200)
Basophils Relative: 1 %
EOS ABS: 381 {cells}/uL (ref 15–500)
Eosinophils Relative: 3 %
HEMATOCRIT: 33 % — AB (ref 35.0–45.0)
HEMOGLOBIN: 10.5 g/dL — AB (ref 11.7–15.5)
LYMPHS ABS: 2794 {cells}/uL (ref 850–3900)
Lymphocytes Relative: 22 %
MCH: 26.9 pg — AB (ref 27.0–33.0)
MCHC: 31.8 g/dL — AB (ref 32.0–36.0)
MCV: 84.4 fL (ref 80.0–100.0)
MONO ABS: 381 {cells}/uL (ref 200–950)
MPV: 9.2 fL (ref 7.5–12.5)
Monocytes Relative: 3 %
NEUTROS ABS: 9017 {cells}/uL — AB (ref 1500–7800)
NEUTROS PCT: 71 %
Platelets: 432 10*3/uL — ABNORMAL HIGH (ref 140–400)
RBC: 3.91 MIL/uL (ref 3.80–5.10)
RDW: 17.5 % — ABNORMAL HIGH (ref 11.0–15.0)
WBC: 12.7 10*3/uL — ABNORMAL HIGH (ref 3.8–10.8)

## 2015-05-23 LAB — LIPID PANEL
CHOL/HDL RATIO: 6.8 ratio — AB (ref <=5.0)
Cholesterol: 271 mg/dL — ABNORMAL HIGH (ref 125–200)
HDL: 40 mg/dL — ABNORMAL LOW (ref >=46)
Triglycerides: 531 mg/dL — ABNORMAL HIGH (ref <150)

## 2015-05-23 LAB — BASIC METABOLIC PANEL
BUN: 31 mg/dL — AB (ref 7–25)
CHLORIDE: 101 mmol/L (ref 98–110)
CO2: 25 mmol/L (ref 20–31)
Calcium: 10.2 mg/dL (ref 8.6–10.4)
Creat: 1.52 mg/dL — ABNORMAL HIGH (ref 0.50–1.05)
Glucose, Bld: 120 mg/dL — ABNORMAL HIGH (ref 65–99)
POTASSIUM: 5.3 mmol/L (ref 3.5–5.3)
SODIUM: 139 mmol/L (ref 135–146)

## 2015-05-23 LAB — TSH: TSH: 4.71 mIU/L — ABNORMAL HIGH

## 2015-05-23 LAB — HEMOGLOBIN A1C
Hgb A1c MFr Bld: 6.9 % — ABNORMAL HIGH (ref <5.7)
Mean Plasma Glucose: 151 mg/dL

## 2015-05-23 MED ORDER — PREDNISONE 10 MG PO TABS: ORAL_TABLET | ORAL | Status: DC

## 2015-05-23 NOTE — Patient Instructions (Addendum)
Follow up in 6 months to recheck BP and cholesterol We'll notify you of your lab results and make any changes if needed Call and schedule an eye exam at your convenience You are up to date on pap (due 2018), mammo (due 2018), colonoscopy (due 2021) Start the prednisone as directed- take w/ food Call with any questions or concerns Hang in there!!! Happy Early Rudene [place]!!!

## 2015-05-23 NOTE — Progress Notes (Signed)
   Subjective:    Patient ID: Marissa Scott, female    DOB: 07/03/1965, 50 y.o.   MRN: [id number]  HPI Here today for CPE.  Risk Factors: HTN- chronic problem, adequate control on Coreg Hyperlipidemia- chronic problem, on Fenofibrate and Crestor DM- chronic problem, following w/ Dr Loanne Drilling.  On Actos Hypothyroid- chronic problem, not currently on medication.  Pt reports fatigue, hair loss. Polyarthralgia- pt is having severe pain in back.  On pain meds, flexeril, and gabapentin w/ minimal relief.  Has had some results from prednisone in the past. Physical Activity: limited activity due to chronic pain Fall Risk: low risk Depression: denies current sxs Hearing: normal to conversational tones and whispered voice at 6 ft ADL's: independent Cognitive: normal linear thought process, memory and attention intact Home Safety: safe at home, lives w/ husband Height, Weight, BMI, Visual Acuity: see vitals, vision corrected to 20/20 w/ glasses Counseling: UTD on pap smear, colonoscopy, mammo, foot exam.  Due for eye exam, Tdap Care team reviewed and updated w/ pt Labs Ordered: See A&P Care Plan: See A&P    Review of Systems Patient reports no vision/ hearing changes, adenopathy,fever, weight change,  persistant/recurrent hoarseness , swallowing issues, chest pain, palpitations, edema, persistant/recurrent cough, hemoptysis, dyspnea (rest/exertional/paroxysmal nocturnal), gastrointestinal bleeding (melena, rectal bleeding), abdominal pain, significant heartburn, GU symptoms (dysuria, hematuria, incontinence), Gyn symptoms (abnormal  bleeding, pain),  syncope, focal weakness, memory loss, numbness & tingling, skin/nail changes, abnormal bruising or bleeding, anxiety, or depression.   + chills + constipation    Objective:   Physical Exam General Appearance:    Alert, cooperative, no distress, appears stated age  Head:    Normocephalic, without obvious abnormality, atraumatic  Eyes:    PERRL,  conjunctiva/corneas clear, EOM's intact, fundi    benign, both eyes  Ears:    Normal TM's and external ear canals, both ears  Nose:   Nares normal, septum midline, mucosa normal, no drainage    or sinus tenderness  Throat:   Lips, mucosa, and tongue normal; teeth and gums normal  Neck:   Supple, symmetrical, trachea midline, no adenopathy;    Thyroid: no enlargement/tenderness/nodules  Back:     Symmetric, no curvature, ROM normal, no CVA tenderness  Lungs:     Clear to auscultation bilaterally, respirations unlabored  Chest Wall:    No tenderness or deformity   Heart:    Regular rate and rhythm, S1 and S2 normal, no murmur, rub   or gallop  Breast Exam:    Deferred to GYN  Abdomen:     Soft, non-tender, bowel sounds active all four quadrants,    no masses, no organomegaly  Genitalia:    Deferred to GYN  Rectal:    Extremities:   Extremities normal, atraumatic, no cyanosis or edema  Pulses:   2+ and symmetric all extremities  Skin:   Skin color, texture, turgor normal, no rashes or lesions  Lymph nodes:   Cervical, supraclavicular, and axillary nodes normal  Neurologic:   CNII-XII intact, normal strength, sensation and reflexes    throughout          Assessment & Plan:

## 2015-05-23 NOTE — Progress Notes (Signed)
Pre visit review using our clinic review tool, if applicable. No additional management support is needed unless otherwise documented below in the visit note. 

## 2015-05-24 ENCOUNTER — Encounter: Payer: Self-pay | Admitting: Endocrinology

## 2015-05-24 ENCOUNTER — Other Ambulatory Visit: Payer: Self-pay | Admitting: General Practice

## 2015-05-24 DIAGNOSIS — E785 Hyperlipidemia, unspecified: Secondary | ICD-10-CM

## 2015-05-24 DIAGNOSIS — E038 Other specified hypothyroidism: Secondary | ICD-10-CM

## 2015-05-24 MED ORDER — PRAVASTATIN SODIUM 20 MG PO TABS: 20.0000 mg | ORAL_TABLET | Freq: Every day | ORAL | Status: DC

## 2015-05-24 MED ORDER — LEVOTHYROXINE SODIUM 50 MCG PO TABS: 50.0000 ug | ORAL_TABLET | Freq: Every day | ORAL | Status: DC

## 2015-05-24 NOTE — Assessment & Plan Note (Signed)
Chronic problem.  Following w/ Endo.  Due for labs.  Pt to schedule eye exam.

## 2015-05-24 NOTE — Assessment & Plan Note (Signed)
Chronic problem.  Adequate control.  Asymptomatic.  Check labs.  No anticipated med changes 

## 2015-05-24 NOTE — Assessment & Plan Note (Signed)
Pt has hx of this.  Not currently on medication but having hair loss and fatigue.  Check labs.  Restart meds prn.

## 2015-05-24 NOTE — Assessment & Plan Note (Signed)
Pt's PE unchanged from previous.  UTD on pap and mammo.  UTD on colonoscopy.  Written screening schedule updated and given to pt.  Check labs.  Anticipatory guidance provided.

## 2015-05-24 NOTE — Assessment & Plan Note (Signed)
Ongoing issue for pt.  Start low dose prednisone taper.  Pt expressed understanding and is in agreement w/ plan.

## 2015-05-24 NOTE — Assessment & Plan Note (Signed)
Chronic problem.  Pt currently on fenofibrate daily.  Not able to exercise.  Not following particular diet.  Stressed need for both.  Check labs.  Adjust meds prn.

## 2015-05-29 ENCOUNTER — Encounter: Payer: Self-pay | Admitting: Family Medicine

## 2015-06-10 DIAGNOSIS — Z79899 Other long term (current) drug therapy: Secondary | ICD-10-CM | POA: Diagnosis not present

## 2015-06-12 DIAGNOSIS — M47816 Spondylosis without myelopathy or radiculopathy, lumbar region: Secondary | ICD-10-CM | POA: Diagnosis not present

## 2015-06-12 DIAGNOSIS — S322XXD Fracture of coccyx, subsequent encounter for fracture with routine healing: Secondary | ICD-10-CM | POA: Diagnosis not present

## 2015-06-12 DIAGNOSIS — M129 Arthropathy, unspecified: Secondary | ICD-10-CM | POA: Diagnosis not present

## 2015-06-12 DIAGNOSIS — M47812 Spondylosis without myelopathy or radiculopathy, cervical region: Secondary | ICD-10-CM | POA: Diagnosis not present

## 2015-06-18 ENCOUNTER — Ambulatory Visit: Payer: Medicare Other | Admitting: Endocrinology

## 2015-06-26 ENCOUNTER — Other Ambulatory Visit: Payer: Self-pay | Admitting: Family Medicine

## 2015-06-26 NOTE — Telephone Encounter (Signed)
Medication filled to pharmacy as requested.   

## 2015-07-02 ENCOUNTER — Encounter: Payer: Self-pay | Admitting: Family Medicine

## 2015-07-08 ENCOUNTER — Other Ambulatory Visit: Payer: Self-pay | Admitting: Endocrinology

## 2015-07-12 ENCOUNTER — Other Ambulatory Visit (INDEPENDENT_AMBULATORY_CARE_PROVIDER_SITE_OTHER): Payer: Commercial Managed Care - HMO

## 2015-07-12 ENCOUNTER — Telehealth: Payer: Self-pay | Admitting: General Practice

## 2015-07-12 DIAGNOSIS — E785 Hyperlipidemia, unspecified: Secondary | ICD-10-CM

## 2015-07-12 DIAGNOSIS — E038 Other specified hypothyroidism: Secondary | ICD-10-CM | POA: Diagnosis not present

## 2015-07-12 LAB — HEPATIC FUNCTION PANEL
ALK PHOS: 29 U/L — AB (ref 39–117)
ALT: 11 U/L (ref 0–35)
AST: 12 U/L (ref 0–37)
Albumin: 3.8 g/dL (ref 3.5–5.2)
BILIRUBIN DIRECT: 0.1 mg/dL (ref 0.0–0.3)
BILIRUBIN TOTAL: 0.4 mg/dL (ref 0.2–1.2)
Total Protein: 6.5 g/dL (ref 6.0–8.3)

## 2015-07-12 LAB — TSH: TSH: 3.39 u[IU]/mL (ref 0.35–4.50)

## 2015-07-12 MED ORDER — PREDNISONE 10 MG PO TABS: ORAL_TABLET | ORAL | Status: DC

## 2015-07-12 NOTE — Telephone Encounter (Signed)
Pt informed, stated an understanding. Med filled to [facility].

## 2015-07-12 NOTE — Telephone Encounter (Signed)
Pt was in office today for a blood draw and asked the phlebotomist if PCP could call in prednisone for her since she is having severe foot pain.

## 2015-07-12 NOTE — Telephone Encounter (Signed)
Is this similar to the gout that she has hx of or something else?  Since pt is not able to take NSAIDs due to kidney issues, ok for Prednisone taper 10mg , 3x 3 days, 2x 3 days, 1 x3 days, #18.  If no improvement, will need OV

## 2015-07-19 DIAGNOSIS — R809 Proteinuria, unspecified: Secondary | ICD-10-CM | POA: Diagnosis not present

## 2015-07-19 DIAGNOSIS — E119 Type 2 diabetes mellitus without complications: Secondary | ICD-10-CM | POA: Diagnosis not present

## 2015-07-19 DIAGNOSIS — E669 Obesity, unspecified: Secondary | ICD-10-CM | POA: Diagnosis not present

## 2015-07-19 DIAGNOSIS — N183 Chronic kidney disease, stage 3 (moderate): Secondary | ICD-10-CM | POA: Diagnosis not present

## 2015-07-19 DIAGNOSIS — L270 Generalized skin eruption due to drugs and medicaments taken internally: Secondary | ICD-10-CM | POA: Diagnosis not present

## 2015-07-19 DIAGNOSIS — L4050 Arthropathic psoriasis, unspecified: Secondary | ICD-10-CM | POA: Diagnosis not present

## 2015-07-19 DIAGNOSIS — I10 Essential (primary) hypertension: Secondary | ICD-10-CM | POA: Diagnosis not present

## 2015-07-29 ENCOUNTER — Encounter: Payer: Self-pay | Admitting: Family Medicine

## 2015-07-30 ENCOUNTER — Other Ambulatory Visit: Payer: Self-pay | Admitting: Endocrinology

## 2015-07-30 ENCOUNTER — Ambulatory Visit (INDEPENDENT_AMBULATORY_CARE_PROVIDER_SITE_OTHER): Payer: Medicare HMO | Admitting: Family Medicine

## 2015-07-30 ENCOUNTER — Encounter: Payer: Self-pay | Admitting: Family Medicine

## 2015-07-30 VITALS — BP 126/82 | HR 73 | Temp 97.9°F | Resp 16 | Ht 61.0 in | Wt 175.4 lb

## 2015-07-30 DIAGNOSIS — R21 Rash and other nonspecific skin eruption: Secondary | ICD-10-CM | POA: Diagnosis not present

## 2015-07-30 DIAGNOSIS — D509 Iron deficiency anemia, unspecified: Secondary | ICD-10-CM | POA: Diagnosis not present

## 2015-07-30 DIAGNOSIS — D485 Neoplasm of uncertain behavior of skin: Secondary | ICD-10-CM | POA: Diagnosis not present

## 2015-07-30 DIAGNOSIS — D225 Melanocytic nevi of trunk: Secondary | ICD-10-CM | POA: Diagnosis not present

## 2015-07-30 LAB — CBC WITH DIFFERENTIAL/PLATELET
BASOS PCT: 0.3 % (ref 0.0–3.0)
Basophils Absolute: 0 10*3/uL (ref 0.0–0.1)
EOS ABS: 0.1 10*3/uL (ref 0.0–0.7)
EOS PCT: 1.2 % (ref 0.0–5.0)
HEMATOCRIT: 29.5 % — AB (ref 36.0–46.0)
Hemoglobin: 9.6 g/dL — ABNORMAL LOW (ref 12.0–15.0)
Lymphocytes Relative: 22 % (ref 12.0–46.0)
Lymphs Abs: 1.6 10*3/uL (ref 0.7–4.0)
MCHC: 32.5 g/dL (ref 30.0–36.0)
MCV: 85.8 fl (ref 78.0–100.0)
MONO ABS: 0.1 10*3/uL (ref 0.1–1.0)
Monocytes Relative: 1.5 % — ABNORMAL LOW (ref 3.0–12.0)
NEUTROS ABS: 5.5 10*3/uL (ref 1.4–7.7)
Neutrophils Relative %: 75 % (ref 43.0–77.0)
PLATELETS: 292 10*3/uL (ref 150.0–400.0)
RBC: 3.44 Mil/uL — ABNORMAL LOW (ref 3.87–5.11)
WBC: 7.4 10*3/uL (ref 4.0–10.5)

## 2015-07-30 LAB — IBC PANEL
IRON: 181 ug/dL — AB (ref 42–145)
Saturation Ratios: 38.7 % (ref 20.0–50.0)
TRANSFERRIN: 334 mg/dL (ref 212.0–360.0)

## 2015-07-30 NOTE — Patient Instructions (Signed)
You have an appt w/ Dr Melina Copa at [name] this afternoon at [facility]  [address]  [place], [place] [address]  [phone] Call with any questions or concerns [place]bert Ew[place] in there!!!

## 2015-07-30 NOTE — Assessment & Plan Note (Signed)
New.  Severe.  Pt had no improvement w/ prednisone and sxs are worsening.  Unclear as to cause- sun exposure vs med reaction vs autoimmune process.  Called multiple dermatology offices to attempt to get her an appt today.  Her current derm no longer takes her insurance.  After numerous calls, was able to get her an appt at [name] later today.  Will hold on additional prednisone until after she is evaluated by derm.  Will follow.

## 2015-07-30 NOTE — Progress Notes (Signed)
Pre visit review using our clinic review tool, if applicable. No additional management support is needed unless otherwise documented below in the visit note. 

## 2015-07-30 NOTE — Progress Notes (Signed)
   Subjective:    Patient ID: Mar[place], female    DOB: August 20, 1965, 50 y.o.   MRN: [id number]  HPI Rash- pt developed severe rash 8 days ago.  First appeared on legs.  Very itchy.  Pt reported she had poison ivy and asked for prednisone- she completed 10 day course.  Pt had redness on chest throughout May with a blistering rash- this corresponded w/ starting Levothyroxine.  Pt now w/ linear distribution of rash down anterolateral L leg and anterior R thigh, arms bilaterally.  Has not seen derm.  Only mild relief w/ prednisone that she took for itching and redness on chest prior to this outbreak.  No fevers.  No new medications.   Review of Systems For ROS see HPI     Objective:   Physical Exam  Constitutional: She is oriented to person, place, and time. She appears well-developed and well-nourished. No distress.  HENT:  Head: Normocephalic and atraumatic.  Neurological: She is alert and oriented to person, place, and time.  Skin: Skin is warm and dry. Rash noted. There is erythema.  Beefy red maculopapular rash that blanches in band-like distribution down anterolateral L leg from hip to foot. On R anterior thigh, in band like pattern down both arms and on chest.  Very itchy- pt scratching throughout visit.    Psychiatric: She has a normal mood and affect. Her behavior is normal. Thought content normal.  Vitals reviewed.         Assessment & Plan:

## 2015-08-02 ENCOUNTER — Other Ambulatory Visit (HOSPITAL_COMMUNITY): Payer: Self-pay | Admitting: *Deleted

## 2015-08-05 ENCOUNTER — Ambulatory Visit (HOSPITAL_COMMUNITY): Payer: Medicare HMO

## 2015-08-05 DIAGNOSIS — L989 Disorder of the skin and subcutaneous tissue, unspecified: Secondary | ICD-10-CM | POA: Diagnosis not present

## 2015-08-09 ENCOUNTER — Encounter: Payer: Self-pay | Admitting: Family Medicine

## 2015-08-14 ENCOUNTER — Encounter (HOSPITAL_COMMUNITY): Payer: Medicare HMO

## 2015-08-14 DIAGNOSIS — R21 Rash and other nonspecific skin eruption: Secondary | ICD-10-CM | POA: Diagnosis not present

## 2015-08-23 DIAGNOSIS — E875 Hyperkalemia: Secondary | ICD-10-CM | POA: Diagnosis not present

## 2015-09-03 DIAGNOSIS — L931 Subacute cutaneous lupus erythematosus: Secondary | ICD-10-CM | POA: Diagnosis not present

## 2015-09-04 ENCOUNTER — Other Ambulatory Visit: Payer: Self-pay | Admitting: Endocrinology

## 2015-09-04 NOTE — Telephone Encounter (Signed)
Medication filled to pharmacy as requested.   

## 2015-09-12 DIAGNOSIS — M47816 Spondylosis without myelopathy or radiculopathy, lumbar region: Secondary | ICD-10-CM | POA: Diagnosis not present

## 2015-09-12 DIAGNOSIS — M129 Arthropathy, unspecified: Secondary | ICD-10-CM | POA: Diagnosis not present

## 2015-09-12 DIAGNOSIS — M47812 Spondylosis without myelopathy or radiculopathy, cervical region: Secondary | ICD-10-CM | POA: Diagnosis not present

## 2015-09-12 DIAGNOSIS — S322XXD Fracture of coccyx, subsequent encounter for fracture with routine healing: Secondary | ICD-10-CM | POA: Diagnosis not present

## 2015-09-23 DIAGNOSIS — Z79899 Other long term (current) drug therapy: Secondary | ICD-10-CM | POA: Diagnosis not present

## 2015-09-23 DIAGNOSIS — R21 Rash and other nonspecific skin eruption: Secondary | ICD-10-CM | POA: Diagnosis not present

## 2015-10-14 DIAGNOSIS — Z79899 Other long term (current) drug therapy: Secondary | ICD-10-CM | POA: Diagnosis not present

## 2015-10-28 ENCOUNTER — Encounter: Payer: Self-pay | Admitting: Family Medicine

## 2015-10-28 ENCOUNTER — Other Ambulatory Visit: Payer: Self-pay | Admitting: Family Medicine

## 2015-10-28 DIAGNOSIS — N183 Chronic kidney disease, stage 3 unspecified: Secondary | ICD-10-CM

## 2015-10-28 DIAGNOSIS — M47812 Spondylosis without myelopathy or radiculopathy, cervical region: Secondary | ICD-10-CM

## 2015-10-28 DIAGNOSIS — M2550 Pain in unspecified joint: Secondary | ICD-10-CM

## 2015-11-05 DIAGNOSIS — Z79899 Other long term (current) drug therapy: Secondary | ICD-10-CM | POA: Diagnosis not present

## 2015-11-14 ENCOUNTER — Encounter: Payer: Self-pay | Admitting: Family Medicine

## 2015-11-14 DIAGNOSIS — E282 Polycystic ovarian syndrome: Secondary | ICD-10-CM

## 2015-11-14 DIAGNOSIS — Z01419 Encounter for gynecological examination (general) (routine) without abnormal findings: Secondary | ICD-10-CM

## 2015-11-21 ENCOUNTER — Encounter: Payer: Self-pay | Admitting: Family Medicine

## 2015-11-21 ENCOUNTER — Ambulatory Visit (INDEPENDENT_AMBULATORY_CARE_PROVIDER_SITE_OTHER): Payer: Medicare HMO | Admitting: Family Medicine

## 2015-11-21 VITALS — BP 123/81 | HR 81 | Temp 98.3°F | Resp 17 | Ht 61.0 in | Wt 177.1 lb

## 2015-11-21 DIAGNOSIS — E1122 Type 2 diabetes mellitus with diabetic chronic kidney disease: Secondary | ICD-10-CM | POA: Diagnosis not present

## 2015-11-21 DIAGNOSIS — E782 Mixed hyperlipidemia: Secondary | ICD-10-CM

## 2015-11-21 DIAGNOSIS — R2241 Localized swelling, mass and lump, right lower limb: Secondary | ICD-10-CM

## 2015-11-21 DIAGNOSIS — E038 Other specified hypothyroidism: Secondary | ICD-10-CM | POA: Diagnosis not present

## 2015-11-21 DIAGNOSIS — I10 Essential (primary) hypertension: Secondary | ICD-10-CM | POA: Diagnosis not present

## 2015-11-21 LAB — BASIC METABOLIC PANEL
BUN: 34 mg/dL — ABNORMAL HIGH (ref 6–23)
CALCIUM: 10.1 mg/dL (ref 8.4–10.5)
CO2: 28 meq/L (ref 19–32)
CREATININE: 2.39 mg/dL — AB (ref 0.40–1.20)
Chloride: 103 mEq/L (ref 96–112)
GFR: 22.69 mL/min — ABNORMAL LOW (ref >60.00)
Glucose, Bld: 136 mg/dL — ABNORMAL HIGH (ref 70–99)
Potassium: 5.7 mEq/L — ABNORMAL HIGH (ref 3.5–5.1)
Sodium: 139 mEq/L (ref 135–145)

## 2015-11-21 LAB — CBC WITH DIFFERENTIAL/PLATELET
BASOS ABS: 0.1 10*3/uL (ref 0.0–0.1)
Basophils Relative: 0.7 % (ref 0.0–3.0)
Eosinophils Absolute: 0.2 10*3/uL (ref 0.0–0.7)
Eosinophils Relative: 2.5 % (ref 0.0–5.0)
HCT: 32.7 % — ABNORMAL LOW (ref 36.0–46.0)
Hemoglobin: 10.4 g/dL — ABNORMAL LOW (ref 12.0–15.0)
LYMPHS ABS: 1.7 10*3/uL (ref 0.7–4.0)
Lymphocytes Relative: 20.3 % (ref 12.0–46.0)
MCHC: 32 g/dL (ref 30.0–36.0)
MCV: 84.1 fl (ref 78.0–100.0)
MONO ABS: 0.6 10*3/uL (ref 0.1–1.0)
Monocytes Relative: 6.6 % (ref 3.0–12.0)
NEUTROS PCT: 69.9 % (ref 43.0–77.0)
Neutro Abs: 5.9 10*3/uL (ref 1.4–7.7)
Platelets: 458 10*3/uL — ABNORMAL HIGH (ref 150.0–400.0)
RBC: 3.89 Mil/uL (ref 3.87–5.11)
RDW: 15.7 % — ABNORMAL HIGH (ref 11.5–15.5)
WBC: 8.5 10*3/uL (ref 4.0–10.5)

## 2015-11-21 LAB — LIPID PANEL
CHOL/HDL RATIO: 5
Cholesterol: 171 mg/dL (ref 0–200)
HDL: 32.5 mg/dL — AB (ref >39.00)
NONHDL: 138.13
TRIGLYCERIDES: 336 mg/dL — AB (ref 0.0–149.0)
VLDL: 67.2 mg/dL — ABNORMAL HIGH (ref 0.0–40.0)

## 2015-11-21 LAB — HEPATIC FUNCTION PANEL
ALT: 9 U/L (ref 0–35)
AST: 13 U/L (ref 0–37)
Albumin: 4.1 g/dL (ref 3.5–5.2)
Alkaline Phosphatase: 46 U/L (ref 39–117)
BILIRUBIN TOTAL: 0.3 mg/dL (ref 0.2–1.2)
Bilirubin, Direct: 0.1 mg/dL (ref 0.0–0.3)
Total Protein: 6.9 g/dL (ref 6.0–8.3)

## 2015-11-21 LAB — HEMOGLOBIN A1C: Hgb A1c MFr Bld: 6.8 % — ABNORMAL HIGH (ref 4.6–6.5)

## 2015-11-21 LAB — LDL CHOLESTEROL, DIRECT: LDL DIRECT: 92 mg/dL

## 2015-11-21 LAB — TSH: TSH: 3.55 u[IU]/mL (ref 0.35–4.50)

## 2015-11-21 MED ORDER — FENOFIBRATE 160 MG PO TABS: 160.0000 mg | ORAL_TABLET | Freq: Every day | ORAL | 1 refills | Status: DC

## 2015-11-21 NOTE — Assessment & Plan Note (Signed)
Chronic problem.  Pt is not currently on statin due to previous intolerance (myalgias).  She is not willing to restart.  Refill provided on Fenofibrate.  If lipids are again elevated, will start Zetia.  Pt expressed understanding and is in agreement w/ plan.

## 2015-11-21 NOTE — Progress Notes (Signed)
Pre visit review using our clinic review tool, if applicable. No additional management support is needed unless otherwise documented below in the visit note. 

## 2015-11-21 NOTE — Patient Instructions (Signed)
Follow up in 3-4 months to recheck diabetes We'll notify you of your lab results and make any changes if needed Please call and schedule your eye exam!!! Continue to work on healthy diet and regular exercise We'll call you with your podiatry appt for the cyst Call with any questions or concerns Hang in there!!!

## 2015-11-21 NOTE — Assessment & Plan Note (Signed)
Chronic problem.  Currently well controlled.  Asymptomatic.  No med changes at this time.

## 2015-11-21 NOTE — Progress Notes (Signed)
   Subjective:    Patient ID: Marissa Scott, female    DOB: September 18, 1965, 50 y.o.   MRN: [id number]  HPI HTN- chronic problem, on Coreg w/ adequate control.  No CP, SOB, HAs, visual changes, edema.  Hyperlipidemia- chronic problem.  Pt is not taking the Pravastatin but is taking the Fenofibrate.  Pt reports 'everytime i've taken statins, they kill me'.  Pt has lupus.     DM- chronic problem, due for appt w/ Dr Loanne Drilling.  Pt states the Endo says I can refill meds and follow labs but this was not communicated to me.  Due for eye exam- pt plans to schedule.  No numbness/tingling of hands/feet.  Following w/ nephrology for chronic renal disease.  Hypothyroid- pt has not been able to take Levothyroxine due to hives when she takes the medication.  Hair has almost completely fallen out.   Review of Systems For ROS see HPI     Objective:   Physical Exam  Constitutional: She is oriented to person, place, and time. She appears well-developed and well-nourished. No distress.  HENT:  Head: Normocephalic and atraumatic.  Eyes: Conjunctivae and EOM are normal. Pupils are equal, round, and reactive to light.  Neck: Normal range of motion. Neck supple. No thyromegaly present.  Cardiovascular: Normal rate, regular rhythm, normal heart sounds and intact distal pulses.   No murmur heard. Pulmonary/Chest: Effort normal and breath sounds normal. No respiratory distress.  Abdominal: Soft. She exhibits no distension. There is no tenderness.  Musculoskeletal: She exhibits deformity (cystic lesion over R 1st MTP joint). She exhibits no edema or tenderness.  Lymphadenopathy:    She has no cervical adenopathy.  Neurological: She is alert and oriented to person, place, and time.  Skin: Skin is warm and dry.  Psychiatric: She has a normal mood and affect. Her behavior is normal.  Vitals reviewed.         Assessment & Plan:

## 2015-11-21 NOTE — Assessment & Plan Note (Signed)
Per pt, she was told that I am now managing her refills and diabetes and she only needs to return to Endo as needed.  I was not aware of this.  She is overdue for eye exam- she states she will schedule.  I strongly encouraged this.  Not on ACE/ARB due to chronic renal insufficiency- she is following w/ Nephrology.  Foot exam done today.  Check labs.  Adjust meds prn

## 2015-11-21 NOTE — Assessment & Plan Note (Signed)
Ongoing issue for pt.  She reports hives from Levothyroxine and has not been taking any medication.  She reports almost all her hair has fallen out and it is noticeably thinner on exam today.  Check labs.  Start Armour thyroid if labs are abnormal and monitor for possible reaction.  Pt expressed understanding and is in agreement w/ plan.

## 2015-11-22 ENCOUNTER — Encounter: Payer: Self-pay | Admitting: Family Medicine

## 2015-11-22 ENCOUNTER — Other Ambulatory Visit: Payer: Self-pay | Admitting: Family Medicine

## 2015-11-22 DIAGNOSIS — E875 Hyperkalemia: Secondary | ICD-10-CM

## 2015-11-22 DIAGNOSIS — R7989 Other specified abnormal findings of blood chemistry: Secondary | ICD-10-CM

## 2015-11-25 ENCOUNTER — Encounter: Payer: Self-pay | Admitting: Podiatry

## 2015-11-25 ENCOUNTER — Ambulatory Visit (INDEPENDENT_AMBULATORY_CARE_PROVIDER_SITE_OTHER): Payer: Commercial Managed Care - HMO | Admitting: Podiatry

## 2015-11-25 ENCOUNTER — Ambulatory Visit (INDEPENDENT_AMBULATORY_CARE_PROVIDER_SITE_OTHER): Payer: Commercial Managed Care - HMO

## 2015-11-25 DIAGNOSIS — M6740 Ganglion, unspecified site: Secondary | ICD-10-CM | POA: Diagnosis not present

## 2015-11-25 DIAGNOSIS — R52 Pain, unspecified: Secondary | ICD-10-CM

## 2015-11-25 NOTE — Progress Notes (Signed)
   Subjective:    Patient ID: Marissa Scott, female    DOB: 12-Sep-1965, 50 y.o.   MRN: [id number]  HPI  50 year old female presents the office today for soft tissue mass present on the medial aspect of the foot on the right side. This has been ongoing for several weeks. She has a history of gout as is her ankle about 1 year ago. She states that she previously had some swelling to the right foot about 6 weeks ago however she was on prednisone for another issue not resolved however the mass on the foot has remained. She states it has been getting bigger and hurts with shoes. No other treatments for the mass. No other complaints.  Review of Systems  Musculoskeletal: Positive for back pain and gait problem.       MUSCLE AND JOINT PAIN  Skin:       THICK SCARS  Hematological: Bruises/bleeds easily.  All other systems reviewed and are negative.      Objective:   Physical Exam General: AAO x3, NAD  Dermatological: Skin is warm, dry and supple bilateral. Nails x 10 are well manicured; remaining integument appears unremarkable at this time. There are no open sores, no preulcerative lesions, no rash or signs of infection present.  Vascular: Dorsalis Pedis artery and Posterior Tibial artery pedal pulses are 2/4 bilateral with immedate capillary fill time. There is no pain with calf compression, swelling, warmth, erythema.   Neruologic: Grossly intact via light touch bilateral. Vibratory intact via tuning fork bilateral. Protective threshold with Semmes Wienstein monofilament intact to all pedal sites bilateral.   Musculoskeletal: Soft tissue masses palpable in the medial aspect of the right foot on the first metatarsal phalangeal joint. This appears to be fluid-filled soft tissue mobile mass. Mild tenderness palpation to the area. There is no skin breakdown. Mild erythema likely from irritation issues. There is no ascending cellulitis. There is no crepitation. There is no other areas of tenderness at  this time. No other areas of edema, erythema or increase in warmth.  Gait: Unassisted, Nonantalgic.      Assessment & Plan:  50 year old female right foot soft tissue mass likely ganglion cyst -Treatment options discussed including all alternatives, risks, and complications -Etiology of symptoms were discussed -X-rays were obtained and reviewed with the patient. No evidence of acute fracture, foreign body. No bony destruction. -I discussed that steroid injection, drainage of the cyst. Understanding risks complications she wishes to proceed. Under sterile conditions a mixture of Marcaine and dexamethasone was infiltrated into the soft tissue mass. However upon injection afterwards I was able to express clear, jelly fluid within the mass. At this point the mass was deflated. In about ointment was applied followed by compression bandage. Post procedure tractions were discussed. Monitor for any recurrent or signs or symptoms of infection. -Follow-up if symptoms recur worsening. Call any questions or concerns.  Celesta Gentile, DPM

## 2015-11-25 NOTE — Patient Instructions (Signed)
Ganglion Cyst  A ganglion cyst is a noncancerous, fluid-filled lump that occurs near joints or tendons. The ganglion cyst grows out of a joint or the lining of a tendon. It most often develops in the hand or wrist, but it can also develop in the shoulder, elbow, hip, knee, ankle, or foot. The round or oval ganglion cyst can be the size of a pea or larger than a grape. Increased activity may enlarge the size of the cyst because more fluid starts to build up.   CAUSES  It is not known what causes a ganglion cyst to grow. However, it may be related to:  · Inflammation or irritation around the joint.  · An injury.  · Repetitive movements or overuse.  · Arthritis.  RISK FACTORS  Risk factors include:  · Being a woman.  · Being age 20-50.  SIGNS AND SYMPTOMS  Symptoms may include:   · A lump. This most often appears on the hand or wrist, but it can occur in other areas of the body.  · Tingling.  · Pain.  · Numbness.  · Muscle weakness.  · Weak grip.  · Less movement in a joint.  DIAGNOSIS  Ganglion cysts are most often diagnosed based on a physical exam. Your health care provider will feel the lump and may shine a light alongside it. If it is a ganglion cyst, a light often shines through it. Your health care provider may order an X-ray, ultrasound, or MRI to rule out other conditions.  TREATMENT  Ganglion cysts usually go away on their own without treatment. If pain or other symptoms are involved, treatment may be needed. Treatment is also needed if the ganglion cyst limits your movement or if it gets infected. Treatment may include:  · Wearing a brace or splint on your wrist or finger.  · Taking anti-inflammatory medicine.  · Draining fluid from the lump with a needle (aspiration).  · Injecting a steroid into the joint.  · Surgery to remove the ganglion cyst.  HOME CARE INSTRUCTIONS  · Do not press on the ganglion cyst, poke it with a needle, or hit it.  · Take medicines only as directed by your health care  provider.  · Wear your brace or splint as directed by your health care provider.  · Watch your ganglion cyst for any changes.  · Keep all follow-up visits as directed by your health care provider. This is important.  SEEK MEDICAL CARE IF:  · Your ganglion cyst becomes larger or more painful.  · You have increased redness, red streaks, or swelling.  · You have pus coming from the lump.  · You have weakness or numbness in the affected area.  · You have a fever or chills.     This information is not intended to replace advice given to you by your health care provider. Make sure you discuss any questions you have with your health care provider.     Document Released: 01/17/2000 Document Revised: 02/09/2014 Document Reviewed: 07/04/2013  Elsevier Interactive Patient Education ©2016 [employer]vier Inc.

## 2015-11-26 DIAGNOSIS — M6740 Ganglion, unspecified site: Secondary | ICD-10-CM | POA: Insufficient documentation

## 2015-11-27 DIAGNOSIS — Z79899 Other long term (current) drug therapy: Secondary | ICD-10-CM | POA: Diagnosis not present

## 2015-11-29 DIAGNOSIS — I10 Essential (primary) hypertension: Secondary | ICD-10-CM | POA: Diagnosis not present

## 2015-11-29 DIAGNOSIS — L4050 Arthropathic psoriasis, unspecified: Secondary | ICD-10-CM | POA: Diagnosis not present

## 2015-11-29 DIAGNOSIS — D649 Anemia, unspecified: Secondary | ICD-10-CM | POA: Diagnosis not present

## 2015-11-29 DIAGNOSIS — E790 Hyperuricemia without signs of inflammatory arthritis and tophaceous disease: Secondary | ICD-10-CM | POA: Diagnosis not present

## 2015-11-29 DIAGNOSIS — E669 Obesity, unspecified: Secondary | ICD-10-CM | POA: Diagnosis not present

## 2015-11-29 DIAGNOSIS — N183 Chronic kidney disease, stage 3 (moderate): Secondary | ICD-10-CM | POA: Diagnosis not present

## 2015-11-29 DIAGNOSIS — L930 Discoid lupus erythematosus: Secondary | ICD-10-CM | POA: Diagnosis not present

## 2015-11-29 DIAGNOSIS — R809 Proteinuria, unspecified: Secondary | ICD-10-CM | POA: Diagnosis not present

## 2015-11-29 DIAGNOSIS — E119 Type 2 diabetes mellitus without complications: Secondary | ICD-10-CM | POA: Diagnosis not present

## 2015-12-09 ENCOUNTER — Other Ambulatory Visit (HOSPITAL_COMMUNITY): Payer: Self-pay | Admitting: *Deleted

## 2015-12-10 ENCOUNTER — Ambulatory Visit (HOSPITAL_COMMUNITY)
Admission: RE | Admit: 2015-12-10 | Discharge: 2015-12-10 | Disposition: A | Discharge status: Home / Self Care | Payer: Commercial Managed Care - HMO | Source: Ambulatory Visit | Attending: Nephrology | Admitting: Nephrology

## 2015-12-10 DIAGNOSIS — D509 Iron deficiency anemia, unspecified: Secondary | ICD-10-CM | POA: Insufficient documentation

## 2015-12-10 MED ORDER — SODIUM CHLORIDE 0.9 % IV SOLN
510.0000 mg | Freq: Once | INTRAVENOUS | Status: AC
  Administered 2015-12-10: 510 mg via INTRAVENOUS
  Filled 2015-12-10: qty 17

## 2015-12-17 DIAGNOSIS — M47812 Spondylosis without myelopathy or radiculopathy, cervical region: Secondary | ICD-10-CM | POA: Diagnosis not present

## 2015-12-17 DIAGNOSIS — M47816 Spondylosis without myelopathy or radiculopathy, lumbar region: Secondary | ICD-10-CM | POA: Diagnosis not present

## 2015-12-31 DIAGNOSIS — Z79899 Other long term (current) drug therapy: Secondary | ICD-10-CM | POA: Diagnosis not present

## 2016-01-03 ENCOUNTER — Other Ambulatory Visit: Payer: Self-pay | Admitting: Family Medicine

## 2016-01-13 DIAGNOSIS — Z888 Allergy status to other drugs, medicaments and biological substances status: Secondary | ICD-10-CM | POA: Diagnosis not present

## 2016-01-13 DIAGNOSIS — I10 Essential (primary) hypertension: Secondary | ICD-10-CM | POA: Diagnosis not present

## 2016-01-13 DIAGNOSIS — M545 Low back pain: Secondary | ICD-10-CM | POA: Diagnosis not present

## 2016-01-13 DIAGNOSIS — T85193A Other mechanical complication of implanted electronic neurostimulator, generator, initial encounter: Secondary | ICD-10-CM | POA: Diagnosis not present

## 2016-01-13 DIAGNOSIS — M47816 Spondylosis without myelopathy or radiculopathy, lumbar region: Secondary | ICD-10-CM | POA: Diagnosis not present

## 2016-01-13 DIAGNOSIS — Z880 Allergy status to penicillin: Secondary | ICD-10-CM | POA: Diagnosis not present

## 2016-01-13 DIAGNOSIS — Z981 Arthrodesis status: Secondary | ICD-10-CM | POA: Diagnosis not present

## 2016-01-13 DIAGNOSIS — E039 Hypothyroidism, unspecified: Secondary | ICD-10-CM | POA: Diagnosis not present

## 2016-01-13 DIAGNOSIS — E119 Type 2 diabetes mellitus without complications: Secondary | ICD-10-CM | POA: Diagnosis not present

## 2016-01-13 DIAGNOSIS — M329 Systemic lupus erythematosus, unspecified: Secondary | ICD-10-CM | POA: Diagnosis not present

## 2016-02-11 ENCOUNTER — Telehealth: Payer: Self-pay

## 2016-02-11 NOTE — Telephone Encounter (Signed)
error 

## 2016-02-25 ENCOUNTER — Other Ambulatory Visit: Payer: Self-pay | Admitting: Obstetrics & Gynecology

## 2016-02-25 DIAGNOSIS — Z1231 Encounter for screening mammogram for malignant neoplasm of breast: Secondary | ICD-10-CM

## 2016-03-06 ENCOUNTER — Ambulatory Visit
Admission: RE | Admit: 2016-03-06 | Discharge: 2016-03-06 | Disposition: A | Discharge status: Home / Self Care | Payer: Commercial Managed Care - HMO | Source: Ambulatory Visit | Attending: Obstetrics & Gynecology | Admitting: Obstetrics & Gynecology

## 2016-03-06 DIAGNOSIS — Z1231 Encounter for screening mammogram for malignant neoplasm of breast: Secondary | ICD-10-CM

## 2016-03-13 DIAGNOSIS — Z124 Encounter for screening for malignant neoplasm of cervix: Secondary | ICD-10-CM | POA: Diagnosis not present

## 2016-03-13 DIAGNOSIS — Z6835 Body mass index (BMI) 35.0-35.9, adult: Secondary | ICD-10-CM | POA: Diagnosis not present

## 2016-03-19 ENCOUNTER — Ambulatory Visit: Payer: Medicare HMO | Admitting: Family Medicine

## 2016-03-25 DIAGNOSIS — M47816 Spondylosis without myelopathy or radiculopathy, lumbar region: Secondary | ICD-10-CM | POA: Diagnosis not present

## 2016-03-25 DIAGNOSIS — Z9689 Presence of other specified functional implants: Secondary | ICD-10-CM | POA: Diagnosis not present

## 2016-03-25 DIAGNOSIS — I70201 Unspecified atherosclerosis of native arteries of extremities, right leg: Secondary | ICD-10-CM | POA: Diagnosis not present

## 2016-03-25 DIAGNOSIS — S8991XA Unspecified injury of right lower leg, initial encounter: Secondary | ICD-10-CM | POA: Diagnosis not present

## 2016-03-25 DIAGNOSIS — R937 Abnormal findings on diagnostic imaging of other parts of musculoskeletal system: Secondary | ICD-10-CM | POA: Diagnosis not present

## 2016-03-25 DIAGNOSIS — M25561 Pain in right knee: Secondary | ICD-10-CM | POA: Diagnosis not present

## 2016-03-25 DIAGNOSIS — Z981 Arthrodesis status: Secondary | ICD-10-CM | POA: Diagnosis not present

## 2016-03-27 ENCOUNTER — Encounter: Payer: Self-pay | Admitting: Family Medicine

## 2016-03-27 DIAGNOSIS — M25561 Pain in right knee: Secondary | ICD-10-CM

## 2016-03-27 DIAGNOSIS — M2550 Pain in unspecified joint: Secondary | ICD-10-CM

## 2016-04-02 DIAGNOSIS — M25561 Pain in right knee: Secondary | ICD-10-CM | POA: Diagnosis not present

## 2016-04-02 DIAGNOSIS — M94261 Chondromalacia, right knee: Secondary | ICD-10-CM | POA: Diagnosis not present

## 2016-04-03 DIAGNOSIS — L932 Other local lupus erythematosus: Secondary | ICD-10-CM | POA: Diagnosis not present

## 2016-04-03 DIAGNOSIS — L4050 Arthropathic psoriasis, unspecified: Secondary | ICD-10-CM | POA: Diagnosis not present

## 2016-04-03 DIAGNOSIS — Z79899 Other long term (current) drug therapy: Secondary | ICD-10-CM | POA: Diagnosis not present

## 2016-04-20 ENCOUNTER — Encounter: Payer: Self-pay | Admitting: Family Medicine

## 2016-04-20 ENCOUNTER — Ambulatory Visit (INDEPENDENT_AMBULATORY_CARE_PROVIDER_SITE_OTHER): Payer: Medicare HMO | Admitting: Family Medicine

## 2016-04-20 ENCOUNTER — Telehealth: Payer: Self-pay | Admitting: Genetics

## 2016-04-20 VITALS — BP 131/78 | HR 68 | Temp 98.2°F | Resp 17 | Ht 60.0 in | Wt 187.5 lb

## 2016-04-20 DIAGNOSIS — Z800 Family history of malignant neoplasm of digestive organs: Secondary | ICD-10-CM

## 2016-04-20 DIAGNOSIS — E1122 Type 2 diabetes mellitus with diabetic chronic kidney disease: Secondary | ICD-10-CM | POA: Diagnosis not present

## 2016-04-20 LAB — CBC WITH DIFFERENTIAL/PLATELET
BASOS PCT: 1.1 % (ref 0.0–3.0)
Basophils Absolute: 0.1 10*3/uL (ref 0.0–0.1)
EOS ABS: 0.3 10*3/uL (ref 0.0–0.7)
Eosinophils Relative: 2.5 % (ref 0.0–5.0)
HCT: 32.9 % — ABNORMAL LOW (ref 36.0–46.0)
Hemoglobin: 10.7 g/dL — ABNORMAL LOW (ref 12.0–15.0)
LYMPHS PCT: 24.9 % (ref 12.0–46.0)
Lymphs Abs: 2.6 10*3/uL (ref 0.7–4.0)
MCHC: 32.4 g/dL (ref 30.0–36.0)
MCV: 82.7 fl (ref 78.0–100.0)
Monocytes Absolute: 0.6 10*3/uL (ref 0.1–1.0)
Monocytes Relative: 5.8 % (ref 3.0–12.0)
NEUTROS ABS: 6.7 10*3/uL (ref 1.4–7.7)
Neutrophils Relative %: 65.7 % (ref 43.0–77.0)
PLATELETS: 318 10*3/uL (ref 150.0–400.0)
RBC: 3.98 Mil/uL (ref 3.87–5.11)
RDW: 14.8 % (ref 11.5–15.5)
WBC: 10.3 10*3/uL (ref 4.0–10.5)

## 2016-04-20 LAB — BASIC METABOLIC PANEL
BUN: 36 mg/dL — ABNORMAL HIGH (ref 6–23)
CALCIUM: 9.8 mg/dL (ref 8.4–10.5)
CO2: 26 meq/L (ref 19–32)
CREATININE: 1.44 mg/dL — AB (ref 0.40–1.20)
Chloride: 109 mEq/L (ref 96–112)
GFR: 40.65 mL/min — ABNORMAL LOW (ref >60.00)
GLUCOSE: 109 mg/dL — AB (ref 70–99)
Potassium: 5 mEq/L (ref 3.5–5.1)
SODIUM: 142 meq/L (ref 135–145)

## 2016-04-20 LAB — TSH: TSH: 2.27 u[IU]/mL (ref 0.35–4.50)

## 2016-04-20 LAB — HEMOGLOBIN A1C: Hgb A1c MFr Bld: 6.4 % (ref 4.6–6.5)

## 2016-04-20 NOTE — Assessment & Plan Note (Signed)
Chronic problem.  Currently on Actos w/ hx of good control.  Must be very careful w/ medications due to pt's chronic kidney issues.  Due for eye exam- referral placed.  UTD on foot exam.  Following w/ renal for chronic kidney disease.  Stressed need for healthy diet and regular exercise.  Check labs.  Adjust meds prn

## 2016-04-20 NOTE — Progress Notes (Signed)
   Subjective:    Patient ID: Marissa Scott, female    DOB: 10-05-1965, 51 y.o.   MRN: [id number]  HPI DM- chronic problem, on Actos.  Hx of chronic kidney disease.  Due for eye exam.  UTD on foot exam.  Pt has gained 13 lbs since last visit.  Not able to exercise due to chronic pain.  Pt reports she has been 'off and on prednisone' so is concerned that 'sugars are out of whack'.  Rare symptomatic lows.  No numbness/tingling of hands/feet.  Denies CP, SOB, HAs, visual changes, edema.   Review of Systems For ROS see HPI     Objective:   Physical Exam  Constitutional: She is oriented to person, place, and time. She appears well-developed and well-nourished. No distress.  HENT:  Head: Normocephalic and atraumatic.  Eyes: Conjunctivae and EOM are normal. Pupils are equal, round, and reactive to light.  Neck: Normal range of motion. Neck supple. No thyromegaly present.  Cardiovascular: Normal rate, regular rhythm, normal heart sounds and intact distal pulses.   No murmur heard. Pulmonary/Chest: Effort normal and breath sounds normal. No respiratory distress.  Abdominal: Soft. She exhibits no distension. There is no tenderness.  Musculoskeletal: She exhibits no edema.  Lymphadenopathy:    She has no cervical adenopathy.  Neurological: She is alert and oriented to person, place, and time.  Skin: Skin is warm and dry.  Psychiatric: She has a normal mood and affect. Her behavior is normal.  Vitals reviewed.         Assessment & Plan:

## 2016-04-20 NOTE — Patient Instructions (Addendum)
Schedule your complete physical in 3-4 months and your annual wellness visit w/ Marissa Scott around the same time [facility] notify you of your lab results and make any changes if needed Continue to work on healthy diet and regular exercise We'll call you with your eye appt I put in the for the Genetic appt Call with any questions or concerns Happy Spring!!!

## 2016-04-20 NOTE — Progress Notes (Signed)
Pre visit review using our clinic review tool, if applicable. No additional management support is needed unless otherwise documented below in the visit note. 

## 2016-04-20 NOTE — Telephone Encounter (Signed)
Tc to the pt to schedule an appt for the pt to see Vicente Males for genetic counseling. Pt has been scheduled for 3/22 at 2pm. Pt is aware to arrive 15 minutes early. Pt agreed to the appt date and time.

## 2016-04-22 DIAGNOSIS — M47816 Spondylosis without myelopathy or radiculopathy, lumbar region: Secondary | ICD-10-CM | POA: Diagnosis not present

## 2016-04-23 ENCOUNTER — Other Ambulatory Visit: Payer: Medicare HMO

## 2016-04-23 ENCOUNTER — Ambulatory Visit (HOSPITAL_BASED_OUTPATIENT_CLINIC_OR_DEPARTMENT_OTHER): Payer: Medicare HMO | Admitting: Genetics

## 2016-04-23 DIAGNOSIS — Z315 Encounter for genetic counseling: Secondary | ICD-10-CM

## 2016-04-23 DIAGNOSIS — Z86010 Personal history of colonic polyps: Secondary | ICD-10-CM

## 2016-04-23 DIAGNOSIS — Z801 Family history of malignant neoplasm of trachea, bronchus and lung: Secondary | ICD-10-CM

## 2016-04-23 DIAGNOSIS — Z800 Family history of malignant neoplasm of digestive organs: Secondary | ICD-10-CM

## 2016-04-23 DIAGNOSIS — Z8481 Family history of carrier of genetic disease: Secondary | ICD-10-CM | POA: Diagnosis not present

## 2016-04-23 NOTE — Progress Notes (Signed)
REFERRING PROVIDER: Midge Minium, MD [address] [place] [address] [place], [place] [address]  PRIMARY PROVIDER:  Annye Asa, MD  PRIMARY REASON FOR VISIT:  1. Family history of Lynch syndrome   2. Family history of colon cancer   3. Family history of colon cancer in father      HISTORY OF PRESENT ILLNESS:   Marissa Scott, a 51 y.o. female, was seen for a [facility] cancer genetics consultation at the request of Marissa Scott due to a family history of cancer.  Marissa Scott presents to clinic today to discuss the possibility of a hereditary predisposition to cancer, genetic testing, and to further clarify her future cancer risks, as well as potential cancer risks for family members.   Marissa Scott is a 51 y.o. female with no personal history of cancer. Her last colonoscopy was 03/2014 and revealed a single tubular adenoma. Her current plan is to return for colonoscopy every 5 years due to her father's history of colon cancer and her history of polyp(s). She had a mammogram in 03/2016 that was BI-RADS 1. At age 67, she underwent left nephrectomy and at 54 underwent partial right nephrectomy for indications unrelated to cancer risk.  CANCER HISTORY:   No history exists.    HORMONAL RISK FACTORS:  Menarche was at age 3.  First live birth at age nulliparous.  OCP use for approximately 10 years.  Ovaries intact: yes.  Hysterectomy: no.  Menopausal status: premenopausal.  HRT use: 0 years. Colonoscopy: yes; abnormal. Mammogram within the last year: yes. Number of breast biopsies: 0. Up to date with pelvic exams:  yes. Any excessive radiation exposure in the past:  no  Past Medical History:  Diagnosis Date  . Arthritis   . DIABETES MELLITUS, TYPE II 01/30/2008  . HYPERLIPIDEMIA 01/30/2008  . HYPERTENSION 01/30/2008  . HYPERURICEMIA 10/01/2008  . Polycystic ovaries 01/04/2009  . RENAL DISEASE, CHRONIC, STAGE III 01/30/2008   Follows w/ Renal    Past Surgical History:  Procedure  Laterality Date  . APPENDECTOMY    . LUMBAR LAMINECTOMY     L4-L5  . NEPHRECTOMY     left removed, partial right-Marissa Scott  . Neural Stimulator    . Right hand cyst    . TUBAL LIGATION     GYN Marissa Scott    Social History   Social History  . Marital status: Married    Spouse name: N/A  . Number of children: N/A  . Years of education: N/A   Occupational History  . Disabled    Social History Main Topics  . Smoking status: Never Smoker  . Smokeless tobacco: Never Used  . Alcohol use Yes     Comment: rarely  . Drug use: No  . Sexual activity: Not on file   Other Topics Concern  . Not on file   Social History Narrative   Disabled for back pain     FAMILY HISTORY:  We obtained a detailed, 4-generation family history.  Significant diagnoses are listed below: Family History  Problem Relation Age of Onset  . Hypertension Father   . Heart disease Father     CAD  . Diabetes Father   . Stroke Father   . Colon cancer Father 85  . Colon cancer Cousin 42    Lynch Syndrome  . Stomach cancer Cousin 56  . Lung cancer Maternal Aunt 60    history of smoking  . Esophageal cancer Neg Hx   . Rectal cancer Neg Hx  Marissa Scott has one adopted son who is 80. She has 2 full-sisters, age 49 and 49. Both sisters have undergone TAH/BSO for reasons unrelated to cancer risk. Neither have a history of cancer.  Marissa Scott is 65 and underwent TAH/BSO in her 80s for indications unrelated to cancer risk. Her Scott has no personal history of cancer. Marissa Scott has 4 full sisters. One sister Marissa Scott) died at 81 from lung cancer and had a history of smoking. Another sister Marissa Scott) was diagnosed with melanoma last week at age 59. The remaining 2 sisters Marissa Scott and Marissa Scott) are without cancers at ages 25 and 10. Marissa Scott's son, Marissa Scott, was diagnosed with colon cancer at 22 and recently stomach cancer at 3. He is reported to have Lynch Syndrome, though his genetic testing result report was  not available for review at the time of our meeting. No other maternal cousins are known to have cancers. All of Marissa Scott's maternal aunts and maternal grandmother have undergone TAH/BSO. Marissa Scott maternal grandmother died at 93 without cancer. She had a brother who died with colon cancer. Marissa Scott maternal grandfather died at 24 with "brown lung" (he worked in a [employer]).  Marissa Scott father is currently 53 and was diagnosed with colon cancer at 24. He was recently diagnosed with melanoma of his head and nose at 50.  He has one full-brother who is without cancers at age 8. However, Marissa Scott reported that she does not have much information about her paternal uncle or his children. Marissa Scott paternal grandparents both died in their 68s without cancers.  Marissa Scott reports that her maternal cousin, Marissa Scott, was told that he inherited Lynch syndrome from his Scott. Though we note that aside from Marissa Scott's diagnoses, her maternal family history is not strongly suggestive of Lynch syndrome. We requested that Marissa Scott obtain a copy of Marissa Scott's genetic testing report for her own records and testing purposes. Marissa Scott maternal ancestors are of English descent, and paternal ancestors are of [place] descent. There is no reported Ashkenazi Jewish ancestry. There is no known consanguinity.  GENETIC COUNSELING ASSESSMENT: Marissa Scott is a 51 y.o. female with a reported family history of Lynch syndrome. We, therefore, discussed and recommended the following at today's visit.   DISCUSSION: We reviewed the characteristics, features and inheritance patterns of hereditary cancer syndromes. We also discussed genetic testing, including the appropriate family members to test, the process of testing, insurance coverage and turn-around-time for results. We discussed the implications of a negative, positive and/or variant of uncertain significant result. We discussed that Marissa Scott has the options to pursue  genetic testing for Lynch syndrome only or through a multi-gene panel. Marissa Scott elected to pursue testing through [facility]'s 43-gene Common Hereditary Cancers Panel, especially due to the fact that she has limited information about her paternal family history.   Based on Marissa Scott's family history of cancer, she meets medical criteria for genetic testing. Despite that she meets criteria, she may still have an out of pocket cost. We discussed that if her out of pocket cost for testing is over $100, the laboratory will call and confirm whether she wants to proceed with testing.  If the out of pocket cost of testing is less than $100 she will be billed by the genetic testing laboratory.   PLAN: After considering the risks, benefits, and limitations, Marissa Scott  provided informed consent to pursue genetic testing and the blood sample was sent to [facility] for analysis  of the 43-gene Common Hereditary Cancers Panel. Results should be available within approximately 3 weeks' time, at which point they will be disclosed by telephone to Marissa Scott, as will any additional recommendations warranted by these results. Marissa Scott will receive a summary of her genetic counseling visit and a copy of her results once available. This information will also be available in Epic. We encouraged Ms. Scism to remain in contact with cancer genetics annually so that we can continuously update the family history and inform her of any changes in cancer genetics and testing that may be of benefit for her family. Ms. Stimson questions were answered to her satisfaction today. Our contact information was provided should additional questions or concerns arise.  Again, I requested that Ms. Dolezal obtain a copy of her cousin Marissa Scott's genetic testing report, as this is essential to accurate interpretation of her results.  Lastly, we encouraged Ms. Dubs to remain in contact with cancer genetics annually so that we can  continuously update the family history and inform her of any changes in cancer genetics and testing that may be of benefit for this family.   Ms.  Levengood questions were answered to her satisfaction today. Our contact information was provided should additional questions or concerns arise. Thank you for the referral and allowing [place] to share in the care of your patient.   Mal Misty, MS, [facility] Certified Genetic Counselor [email or web address] phone: [phone]  The patient was seen for a total of 40 minutes in face-to-face genetic counseling.  This patient was discussed with Drs. Magrinat, Lindi Adie and/or Burr Medico who agrees with the above.    _______________________________________________________________________ For Office Staff:  Number of people involved in session: 1 Was an Intern/ student involved with case: no

## 2016-04-24 ENCOUNTER — Encounter: Payer: Self-pay | Admitting: Genetics

## 2016-04-28 ENCOUNTER — Encounter: Payer: Self-pay | Admitting: Family Medicine

## 2016-04-28 DIAGNOSIS — M25561 Pain in right knee: Secondary | ICD-10-CM | POA: Diagnosis not present

## 2016-04-29 ENCOUNTER — Other Ambulatory Visit: Payer: Self-pay | Admitting: Family Medicine

## 2016-05-11 ENCOUNTER — Ambulatory Visit: Payer: Self-pay | Admitting: Genetics

## 2016-05-11 ENCOUNTER — Telehealth: Payer: Self-pay | Admitting: Genetics

## 2016-05-11 DIAGNOSIS — Z1379 Encounter for other screening for genetic and chromosomal anomalies: Secondary | ICD-10-CM

## 2016-05-11 NOTE — Telephone Encounter (Signed)
Reviewed that germline genetic testing revealed no pathogenic mutations. This is considered to be a negative result.  A variant of uncertain significance (VUS) was noted in BRCA1. The specific BRCA1 variant is c.1508A>G (p.Lys503Arg). Discussed that this VUS should not change clinical management. For more detailed discussion, please see genetic counseling documentation from 05/11/2016. Result report dated 05/08/2016. Testing performed through [facility]'s 43-gene Common Hereditary Cancers Panel. 

## 2016-05-11 NOTE — Telephone Encounter (Deleted)
-----   Message from Mal Misty sent at 04/24/2016  2:49 PM EDT ----- Regarding: Call Results Ask for cousin's results.

## 2016-05-20 DIAGNOSIS — Z1379 Encounter for other screening for genetic and chromosomal anomalies: Secondary | ICD-10-CM | POA: Insufficient documentation

## 2016-05-20 NOTE — Progress Notes (Signed)
HPI: Marissa Scott was previously seen in the [facility] clinic due to a family history of cancer, Lynch syndrome, and concerns regarding a hereditary predisposition to cancer. Please refer to our prior cancer genetics clinic note for more information regarding Marissa Scott's medical, social and family histories, and our assessment and recommendations, at the time. Marissa Scott recent genetic test results were disclosed to her, as were recommendations warranted by these results. These results and recommendations are discussed in more detail below.  CANCER HISTORY:   No history exists.     FAMILY HISTORY:  We obtained a detailed, 4-generation family history.  Significant diagnoses are listed below: Family History  Problem Relation Age of Onset  . Hypertension Father   . Heart disease Father     CAD  . Diabetes Father   . Stroke Father   . Colon cancer Father 29  . Colon cancer Cousin 42    Lynch Syndrome  . Stomach cancer Cousin 59  . Lung cancer Maternal Aunt 60    history of smoking  . Esophageal cancer Neg Hx   . Rectal cancer Neg Hx     GENETIC TEST RESULTS: Genetic testing reported out on 05/08/2016 through the Common Hereditary Cancers Panel performed by [facility] showed no deleterious mutations in the following 43 analyzed: APC, ATM, AXIN2, BARD1, BMPR1A, BRCA1, BRCA2, BRIP1, CDH1, CDKN2A, CHEK2, DICER1, EPCAM, GREM1, HOXB13, KIT, MEN1, MLH1, MSH2, MSH6, MUTYH, NBN, NF1, PALB2, PDGFRA, PMS2, POLD1, POLE, PTEN, RAD50, RAD51C, RAD51D, SDHA, SDHB, SDHC, SDHD, SMAD4, SMARCA4, STK11, TP53, TSC1, TSC2, and VHL.  A variant of uncertain significance (VUS) called BRCA1 c.1508A>G (p.Lys503Arg) was also noted. At this time, it is unknown if this variant is associated with increased cancer risk or if this is a normal finding, but most variants such as this get reclassified to being inconsequential. It should not be used to make medical management decisions. With time, we suspect the  lab will determine the significance of this variant, if any. If we do learn more about it, we will try to contact Marissa Scott to discuss it further. However, it is important she stay in touch with [place] periodically and keep the address and phone number up to date.  Marissa Scott. Kimball test report will be scanned into EPIC and will be located under the Molecular Pathology section of the Results Review tab.A portion of the result report is included below for reference.     It is important to note that Marissa Scott originally reported that her maternal first-cousin, Marissa Scott, had Lynch syndrome. At the time of her initial appointment, we recommended that Marissa Scott have Marissa Scott release his genetic testing results to [place] as knowledge of his genetic testing results is essential to Marissa Scott's risk assessment. Marissa Scott later called [place] to give consent for [place] to access his previous genetic testing results for purpose of accurately counseling his cousin, Marissa Scott. Upon review of Marissa Scott genetic testing results, it appears that Lynch syndrome was suspected due to loss of MLH1 and PMS2 on IHC analysis of his tumor. Furthermore, his tumor was shown to be MSI-H. However, appropriate germline follow-up testing was performed by [facility] on 04/06/2013 and did not reveal a mutation in the MLH1 or PMS2 genes. Further testing performed by [facility] of the following genes was also negative for mutations: APC, MSH2, MSH6, EPCAM, MUTYH. Therefore, Marissa Scott history of colon and stomach cancer at young ages remains unexplained. Though he was advised at the time to pursue  cancer screenings similarly to individuals who have Lynch syndrome, he does not have a molecular diagnosis of Lynch syndrome.  We discussed with Marissa Scott that since the current genetic testing is not perfect, it is possible there may be a mutation in one of these genes that current testing cannot detect, but that chance is small. We also discussed, that it  is possible that another gene that has not yet been discovered, or that we have not yet tested, is responsible for the cancer diagnoses in the family. Therefore, important to remain in touch with cancer genetics in the future so that we can continue to offer Marissa Scott the most up to date genetic testing. Until a causative mutation is identified in the family, her cancer screenings should be based on the observed family history of cancers.  CANCER SCREENING RECOMMENDATIONS:  Given Marissa Scott's personal and family histories, we must interpret these negative results with some caution.  Families with features suggestive of hereditary risk for cancer tend to have multiple family members with cancer, diagnoses in multiple generations and diagnoses before the age of 77. Marissa Scott family exhibits some of these features. Thus this result may simply reflect our current inability to detect all mutations within these genes or there may be a different gene that has not yet been discovered or tested.   This negative genetic test simply tells [place] that we cannot yet define why Marissa Scott's family members have had colon and stomach cancer. Based on family history alone, the [employer]  (NCCN) guidelines state that Marissa Scott. [place] should undergo colonoscopy at least every 5 years due to her father's history of colon cancer. However, she may consider more frequent colonoscopies given her history of polyps and maternal family history of colon cancer in a third degree relative. Though I would recommend colonoscopy every 5 years at a minimum based on family history, I defer to her gastroenterologist regarding whether even more frequent colonoscopies are indicated.  RECOMMENDATIONS FOR FAMILY MEMBERS: Marissa Scott. [place] family members should discuss their family history of cancers with their physicians to establish a personalized cancer screening plan.  I recommend that [name] stay in touch with our clinic and  consider additional genetic testing for hereditary colon and stomach cancers as more options become available. If any family members, such as Marissa Scott, are identified to have a hereditary cancer syndrome, it is recommended that Marissa Scott. [place] notify us of their genetic testing results as this would likely alter her current risk assessment.   FOLLOW-UP: Lastly, we discussed with Marissa Scott. Petros that cancer genetics is a rapidly advancing field and it is possible that new genetic tests will be appropriate for her and/or her family members in the future. We encouraged her to remain in contact with cancer genetics on an annual basis so we can update her personal and family histories and let her know of advances in cancer genetics that may benefit this family.   Our contact number was provided. Marissa Scott. [place] questions were answered to her satisfaction, and she knows she is welcome to call us at anytime with additional questions or concerns.   Mal Misty, Marissa Scott, [facility] Certified [email or web address]

## 2016-06-08 ENCOUNTER — Encounter: Payer: Self-pay | Admitting: Family Medicine

## 2016-07-06 ENCOUNTER — Encounter: Payer: Self-pay | Admitting: Family Medicine

## 2016-07-08 DIAGNOSIS — H524 Presbyopia: Secondary | ICD-10-CM | POA: Diagnosis not present

## 2016-07-08 DIAGNOSIS — H52203 Unspecified astigmatism, bilateral: Secondary | ICD-10-CM | POA: Diagnosis not present

## 2016-07-08 DIAGNOSIS — E119 Type 2 diabetes mellitus without complications: Secondary | ICD-10-CM | POA: Diagnosis not present

## 2016-07-08 DIAGNOSIS — H5203 Hypermetropia, bilateral: Secondary | ICD-10-CM | POA: Diagnosis not present

## 2016-07-08 DIAGNOSIS — Z79899 Other long term (current) drug therapy: Secondary | ICD-10-CM | POA: Diagnosis not present

## 2016-07-20 DIAGNOSIS — E119 Type 2 diabetes mellitus without complications: Secondary | ICD-10-CM | POA: Diagnosis not present

## 2016-07-20 DIAGNOSIS — I10 Essential (primary) hypertension: Secondary | ICD-10-CM | POA: Diagnosis not present

## 2016-07-20 DIAGNOSIS — E669 Obesity, unspecified: Secondary | ICD-10-CM | POA: Diagnosis not present

## 2016-07-20 DIAGNOSIS — L4050 Arthropathic psoriasis, unspecified: Secondary | ICD-10-CM | POA: Diagnosis not present

## 2016-07-20 DIAGNOSIS — N183 Chronic kidney disease, stage 3 (moderate): Secondary | ICD-10-CM | POA: Diagnosis not present

## 2016-07-20 DIAGNOSIS — L930 Discoid lupus erythematosus: Secondary | ICD-10-CM | POA: Diagnosis not present

## 2016-07-20 DIAGNOSIS — D649 Anemia, unspecified: Secondary | ICD-10-CM | POA: Diagnosis not present

## 2016-07-24 ENCOUNTER — Other Ambulatory Visit: Payer: Self-pay | Admitting: Family Medicine

## 2016-07-24 DIAGNOSIS — M47816 Spondylosis without myelopathy or radiculopathy, lumbar region: Secondary | ICD-10-CM | POA: Diagnosis not present

## 2016-07-24 DIAGNOSIS — M47812 Spondylosis without myelopathy or radiculopathy, cervical region: Secondary | ICD-10-CM | POA: Diagnosis not present

## 2016-07-24 DIAGNOSIS — S322XXD Fracture of coccyx, subsequent encounter for fracture with routine healing: Secondary | ICD-10-CM | POA: Diagnosis not present

## 2016-08-01 LAB — HM DIABETES EYE EXAM

## 2016-08-24 ENCOUNTER — Other Ambulatory Visit: Payer: Self-pay | Admitting: Family Medicine

## 2016-08-27 ENCOUNTER — Encounter: Payer: Medicare HMO | Admitting: Family Medicine

## 2016-09-07 ENCOUNTER — Encounter: Payer: Self-pay | Admitting: Family Medicine

## 2016-10-04 ENCOUNTER — Encounter: Payer: Self-pay | Admitting: Family Medicine

## 2016-10-06 MED ORDER — HYDROXYCHLOROQUINE SULFATE 200 MG PO TABS: 200.0000 mg | ORAL_TABLET | Freq: Two times a day (BID) | ORAL | 0 refills | Status: DC

## 2016-10-20 DIAGNOSIS — M47812 Spondylosis without myelopathy or radiculopathy, cervical region: Secondary | ICD-10-CM | POA: Diagnosis not present

## 2016-10-20 DIAGNOSIS — M47816 Spondylosis without myelopathy or radiculopathy, lumbar region: Secondary | ICD-10-CM | POA: Diagnosis not present

## 2016-10-25 LAB — HM PAP SMEAR

## 2016-11-06 ENCOUNTER — Encounter: Payer: Self-pay | Admitting: Family Medicine

## 2016-11-06 NOTE — Telephone Encounter (Signed)
She would need appointment.

## 2016-11-09 ENCOUNTER — Encounter: Payer: Medicare HMO | Admitting: Family Medicine

## 2016-12-02 ENCOUNTER — Ambulatory Visit (INDEPENDENT_AMBULATORY_CARE_PROVIDER_SITE_OTHER): Payer: Medicare HMO | Admitting: Family Medicine

## 2016-12-02 ENCOUNTER — Encounter: Payer: Self-pay | Admitting: Family Medicine

## 2016-12-02 VITALS — BP 134/80 | HR 74 | Temp 98.2°F | Resp 16 | Ht 60.0 in | Wt 200.4 lb

## 2016-12-02 DIAGNOSIS — E1122 Type 2 diabetes mellitus with diabetic chronic kidney disease: Secondary | ICD-10-CM

## 2016-12-02 DIAGNOSIS — N183 Chronic kidney disease, stage 3 (moderate): Secondary | ICD-10-CM

## 2016-12-02 DIAGNOSIS — Z0000 Encounter for general adult medical examination without abnormal findings: Secondary | ICD-10-CM

## 2016-12-02 LAB — CBC WITH DIFFERENTIAL/PLATELET
BASOS PCT: 0.8 % (ref 0.0–3.0)
Basophils Absolute: 0.1 10*3/uL (ref 0.0–0.1)
EOS ABS: 0.3 10*3/uL (ref 0.0–0.7)
Eosinophils Relative: 3 % (ref 0.0–5.0)
HEMATOCRIT: 30.4 % — AB (ref 36.0–46.0)
HEMOGLOBIN: 9.6 g/dL — AB (ref 12.0–15.0)
LYMPHS PCT: 23.8 % (ref 12.0–46.0)
Lymphs Abs: 2.3 10*3/uL (ref 0.7–4.0)
MCHC: 31.6 g/dL (ref 30.0–36.0)
MCV: 86.1 fl (ref 78.0–100.0)
MONO ABS: 0.6 10*3/uL (ref 0.1–1.0)
Monocytes Relative: 6.2 % (ref 3.0–12.0)
Neutro Abs: 6.5 10*3/uL (ref 1.4–7.7)
Neutrophils Relative %: 66.2 % (ref 43.0–77.0)
Platelets: 275 10*3/uL (ref 150.0–400.0)
RBC: 3.54 Mil/uL — AB (ref 3.87–5.11)
RDW: 15.5 % (ref 11.5–15.5)
WBC: 9.8 10*3/uL (ref 4.0–10.5)

## 2016-12-02 LAB — HEPATIC FUNCTION PANEL
ALBUMIN: 3.5 g/dL (ref 3.5–5.2)
ALT: 9 U/L (ref 0–35)
AST: 9 U/L (ref 0–37)
Alkaline Phosphatase: 67 U/L (ref 39–117)
BILIRUBIN TOTAL: 0.3 mg/dL (ref 0.2–1.2)
Bilirubin, Direct: 0.1 mg/dL (ref 0.0–0.3)
Total Protein: 6.2 g/dL (ref 6.0–8.3)

## 2016-12-02 LAB — HEMOGLOBIN A1C: HEMOGLOBIN A1C: 6.5 % (ref 4.6–6.5)

## 2016-12-02 LAB — BASIC METABOLIC PANEL
BUN: 36 mg/dL — AB (ref 6–23)
CALCIUM: 9.3 mg/dL (ref 8.4–10.5)
CO2: 27 mEq/L (ref 19–32)
CREATININE: 1.53 mg/dL — AB (ref 0.40–1.20)
Chloride: 107 mEq/L (ref 96–112)
GFR: 37.81 mL/min — ABNORMAL LOW (ref >60.00)
Glucose, Bld: 105 mg/dL — ABNORMAL HIGH (ref 70–99)
Potassium: 5.2 mEq/L — ABNORMAL HIGH (ref 3.5–5.1)
Sodium: 141 mEq/L (ref 135–145)

## 2016-12-02 LAB — LIPID PANEL
CHOL/HDL RATIO: 4
Cholesterol: 164 mg/dL (ref 0–200)
HDL: 36.9 mg/dL — AB (ref >39.00)
NONHDL: 126.98
TRIGLYCERIDES: 221 mg/dL — AB (ref 0.0–149.0)
VLDL: 44.2 mg/dL — ABNORMAL HIGH (ref 0.0–40.0)

## 2016-12-02 LAB — LDL CHOLESTEROL, DIRECT: LDL DIRECT: 96 mg/dL

## 2016-12-02 LAB — TSH: TSH: 7.03 u[IU]/mL — AB (ref 0.35–4.50)

## 2016-12-02 LAB — MICROALBUMIN / CREATININE URINE RATIO
CREATININE, U: 114 mg/dL
MICROALB UR: 84.3 mg/dL — AB (ref 0.0–1.9)
MICROALB/CREAT RATIO: 74 mg/g — AB (ref 0.0–30.0)

## 2016-12-02 NOTE — Progress Notes (Signed)
   Subjective:    Patient ID: Marissa Scott, female    DOB: 04-Jan-1966, 50 y.o.   MRN: [id number]  HPI CPE- UTD on colonoscopy, mammo, pap (Dr Lynnette Caffey).  UTD on eye exam.  Due for foot exam and microalbumin.  Pt declines flu and pneumonia vaccines.  Pt has gained 12 lbs since last visit   Review of Systems Patient reports no vision/ hearing changes, adenopathy,fever, weight change,  persistant/recurrent hoarseness , swallowing issues, chest pain, palpitations, edema, persistant/recurrent cough, hemoptysis, dyspnea (rest/exertional/paroxysmal nocturnal), gastrointestinal bleeding (melena, rectal bleeding), abdominal pain, significant heartburn, GU symptoms (dysuria, hematuria, incontinence), Gyn symptoms (abnormal  bleeding, pain),  syncope, focal weakness, memory loss, numbness & tingling, skin/hair/nail changes, abnormal bruising or bleeding, anxiety, or depression.   + constipation    Objective:   Physical Exam General Appearance:    Alert, cooperative, no distress, appears stated age, obese  Head:    Normocephalic, without obvious abnormality, atraumatic  Eyes:    PERRL, conjunctiva/corneas clear, EOM's intact, fundi    benign, both eyes  Ears:    Normal TM's and external ear canals, both ears  Nose:   Nares normal, septum midline, mucosa normal, no drainage    or sinus tenderness  Throat:   Lips, mucosa, and tongue normal; teeth and gums normal  Neck:   Supple, symmetrical, trachea midline, no adenopathy;    Thyroid: no enlargement/tenderness/nodules  Back:     Symmetric, no curvature, ROM normal, no CVA tenderness  Lungs:     Clear to auscultation bilaterally, respirations unlabored  Chest Wall:    No tenderness or deformity   Heart:    Regular rate and rhythm, S1 and S2 normal, no murmur, rub   or gallop  Breast Exam:    Deferred to GYN  Abdomen:     Soft, non-tender, bowel sounds active all four quadrants,    no masses, no organomegaly  Genitalia:    Deferred to GYN  Rectal:     Extremities:   Extremities normal, atraumatic, no cyanosis or edema.  Soft tissues masses on plantar surface of both feet  Pulses:   2+ and symmetric all extremities  Skin:   Skin color, texture, turgor normal, no rashes or lesions  Lymph nodes:   Cervical, supraclavicular, and axillary nodes normal  Neurologic:   CNII-XII intact, normal strength, sensation and reflexes    throughout          Assessment & Plan:

## 2016-12-02 NOTE — Patient Instructions (Signed)
Follow up in 3-4 months to recheck diabetes We'll notify you of your lab results and make any changes if needed Continue to work on healthy diet and regular exercise Please wear cushioned, supportive footwear when walking (sneakers) If you decide you want a podiatry referral- let me know Call with any questions or concerns Happy Fall!!!

## 2016-12-02 NOTE — Assessment & Plan Note (Signed)
Pt's PE WNL w/ exception of obesity.  UTD on GYN, colonoscopy.  Stressed need for healthy diet and regular exercise- check labs.  Anticipatory guidance provided.

## 2016-12-02 NOTE — Assessment & Plan Note (Signed)
Chronic problem.  Pt has not followed up w/ Endo and is generally noncomplaint w/ appts.  UTD on eye exam.  Foot exam done today.  Check microalbumin.  Stressed need for healthy diet and regular exercise.  Check labs.  Adjust meds prn

## 2016-12-03 ENCOUNTER — Other Ambulatory Visit: Payer: Self-pay | Admitting: General Practice

## 2016-12-03 DIAGNOSIS — E039 Hypothyroidism, unspecified: Secondary | ICD-10-CM

## 2016-12-03 MED ORDER — LEVOTHYROXINE SODIUM 100 MCG PO TABS: 100.0000 ug | ORAL_TABLET | Freq: Every day | ORAL | 3 refills | Status: DC

## 2016-12-15 DIAGNOSIS — E113292 Type 2 diabetes mellitus with mild nonproliferative diabetic retinopathy without macular edema, left eye: Secondary | ICD-10-CM | POA: Diagnosis not present

## 2016-12-15 DIAGNOSIS — H35342 Macular cyst, hole, or pseudohole, left eye: Secondary | ICD-10-CM | POA: Diagnosis not present

## 2016-12-15 DIAGNOSIS — Z79899 Other long term (current) drug therapy: Secondary | ICD-10-CM | POA: Diagnosis not present

## 2016-12-15 DIAGNOSIS — L930 Discoid lupus erythematosus: Secondary | ICD-10-CM | POA: Diagnosis not present

## 2016-12-16 ENCOUNTER — Ambulatory Visit: Payer: Medicare HMO | Admitting: Family Medicine

## 2016-12-28 ENCOUNTER — Other Ambulatory Visit: Payer: Self-pay | Admitting: Family Medicine

## 2016-12-30 DIAGNOSIS — L932 Other local lupus erythematosus: Secondary | ICD-10-CM | POA: Diagnosis not present

## 2016-12-30 DIAGNOSIS — M19021 Primary osteoarthritis, right elbow: Secondary | ICD-10-CM | POA: Diagnosis not present

## 2016-12-30 DIAGNOSIS — L409 Psoriasis, unspecified: Secondary | ICD-10-CM | POA: Diagnosis not present

## 2016-12-30 DIAGNOSIS — Z79899 Other long term (current) drug therapy: Secondary | ICD-10-CM | POA: Diagnosis not present

## 2016-12-30 DIAGNOSIS — M25841 Other specified joint disorders, right hand: Secondary | ICD-10-CM | POA: Diagnosis not present

## 2016-12-30 DIAGNOSIS — Z8739 Personal history of other diseases of the musculoskeletal system and connective tissue: Secondary | ICD-10-CM | POA: Diagnosis not present

## 2016-12-30 DIAGNOSIS — M25842 Other specified joint disorders, left hand: Secondary | ICD-10-CM | POA: Diagnosis not present

## 2016-12-30 DIAGNOSIS — M1990 Unspecified osteoarthritis, unspecified site: Secondary | ICD-10-CM | POA: Diagnosis not present

## 2017-01-07 DIAGNOSIS — Z79899 Other long term (current) drug therapy: Secondary | ICD-10-CM | POA: Diagnosis not present

## 2017-01-07 DIAGNOSIS — E113292 Type 2 diabetes mellitus with mild nonproliferative diabetic retinopathy without macular edema, left eye: Secondary | ICD-10-CM | POA: Diagnosis not present

## 2017-01-21 ENCOUNTER — Telehealth: Payer: Self-pay | Admitting: Family Medicine

## 2017-01-21 NOTE — Telephone Encounter (Signed)
Call [facility] and gave info. CPT 74935 w/ICD codes L93.2, M19.90, L40.9, Z87.39, Z79.899, M25.841, M25.842, M19.021.  I was told by [facility] that prior auth was not needed for this referral.

## 2017-01-21 NOTE — Telephone Encounter (Signed)
Copied from [place] [id number]. Topic: Referral - Request >> Jan 19, 2017  2:15 PM Scherrie Gerlach wrote: Reason for CRM: [facility] calling to request referral from the dr for  Dr Sonny Masters, MD  [facility]  Fax:  [phone] Attn pt's acct no: [id number]  Pt had the appt 12/30/2016 with [facility] Once it has been done, derrick states you can call to update Phone number [phone]

## 2017-01-27 ENCOUNTER — Telehealth: Payer: Self-pay | Admitting: Family Medicine

## 2017-01-27 NOTE — Telephone Encounter (Signed)
Copied from [place] [id number]. Topic: Referral - Request >> Jan 19, 2017  2:15 PM Scherrie Gerlach wrote: Reason for CRM: [facility] calling to request referral from the dr for  Dr Sonny Masters, MD  [facility]  Fax:  [phone] Attn pt's acct no: [id number]  Pt had the appt 12/30/2016 with [facility] Once it has been done, derrick states you can call to update Phone number [phone]

## 2017-02-24 ENCOUNTER — Other Ambulatory Visit: Payer: Self-pay | Admitting: Family Medicine

## 2017-03-03 DIAGNOSIS — M47816 Spondylosis without myelopathy or radiculopathy, lumbar region: Secondary | ICD-10-CM | POA: Diagnosis not present

## 2017-03-15 ENCOUNTER — Other Ambulatory Visit: Payer: Self-pay | Admitting: Obstetrics & Gynecology

## 2017-03-15 DIAGNOSIS — Z139 Encounter for screening, unspecified: Secondary | ICD-10-CM

## 2017-03-19 DIAGNOSIS — E669 Obesity, unspecified: Secondary | ICD-10-CM | POA: Diagnosis not present

## 2017-03-19 DIAGNOSIS — I129 Hypertensive chronic kidney disease with stage 1 through stage 4 chronic kidney disease, or unspecified chronic kidney disease: Secondary | ICD-10-CM | POA: Diagnosis not present

## 2017-03-19 DIAGNOSIS — L4050 Arthropathic psoriasis, unspecified: Secondary | ICD-10-CM | POA: Diagnosis not present

## 2017-03-19 DIAGNOSIS — E1122 Type 2 diabetes mellitus with diabetic chronic kidney disease: Secondary | ICD-10-CM | POA: Diagnosis not present

## 2017-03-19 DIAGNOSIS — M109 Gout, unspecified: Secondary | ICD-10-CM | POA: Diagnosis not present

## 2017-03-19 DIAGNOSIS — D649 Anemia, unspecified: Secondary | ICD-10-CM | POA: Diagnosis not present

## 2017-03-19 DIAGNOSIS — N183 Chronic kidney disease, stage 3 (moderate): Secondary | ICD-10-CM | POA: Diagnosis not present

## 2017-03-19 DIAGNOSIS — L930 Discoid lupus erythematosus: Secondary | ICD-10-CM | POA: Diagnosis not present

## 2017-03-19 DIAGNOSIS — E039 Hypothyroidism, unspecified: Secondary | ICD-10-CM | POA: Diagnosis not present

## 2017-03-29 DIAGNOSIS — M1A00X1 Idiopathic chronic gout, unspecified site, with tophus (tophi): Secondary | ICD-10-CM | POA: Diagnosis not present

## 2017-03-29 DIAGNOSIS — L932 Other local lupus erythematosus: Secondary | ICD-10-CM | POA: Diagnosis not present

## 2017-03-29 DIAGNOSIS — L409 Psoriasis, unspecified: Secondary | ICD-10-CM | POA: Diagnosis not present

## 2017-03-29 DIAGNOSIS — Z1159 Encounter for screening for other viral diseases: Secondary | ICD-10-CM | POA: Diagnosis not present

## 2017-03-29 DIAGNOSIS — M1990 Unspecified osteoarthritis, unspecified site: Secondary | ICD-10-CM | POA: Diagnosis not present

## 2017-03-29 DIAGNOSIS — Z79899 Other long term (current) drug therapy: Secondary | ICD-10-CM | POA: Diagnosis not present

## 2017-04-01 ENCOUNTER — Ambulatory Visit
Admission: RE | Admit: 2017-04-01 | Discharge: 2017-04-01 | Disposition: A | Discharge status: Home / Self Care | Payer: Medicare HMO | Source: Ambulatory Visit | Attending: Obstetrics & Gynecology | Admitting: Obstetrics & Gynecology

## 2017-04-01 DIAGNOSIS — Z1231 Encounter for screening mammogram for malignant neoplasm of breast: Secondary | ICD-10-CM | POA: Diagnosis not present

## 2017-04-01 DIAGNOSIS — Z139 Encounter for screening, unspecified: Secondary | ICD-10-CM

## 2017-04-26 ENCOUNTER — Other Ambulatory Visit: Payer: Self-pay | Admitting: Family Medicine

## 2017-05-05 DIAGNOSIS — Z6834 Body mass index (BMI) 34.0-34.9, adult: Secondary | ICD-10-CM | POA: Diagnosis not present

## 2017-05-05 DIAGNOSIS — Z01419 Encounter for gynecological examination (general) (routine) without abnormal findings: Secondary | ICD-10-CM | POA: Diagnosis not present

## 2017-06-02 DIAGNOSIS — M47816 Spondylosis without myelopathy or radiculopathy, lumbar region: Secondary | ICD-10-CM | POA: Diagnosis not present

## 2017-06-22 DIAGNOSIS — M1990 Unspecified osteoarthritis, unspecified site: Secondary | ICD-10-CM | POA: Diagnosis not present

## 2017-06-22 DIAGNOSIS — Z79899 Other long term (current) drug therapy: Secondary | ICD-10-CM | POA: Diagnosis not present
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
## 2017-08-20 ENCOUNTER — Encounter: Payer: Self-pay | Admitting: Family Medicine

## 2017-08-20 ENCOUNTER — Other Ambulatory Visit: Payer: Self-pay | Admitting: Family Medicine

## 2017-08-20 ENCOUNTER — Other Ambulatory Visit: Payer: Self-pay | Admitting: General Practice

## 2017-08-20 DIAGNOSIS — M79673 Pain in unspecified foot: Secondary | ICD-10-CM

## 2017-08-20 MED ORDER — PIOGLITAZONE HCL 30 MG PO TABS: 30.0000 mg | ORAL_TABLET | Freq: Every day | ORAL | 0 refills | Status: DC

## 2017-08-20 MED ORDER — LEVOTHYROXINE SODIUM 100 MCG PO TABS: 100.0000 ug | ORAL_TABLET | Freq: Every day | ORAL | 0 refills | Status: DC

## 2017-08-20 MED ORDER — CARVEDILOL 12.5 MG PO TABS: 12.5000 mg | ORAL_TABLET | Freq: Two times a day (BID) | ORAL | 0 refills | Status: DC

## 2017-08-20 NOTE — Telephone Encounter (Signed)
Responded to pt mychart message. Advised that she was overdue fro an appt, and that per PCP we could not fill meds to mail order since pt has not been seen.   30 day supply of all meds sent to [facility].

## 2017-08-20 NOTE — Telephone Encounter (Signed)
Pt can have 30 days of each (30 Levo, 60 Carvedilol- but pt has not filled since Jan, and 30 Actos) but no refills and no additional medications w/o appt.  She has multiple medical issues and needs routine follow up.  She has not seen me or Dr Loanne Drilling since October.

## 2017-08-20 NOTE — Telephone Encounter (Signed)
Last OV 12/02/16 Levothyroxine last filled 04/26/17 #30 with 3 Carvedilol last filled 02/25/17 #60 with 0 actos last filled 04/26/17 #90 with 0  Pt would like these sent to mail order. Please advise last OV 12/02/16 pt is listed as diabetic and has no upcoming appts.

## 2017-08-23 ENCOUNTER — Other Ambulatory Visit: Payer: Self-pay | Admitting: Podiatry

## 2017-08-23 ENCOUNTER — Encounter: Payer: Self-pay | Admitting: Podiatry

## 2017-08-23 ENCOUNTER — Ambulatory Visit: Payer: Medicare HMO | Admitting: Podiatry

## 2017-08-23 ENCOUNTER — Ambulatory Visit (INDEPENDENT_AMBULATORY_CARE_PROVIDER_SITE_OTHER): Payer: Medicare HMO

## 2017-08-23 ENCOUNTER — Other Ambulatory Visit: Payer: Self-pay

## 2017-08-23 VITALS — BP 170/93 | HR 88

## 2017-08-23 DIAGNOSIS — M79672 Pain in left foot: Principal | ICD-10-CM

## 2017-08-23 DIAGNOSIS — M109 Gout, unspecified: Secondary | ICD-10-CM

## 2017-08-23 DIAGNOSIS — M79671 Pain in right foot: Secondary | ICD-10-CM

## 2017-08-23 DIAGNOSIS — M722 Plantar fascial fibromatosis: Secondary | ICD-10-CM

## 2017-08-23 DIAGNOSIS — D492 Neoplasm of unspecified behavior of bone, soft tissue, and skin: Secondary | ICD-10-CM

## 2017-08-23 MED ORDER — TRIAMCINOLONE ACETONIDE 10 MG/ML IJ SUSP
10.0000 mg | Freq: Once | INTRAMUSCULAR | Status: AC
  Administered 2017-08-23: 10 mg

## 2017-08-23 NOTE — Progress Notes (Signed)
Subjective:   Patient ID: Marissa Scott, female   DOB: 52 y.o.   MRN: [id number]   HPI Patient presents stating she gets the cyst on the bottom of both heels and the one in the left is been bothering her and make it hard to walk.  Patient does have history of psoriatic arthritis and lupus and is not sure if this may be contributory   ROS      Objective:  Physical Exam  Neurovascular status intact negative Homans sign was noted with patient found to have inflammation with mild cyst of the left medial and lateral side with discoloration under the left lateral side.  It is quite sore along the entire fascial band also and on the right there is a small area of enlargement around the lateral heel.  Patient had a previous cyst drained by Dr. Carman Ching in the last several years     Assessment:  Difficult to tell whether this is a systemic condition or local but there is inflammation present and there is changes consistent with some form of cyst or other inflammatory condition left over right heel     Plan:  H&P and x-rays reviewed and at this point I did do a careful injection of the plantar left heel 3 mg Kenalog 5 Milgram Xylocaine to try to reduce the inflammation pain she is experiencing.  I am sending her for blood work to try to rule out any kind of arthritic issue and she may require biopsy at one point future.  I am referring her to Dr. Carman Ching who is seen her originally in the next 2 weeks to determine the response to medication and whether or not biopsy might be of value and to go over the results of the arthritic profile  X-ray was negative for spurring and indicated mild arthritis but no other pathology

## 2017-08-25 LAB — ANA, IFA COMPREHENSIVE PANEL
ANA: NEGATIVE
ENA SM Ab Ser-aCnc: <1 AI
SCLERODERMA (SCL-70) (ENA) ANTIBODY, IGG: NEGATIVE AI
SM/RNP: <1 AI
SSA (Ro) (ENA) Antibody, IgG: <1 AI
SSB (La) (ENA) Antibody, IgG: <1 AI
ds DNA Ab: <1 IU/mL

## 2017-08-25 LAB — SEDIMENTATION RATE: SED RATE: 62 mm/h — AB (ref 0–30)

## 2017-08-25 LAB — URIC ACID: URIC ACID, SERUM: 11 mg/dL — AB (ref 2.5–7.0)

## 2017-08-25 LAB — C-REACTIVE PROTEIN: CRP: 73 mg/L — ABNORMAL HIGH (ref <8.0)

## 2017-08-25 LAB — RHEUMATOID FACTOR: Rheumatoid fact SerPl-aCnc: <14 [IU]/mL (ref <14)

## 2017-09-07 ENCOUNTER — Ambulatory Visit: Payer: Medicare HMO | Admitting: Podiatry

## 2017-09-07 ENCOUNTER — Encounter: Payer: Self-pay | Admitting: Podiatry

## 2017-09-07 DIAGNOSIS — M109 Gout, unspecified: Secondary | ICD-10-CM

## 2017-09-07 DIAGNOSIS — M6740 Ganglion, unspecified site: Secondary | ICD-10-CM

## 2017-09-10 DIAGNOSIS — M47816 Spondylosis without myelopathy or radiculopathy, lumbar region: Secondary | ICD-10-CM | POA: Diagnosis not present

## 2017-09-13 NOTE — Progress Notes (Signed)
Subjective: 52 year old female presents the office today for follow-up evaluation of a cyst.  She states that the area that I had seen her for a couple years ago did resolve after aspiration and she recently underwent a steroid injection to assist with Dr. Paulla Dolly.  Since the injection the area has resolved.  She states that she intermittently will get a new cyst and once it is drained or a steroid injection is done they resolve. Denies any systemic complaints such as fevers, chills, nausea, vomiting. No acute changes since last appointment, and no other complaints at this time.   Objective: AAO x3, NAD DP/PT pulses palpable bilaterally, CRT less than 3 seconds At this time there is no cystic lesions identified bilaterally.  The area that had steroid injection last appointment with Dr. Paulla Dolly has resolved.  There is no area tenderness identified bilaterally.  There is no edema, erythema, increase in warmth No open lesions or pre-ulcerative lesions.  No pain with calf compression, swelling, warmth, erythema  Assessment: Cysts; gout  Plan: -All treatment options discussed with the patient including all alternatives, risks, complications.  -Blood work is reviewed.  She states that her uric acid level is always high.  We will forward this to her nephrologist.  And consider any long-term medications to help with her uric acid.  In regard to the cysts there is no present cyst identified.  Discussed possible CT scan but will hold off on this for now.  At this point no neurosurgical intervention is warranted as there is no cyst and she also states that once they were drained with a steroid injection was performed they resolved and new one will appear in a different location. -Patient encouraged to call the office with any questions, concerns, change in symptoms.   Trula Slade DPM

## 2017-09-22 ENCOUNTER — Other Ambulatory Visit: Payer: Self-pay

## 2017-09-22 ENCOUNTER — Encounter: Payer: Self-pay | Admitting: Family Medicine

## 2017-09-22 ENCOUNTER — Ambulatory Visit (INDEPENDENT_AMBULATORY_CARE_PROVIDER_SITE_OTHER): Payer: Medicare HMO | Admitting: Family Medicine

## 2017-09-22 VITALS — BP 152/98 | HR 63 | Temp 97.8°F | Resp 16 | Ht 60.0 in | Wt 192.8 lb

## 2017-09-22 DIAGNOSIS — N183 Chronic kidney disease, stage 3 unspecified: Secondary | ICD-10-CM

## 2017-09-22 DIAGNOSIS — E039 Hypothyroidism, unspecified: Secondary | ICD-10-CM

## 2017-09-22 DIAGNOSIS — I10 Essential (primary) hypertension: Secondary | ICD-10-CM | POA: Diagnosis not present

## 2017-09-22 DIAGNOSIS — E1122 Type 2 diabetes mellitus with diabetic chronic kidney disease: Secondary | ICD-10-CM | POA: Diagnosis not present

## 2017-09-22 DIAGNOSIS — E782 Mixed hyperlipidemia: Secondary | ICD-10-CM | POA: Diagnosis not present

## 2017-09-22 LAB — TSH: TSH: 1.41 u[IU]/mL (ref 0.35–4.50)

## 2017-09-22 LAB — CBC WITH DIFFERENTIAL/PLATELET
Basophils Absolute: 0.1 10*3/uL (ref 0.0–0.1)
Basophils Relative: 1 % (ref 0.0–3.0)
EOS PCT: 5.3 % — AB (ref 0.0–5.0)
Eosinophils Absolute: 0.4 10*3/uL (ref 0.0–0.7)
HCT: 32.7 % — ABNORMAL LOW (ref 36.0–46.0)
HEMOGLOBIN: 10.4 g/dL — AB (ref 12.0–15.0)
Lymphocytes Relative: 27.5 % (ref 12.0–46.0)
Lymphs Abs: 2.1 10*3/uL (ref 0.7–4.0)
MCHC: 31.8 g/dL (ref 30.0–36.0)
MCV: 84.3 fl (ref 78.0–100.0)
MONO ABS: 0.5 10*3/uL (ref 0.1–1.0)
Monocytes Relative: 6.6 % (ref 3.0–12.0)
Neutro Abs: 4.5 10*3/uL (ref 1.4–7.7)
Neutrophils Relative %: 59.6 % (ref 43.0–77.0)
Platelets: 254 10*3/uL (ref 150.0–400.0)
RBC: 3.88 Mil/uL (ref 3.87–5.11)
RDW: 16.8 % — ABNORMAL HIGH (ref 11.5–15.5)
WBC: 7.5 10*3/uL (ref 4.0–10.5)

## 2017-09-22 LAB — HEPATIC FUNCTION PANEL
ALT: 10 U/L (ref 0–35)
AST: 8 U/L (ref 0–37)
Albumin: 3.6 g/dL (ref 3.5–5.2)
Alkaline Phosphatase: 58 U/L (ref 39–117)
BILIRUBIN TOTAL: 0.3 mg/dL (ref 0.2–1.2)
Bilirubin, Direct: 0.1 mg/dL (ref 0.0–0.3)
Total Protein: 6.1 g/dL (ref 6.0–8.3)

## 2017-09-22 LAB — BASIC METABOLIC PANEL
BUN: 51 mg/dL — AB (ref 6–23)
CO2: 23 mEq/L (ref 19–32)
CREATININE: 1.83 mg/dL — AB (ref 0.40–1.20)
Calcium: 9 mg/dL (ref 8.4–10.5)
Chloride: 108 mEq/L (ref 96–112)
GFR: 30.66 mL/min — AB (ref >60.00)
Glucose, Bld: 135 mg/dL — ABNORMAL HIGH (ref 70–99)
Potassium: 5 mEq/L (ref 3.5–5.1)
Sodium: 141 mEq/L (ref 135–145)

## 2017-09-22 LAB — LIPID PANEL
CHOL/HDL RATIO: 5
CHOLESTEROL: 215 mg/dL — AB (ref 0–200)
HDL: 41.7 mg/dL (ref >39.00)
NonHDL: 173.66
TRIGLYCERIDES: 376 mg/dL — AB (ref 0.0–149.0)
VLDL: 75.2 mg/dL — AB (ref 0.0–40.0)

## 2017-09-22 LAB — LDL CHOLESTEROL, DIRECT: LDL DIRECT: 109 mg/dL

## 2017-09-22 LAB — HEMOGLOBIN A1C: HEMOGLOBIN A1C: 7.2 % — AB (ref 4.6–6.5)

## 2017-09-22 MED ORDER — AMLODIPINE BESYLATE 5 MG PO TABS: 5.0000 mg | ORAL_TABLET | Freq: Every day | ORAL | 1 refills | Status: DC

## 2017-09-22 NOTE — Assessment & Plan Note (Signed)
Chronic problem.  Pt has DM, HTN, hyperlipidemia in addition to BMI of 37.7 qualifying as morbidly obese.  Stressed need for healthy diet and regular exercise.  Check labs.  Continue to follow.

## 2017-09-22 NOTE — Assessment & Plan Note (Signed)
Chronic problem.  Check labs.  Adjust meds prn  

## 2017-09-22 NOTE — Assessment & Plan Note (Signed)
Chronic problem.  On Actos daily.  UTD on eye exam, foot exam.  Following w/ Nephrology.  Stressed need for healthy diet and regular exercise.  Check labs.  Adjust meds prn

## 2017-09-22 NOTE — Patient Instructions (Signed)
Follow up in 1 month to recheck blood pressure We'll notify you of your lab results and make any changes if needed Continue to work on healthy diet and regular exercise- you can do it! ADD the Amlodipine to improve blood pressure CONTINUE the Carvedilol twice daily Call with any questions or concerns Happy Labor Day!!!

## 2017-09-22 NOTE — Assessment & Plan Note (Signed)
Chronic problem.  Fenofibrate was stopped due to elevated Cr.  Check labs and start statin PRN.

## 2017-09-22 NOTE — Progress Notes (Signed)
   Subjective:    Patient ID: Marissa Scott, female    DOB: 12-18-1965, 52 y.o.   MRN: [id number]  HPI DM- chronic problem.  Pt has not been seen since 12/02/16.  UTD on foot exam, eye exam.  Following w/ renal for her chronic kidney disease.  Not checking sugars.  Taking Actos once daily.  Has rare lows in the AM before breakfast.  No numbness/tingling of hands/feet.  HTN- chronic problem, on Coreg 12.5mg  BID, Lasix 20mg  daily.  Pt reports BP has been elevated 'for the last few months'.  No CP, SOB.  + HAs.  No visual changes, edema.  Hyperlipidemia- chronic problem, previously on Fenofibrate 160mg  daily but this was stopped due to elevated Cr.  No abd pain, N/V.  Hypothyroid- chronic problem, on Levothyroxine 18mcg daily   Review of Systems For ROS see HPI     Objective:   Physical Exam  Constitutional: She is oriented to person, place, and time. She appears well-developed and well-nourished. No distress.  obese  HENT:  Head: Normocephalic and atraumatic.  Eyes: Pupils are equal, round, and reactive to light. Conjunctivae and EOM are normal.  Neck: Normal range of motion. Neck supple. No thyromegaly present.  Cardiovascular: Normal rate, regular rhythm, normal heart sounds and intact distal pulses.  No murmur heard. Pulmonary/Chest: Effort normal and breath sounds normal. No respiratory distress.  Abdominal: Soft. She exhibits no distension. There is no tenderness.  Musculoskeletal: She exhibits no edema.  Lymphadenopathy:    She has no cervical adenopathy.  Neurological: She is alert and oriented to person, place, and time.  Skin: Skin is warm and dry.  Psychiatric: She has a normal mood and affect. Her behavior is normal.  Vitals reviewed.         Assessment & Plan:

## 2017-09-22 NOTE — Assessment & Plan Note (Signed)
Deteriorated. Add Amlodipine due to renal disease and solitary kidney.  Continue to monitor closely.

## 2017-09-23 ENCOUNTER — Other Ambulatory Visit: Payer: Self-pay

## 2017-09-23 MED ORDER — PIOGLITAZONE HCL 30 MG PO TABS: 30.0000 mg | ORAL_TABLET | Freq: Every day | ORAL | 0 refills | Status: DC

## 2017-09-23 MED ORDER — CARVEDILOL 12.5 MG PO TABS: 12.5000 mg | ORAL_TABLET | Freq: Two times a day (BID) | ORAL | 0 refills | Status: DC

## 2017-09-23 MED ORDER — LEVOTHYROXINE SODIUM 100 MCG PO TABS: 100.0000 ug | ORAL_TABLET | Freq: Every day | ORAL | 0 refills | Status: DC

## 2017-09-24 ENCOUNTER — Other Ambulatory Visit: Payer: Self-pay

## 2017-09-24 MED ORDER — SIMVASTATIN 20 MG PO TABS: 20.0000 mg | ORAL_TABLET | Freq: Every day | ORAL | 1 refills | Status: DC

## 2017-10-01 ENCOUNTER — Telehealth: Payer: Self-pay | Admitting: General Practice

## 2017-10-01 NOTE — Telephone Encounter (Signed)
Called pt and left a detailed message. I need to find out if she is currently taking her allopurinol? Her uric acid level is elevated and based on this a prescription should be sent in/or pt needs to restart meds.   Nambe for [name] to Discuss results / PCP recommendations / Schedule patient.

## 2017-10-01 NOTE — Telephone Encounter (Signed)
-----   Message from Marissa Minium, MD sent at 10/01/2017  7:32 AM EDT ----- Thanks for the heads up!  She has Allopurinol on her list but I am not sure whether she is taking it.  She is very noncompliant and tends to only show up here when something is wrong- and not for her regular diabetes follow ups.  I will have my CMA reach out to the pt to determine whether she is taking her med (previously prescribed by Renal)  Thanks! Marissa Scott ----- Message ----- From: Trula Slade, DPM Sent: 09/30/2017   5:38 PM EDT To: Marissa Minium, MD  Dr. Birdie Scott,  I have seen Marissa Scott previously for some "cysts" that will occur on her foot. Another podiatrist in the group did blood work last month and her uric acid was high, and has been high. I didn't know if you thought she should go on any long term medication for her uric acid. Not sure if these "cysts" are gout related. She didn't have any when I last saw her.   Thanks for your input.   Marissa Scott

## 2017-10-13 ENCOUNTER — Encounter: Payer: Self-pay | Admitting: Family Medicine

## 2017-10-25 ENCOUNTER — Ambulatory Visit (INDEPENDENT_AMBULATORY_CARE_PROVIDER_SITE_OTHER): Payer: Medicare HMO | Admitting: Family Medicine

## 2017-10-25 ENCOUNTER — Other Ambulatory Visit: Payer: Self-pay

## 2017-10-25 ENCOUNTER — Encounter: Payer: Self-pay | Admitting: Family Medicine

## 2017-10-25 VITALS — BP 137/84 | HR 75 | Temp 98.1°F | Resp 16 | Ht 60.0 in | Wt 194.4 lb

## 2017-10-25 DIAGNOSIS — H6122 Impacted cerumen, left ear: Secondary | ICD-10-CM

## 2017-10-25 DIAGNOSIS — J309 Allergic rhinitis, unspecified: Secondary | ICD-10-CM

## 2017-10-25 DIAGNOSIS — I10 Essential (primary) hypertension: Secondary | ICD-10-CM | POA: Diagnosis not present

## 2017-10-25 MED ORDER — FLUTICASONE PROPIONATE 50 MCG/ACT NA SUSP: 2.0000 | Freq: Every day | NASAL | 6 refills | Status: DC

## 2017-10-25 NOTE — Assessment & Plan Note (Signed)
New to provider, ongoing for pt.  Add nasal steroid to daily antihistamine.  Reviewed supportive care and red flags that should prompt return.  Pt expressed understanding and is in agreement w/ plan.

## 2017-10-25 NOTE — Assessment & Plan Note (Signed)
Chronic problem.  Better control today since adding Amlodipine.  Currently asymptomatic.  No med changes at this time.  Will follow.

## 2017-10-25 NOTE — Patient Instructions (Signed)
Follow up in 3 months to recheck BP and cholesterol CONTINUE the Amlodipine once daily and the Coreg twice daily CONTINUE the Claritin or Zyrtec daily ADD Flonase- 2 sprays each nostril Call with any questions or concerns Happy Fall!!!

## 2017-10-25 NOTE — Progress Notes (Signed)
   Subjective:    Patient ID: Marissa Scott, female    DOB: 07-12-1965, 52 y.o.   MRN: [id number]  HPI HTN- chronic problem, on Carvedilol 12.5mg  BID and Amlodipine 5mg  was added at last visit.  BP has dropped from 152/98--> 140/81 but remains above goal.  Not checking BP at home.  Pt reports she feels better after a severe headache for the first few days.  No CP, SOB, visual changes, edema.  Allergic Rhinitis- pt reports 'I can't get rid of this sinus stuff'.  Taking Claritin daily.  Was using Mucinex.  Not using nasal spray.  Bilateral ear fullness.   Review of Systems For ROS see HPI     Objective:   Physical Exam  Constitutional: She is oriented to person, place, and time. She appears well-developed and well-nourished. No distress.  HENT:  Head: Normocephalic and atraumatic.  R TM retracted L TM obscured by wax- after successful irrigation was retracted  Eyes: Pupils are equal, round, and reactive to light. Conjunctivae and EOM are normal.  Neck: Normal range of motion. Neck supple. No thyromegaly present.  Cardiovascular: Normal rate, regular rhythm, normal heart sounds and intact distal pulses.  No murmur heard. Pulmonary/Chest: Effort normal and breath sounds normal. No respiratory distress.  Abdominal: Soft. She exhibits no distension. There is no tenderness.  Musculoskeletal: She exhibits no edema.  Lymphadenopathy:    She has no cervical adenopathy.  Neurological: She is alert and oriented to person, place, and time.  Skin: Skin is warm and dry.  Psychiatric: She has a normal mood and affect. Her behavior is normal.  Vitals reviewed.         Assessment & Plan:  Cerumen impaction- successfully irrigated.  sxs improved immediately.  Pt is pleased.

## 2017-10-29 ENCOUNTER — Encounter: Payer: Self-pay | Admitting: Family Medicine

## 2017-11-23 ENCOUNTER — Other Ambulatory Visit: Payer: Self-pay | Admitting: General Practice

## 2017-11-23 MED ORDER — AMLODIPINE BESYLATE 5 MG PO TABS: 5.0000 mg | ORAL_TABLET | Freq: Every day | ORAL | 1 refills | Status: DC

## 2017-11-30 ENCOUNTER — Other Ambulatory Visit: Payer: Self-pay | Admitting: General Practice

## 2017-11-30 MED ORDER — SIMVASTATIN 20 MG PO TABS: 20.0000 mg | ORAL_TABLET | Freq: Every day | ORAL | 1 refills | Status: DC

## 2017-12-01 DIAGNOSIS — N183 Chronic kidney disease, stage 3 (moderate): Secondary | ICD-10-CM | POA: Diagnosis not present

## 2017-12-01 DIAGNOSIS — M109 Gout, unspecified: Secondary | ICD-10-CM | POA: Diagnosis not present

## 2017-12-01 DIAGNOSIS — E669 Obesity, unspecified: Secondary | ICD-10-CM | POA: Diagnosis not present

## 2017-12-01 DIAGNOSIS — L4050 Arthropathic psoriasis, unspecified: Secondary | ICD-10-CM | POA: Diagnosis not present

## 2017-12-01 DIAGNOSIS — E1122 Type 2 diabetes mellitus with diabetic chronic kidney disease: Secondary | ICD-10-CM | POA: Diagnosis not present

## 2017-12-01 DIAGNOSIS — D649 Anemia, unspecified: Secondary | ICD-10-CM | POA: Diagnosis not present

## 2017-12-01 DIAGNOSIS — I129 Hypertensive chronic kidney disease with stage 1 through stage 4 chronic kidney disease, or unspecified chronic kidney disease: Secondary | ICD-10-CM | POA: Diagnosis not present

## 2017-12-10 DIAGNOSIS — E875 Hyperkalemia: Secondary | ICD-10-CM | POA: Diagnosis not present

## 2017-12-15 DIAGNOSIS — M47816 Spondylosis without myelopathy or radiculopathy, lumbar region: Secondary | ICD-10-CM | POA: Diagnosis not present

## 2017-12-15 DIAGNOSIS — M47812 Spondylosis without myelopathy or radiculopathy, cervical region: Secondary | ICD-10-CM | POA: Diagnosis not present

## 2018-01-20 ENCOUNTER — Other Ambulatory Visit: Payer: Self-pay | Admitting: Family Medicine

## 2018-01-24 ENCOUNTER — Ambulatory Visit: Payer: Medicare HMO | Admitting: Family Medicine

## 2018-03-25 DIAGNOSIS — M47816 Spondylosis without myelopathy or radiculopathy, lumbar region: Secondary | ICD-10-CM | POA: Diagnosis not present

## 2018-03-25 DIAGNOSIS — M47812 Spondylosis without myelopathy or radiculopathy, cervical region: Secondary | ICD-10-CM | POA: Diagnosis not present

## 2018-04-29 ENCOUNTER — Other Ambulatory Visit: Payer: Self-pay | Admitting: Obstetrics & Gynecology

## 2018-04-29 ENCOUNTER — Encounter: Payer: Self-pay | Admitting: Family Medicine

## 2018-04-29 DIAGNOSIS — Z1231 Encounter for screening mammogram for malignant neoplasm of breast: Secondary | ICD-10-CM

## 2018-05-04 ENCOUNTER — Other Ambulatory Visit: Payer: Self-pay

## 2018-05-04 ENCOUNTER — Encounter: Payer: Self-pay | Admitting: Family Medicine

## 2018-05-04 ENCOUNTER — Ambulatory Visit (INDEPENDENT_AMBULATORY_CARE_PROVIDER_SITE_OTHER): Payer: Medicare HMO | Admitting: Family Medicine

## 2018-05-04 VITALS — Temp 97.2°F | Ht 60.0 in | Wt 193.0 lb

## 2018-05-04 DIAGNOSIS — I10 Essential (primary) hypertension: Secondary | ICD-10-CM | POA: Diagnosis not present

## 2018-05-04 DIAGNOSIS — E039 Hypothyroidism, unspecified: Secondary | ICD-10-CM | POA: Diagnosis not present

## 2018-05-04 DIAGNOSIS — E782 Mixed hyperlipidemia: Secondary | ICD-10-CM

## 2018-05-04 DIAGNOSIS — N183 Chronic kidney disease, stage 3 (moderate): Secondary | ICD-10-CM

## 2018-05-04 DIAGNOSIS — E1122 Type 2 diabetes mellitus with diabetic chronic kidney disease: Secondary | ICD-10-CM

## 2018-05-04 NOTE — Progress Notes (Signed)
Virtual Visit via Telephone Note  I connected with Marissa Scott on 05/04/18 at  2:00 PM EDT by telephone and verified that I am speaking with the correct person using two identifiers.   I discussed the limitations, risks, security and privacy concerns of performing an evaluation and management service by telephone and the availability of in person appointments. I also discussed with the patient that there may be a patient responsible charge related to this service. The patient expressed understanding and agreed to proceed.  Pt is at home and I am in the office  History of Present Illness: HTN- chronic problem, on Amlodipine 5mg  daily, Coreg 12.5mg  BID, Lasix 20mg  daily.  No CP, SOB, HAs, visual changes, edema.    Hyperlipidemia- chronic problem, on Simvastatin 20mg  nightly.  Pt reports simvastatin raises sugars.  No abd pain, N/V.  DM- chronic problem, has not been following w/ Endo.  Overdue for A1C, foot exam, and eye exam.  On Actos 30mg  daily.  Follows w/ renal regularly- appt upcoming this month.  Rare symptomatic lows.  No numbness/tingling of hands/feet.    Hypothyroid- chronic problem, on Levothyroxine 170mcg daily.     Observations/Objective: Pt is able to speak clearly, coherently without shortness of breath or increased work of breathing. Thought process is linear.  Mood is appropriate.   Assessment and Plan: HTN- chronic problem.  No way to assess BP at this time but pt also follows w/ Renal.  Pt will come for labs and we will check BP at that time.  DM- chronic problem.  Pt has a hx of noncompliance and is again overdue for eye exam, foot exam, microalbumin.  On Actos 30mg  daily.  Admits to little to no exercise and not following a particular diet.  Stressed need for pt to take this seriously to prevent serious complications.  Check labs.  Adjust meds prn   Hyperlipidemia- chronic problem, on Simvastatin nightly w/o difficulty (although pt feels this raises her sugars).   Check labs.  Adjust meds prn   Hypothyroid- chronic problem.  Currently asymptomatic.  Check labs.  Adjust meds prn   Follow Up Instructions: Lab visit scheduled 3-4 months DM   I discussed the assessment and treatment plan with the patient. The patient was provided an opportunity to ask questions and all were answered. The patient agreed with the plan and demonstrated an understanding of the instructions.   The patient was advised to call back or seek an in-person evaluation if the symptoms worsen or if the condition fails to improve as anticipated.  I provided 12 minutes of non-face-to-face time during this encounter.   Annye Asa, MD

## 2018-05-04 NOTE — Progress Notes (Signed)
I have discussed the procedure for the virtual visit with the patient who has given consent to proceed with assessment and treatment.   BETHANY DILLARD, CMA     

## 2018-05-06 ENCOUNTER — Other Ambulatory Visit (INDEPENDENT_AMBULATORY_CARE_PROVIDER_SITE_OTHER): Payer: Medicare HMO

## 2018-05-06 ENCOUNTER — Telehealth: Payer: Self-pay

## 2018-05-06 VITALS — BP 138/80

## 2018-05-06 DIAGNOSIS — E1122 Type 2 diabetes mellitus with diabetic chronic kidney disease: Secondary | ICD-10-CM

## 2018-05-06 DIAGNOSIS — I10 Essential (primary) hypertension: Secondary | ICD-10-CM

## 2018-05-06 DIAGNOSIS — N183 Chronic kidney disease, stage 3 (moderate): Secondary | ICD-10-CM

## 2018-05-06 LAB — LIPID PANEL
Cholesterol: 222 mg/dL — ABNORMAL HIGH (ref 0–200)
HDL: 30.7 mg/dL — ABNORMAL LOW (ref >39.00)
NonHDL: 191.34
Total CHOL/HDL Ratio: 7
Triglycerides: 346 mg/dL — ABNORMAL HIGH (ref 0.0–149.0)
VLDL: 69.2 mg/dL — ABNORMAL HIGH (ref 0.0–40.0)

## 2018-05-06 LAB — CBC WITH DIFFERENTIAL/PLATELET
Basophils Absolute: 0.1 10*3/uL (ref 0.0–0.1)
Basophils Relative: 1.2 % (ref 0.0–3.0)
Eosinophils Absolute: 0.8 10*3/uL — ABNORMAL HIGH (ref 0.0–0.7)
Eosinophils Relative: 8.6 % — ABNORMAL HIGH (ref 0.0–5.0)
HCT: 34.7 % — ABNORMAL LOW (ref 36.0–46.0)
Hemoglobin: 11.2 g/dL — ABNORMAL LOW (ref 12.0–15.0)
Lymphocytes Relative: 18.8 % (ref 12.0–46.0)
Lymphs Abs: 1.7 10*3/uL (ref 0.7–4.0)
MCHC: 32.3 g/dL (ref 30.0–36.0)
MCV: 82.9 fl (ref 78.0–100.0)
Monocytes Absolute: 0.6 10*3/uL (ref 0.1–1.0)
Monocytes Relative: 6.8 % (ref 3.0–12.0)
Neutro Abs: 5.8 10*3/uL (ref 1.4–7.7)
Neutrophils Relative %: 64.6 % (ref 43.0–77.0)
Platelets: 279 10*3/uL (ref 150.0–400.0)
RBC: 4.19 Mil/uL (ref 3.87–5.11)
RDW: 15.8 % — ABNORMAL HIGH (ref 11.5–15.5)
WBC: 8.9 10*3/uL (ref 4.0–10.5)

## 2018-05-06 LAB — TSH: TSH: 4.16 u[IU]/mL (ref 0.35–4.50)

## 2018-05-06 LAB — BASIC METABOLIC PANEL
BUN: 39 mg/dL — ABNORMAL HIGH (ref 6–23)
CO2: 23 mEq/L (ref 19–32)
Calcium: 9.8 mg/dL (ref 8.4–10.5)
Chloride: 106 mEq/L (ref 96–112)
Creatinine, Ser: 2.18 mg/dL — ABNORMAL HIGH (ref 0.40–1.20)
GFR: 23.51 mL/min — ABNORMAL LOW (ref >60.00)
Glucose, Bld: 193 mg/dL — ABNORMAL HIGH (ref 70–99)
Potassium: 6.4 mEq/L (ref 3.5–5.1)
Sodium: 139 mEq/L (ref 135–145)

## 2018-05-06 LAB — HEPATIC FUNCTION PANEL
ALT: 10 U/L (ref 0–35)
AST: 12 U/L (ref 0–37)
Albumin: 3.8 g/dL (ref 3.5–5.2)
Alkaline Phosphatase: 80 U/L (ref 39–117)
Bilirubin, Direct: 0.1 mg/dL (ref 0.0–0.3)
Total Bilirubin: 0.3 mg/dL (ref 0.2–1.2)
Total Protein: 6.3 g/dL (ref 6.0–8.3)

## 2018-05-06 LAB — LDL CHOLESTEROL, DIRECT: Direct LDL: 114 mg/dL

## 2018-05-06 LAB — HEMOGLOBIN A1C: Hgb A1c MFr Bld: 8.9 % — ABNORMAL HIGH (ref 4.6–6.5)

## 2018-05-06 NOTE — Telephone Encounter (Signed)
Critical potassium-6.4 

## 2018-05-09 ENCOUNTER — Other Ambulatory Visit: Payer: Self-pay

## 2018-05-09 ENCOUNTER — Encounter: Payer: Self-pay | Admitting: Family Medicine

## 2018-05-09 ENCOUNTER — Other Ambulatory Visit: Payer: Self-pay | Admitting: Family Medicine

## 2018-05-09 DIAGNOSIS — N183 Chronic kidney disease, stage 3 (moderate): Principal | ICD-10-CM

## 2018-05-09 DIAGNOSIS — E1122 Type 2 diabetes mellitus with diabetic chronic kidney disease: Secondary | ICD-10-CM

## 2018-05-09 DIAGNOSIS — E875 Hyperkalemia: Secondary | ICD-10-CM

## 2018-05-11 ENCOUNTER — Other Ambulatory Visit (INDEPENDENT_AMBULATORY_CARE_PROVIDER_SITE_OTHER): Payer: Medicare HMO

## 2018-05-11 ENCOUNTER — Other Ambulatory Visit: Payer: Self-pay | Admitting: Family Medicine

## 2018-05-11 DIAGNOSIS — E875 Hyperkalemia: Secondary | ICD-10-CM | POA: Diagnosis not present

## 2018-05-11 LAB — BASIC METABOLIC PANEL
BUN: 46 mg/dL — ABNORMAL HIGH (ref 6–23)
CO2: 21 mEq/L (ref 19–32)
Calcium: 8.9 mg/dL (ref 8.4–10.5)
Chloride: 107 mEq/L (ref 96–112)
Creatinine, Ser: 2.08 mg/dL — ABNORMAL HIGH (ref 0.40–1.20)
GFR: 24.82 mL/min — ABNORMAL LOW (ref >60.00)
Glucose, Bld: 151 mg/dL — ABNORMAL HIGH (ref 70–99)
Potassium: 4.4 mEq/L (ref 3.5–5.1)
Sodium: 140 mEq/L (ref 135–145)

## 2018-05-11 MED ORDER — LEVOTHYROXINE SODIUM 100 MCG PO TABS: 100.0000 ug | ORAL_TABLET | Freq: Every day | ORAL | 1 refills | Status: DC

## 2018-05-11 MED ORDER — CARVEDILOL 12.5 MG PO TABS: ORAL_TABLET | ORAL | 1 refills | Status: DC

## 2018-05-11 MED ORDER — PIOGLITAZONE HCL 30 MG PO TABS: 30.0000 mg | ORAL_TABLET | Freq: Every day | ORAL | 1 refills | Status: DC

## 2018-05-11 MED ORDER — ROSUVASTATIN CALCIUM 10 MG PO TABS: 10.0000 mg | ORAL_TABLET | Freq: Every day | ORAL | 1 refills | Status: DC

## 2018-05-30 ENCOUNTER — Encounter: Payer: Self-pay | Admitting: Endocrinology

## 2018-05-31 ENCOUNTER — Encounter: Payer: Self-pay | Admitting: Endocrinology

## 2018-05-31 ENCOUNTER — Other Ambulatory Visit: Payer: Self-pay

## 2018-05-31 ENCOUNTER — Ambulatory Visit (INDEPENDENT_AMBULATORY_CARE_PROVIDER_SITE_OTHER): Payer: Medicare HMO | Admitting: Endocrinology

## 2018-05-31 DIAGNOSIS — N183 Chronic kidney disease, stage 3 unspecified: Secondary | ICD-10-CM

## 2018-05-31 DIAGNOSIS — E1122 Type 2 diabetes mellitus with diabetic chronic kidney disease: Secondary | ICD-10-CM

## 2018-05-31 MED ORDER — REPAGLINIDE 1 MG PO TABS: 1.0000 mg | ORAL_TABLET | Freq: Three times a day (TID) | ORAL | 3 refills | Status: DC

## 2018-05-31 NOTE — Telephone Encounter (Signed)
Please advise 

## 2018-05-31 NOTE — Progress Notes (Signed)
Subjective:    Patient ID: Marissa Scott, female    DOB: [birth year], 53 y.o.   MRN: [id number]  HPI  telehealth visit today via doxy video visit.  Alternatives to telehealth are presented to this patient, and the patient agrees to the telehealth visit. Pt is advised of the cost of the visit, and agrees to this, also.   Patient is at home, and I am at the office.   Persons attending the telehealth visit: the patient and I pt is referred by Dr Birdie Riddle,  for diabetes.  Pt states DM was dx'ed in 2000; she has mild if any neuropathy of the lower extremities, but she has associated renal insuff and DR; she has never been on insulin; pt says her diet and exercise are fair; she has never had GDM, pancreatitis, pancreatic surgery, severe hypoglycemia or DKA.  I last saw this pt in 2016.  She takes pioglitazone only.  She does not check cbg's.  She did not tolerate acarbose in the past.  Past Medical History:  Diagnosis Date  . Arthritis   . DIABETES MELLITUS, TYPE II 01/30/2008  . HYPERLIPIDEMIA 01/30/2008  . HYPERTENSION 01/30/2008  . HYPERURICEMIA 10/01/2008  . Polycystic ovaries 01/04/2009  . RENAL DISEASE, CHRONIC, STAGE III 01/30/2008   Follows w/ Renal    Past Surgical History:  Procedure Laterality Date  . APPENDECTOMY    . LUMBAR LAMINECTOMY     L4-L5  . NEPHRECTOMY     left removed, partial right-Ottelin  . Neural Stimulator    . Right hand cyst    . TUBAL LIGATION     GYN [facility]    Social History   Socioeconomic History  . Marital status: Married    Spouse name: Not on file  . Number of children: Not on file  . Years of education: Not on file  . Highest education level: Not on file  Occupational History  . Occupation: Disabled  Social Needs  . Financial resource strain: Not on file  . Food insecurity:    Worry: Not on file    Inability: Not on file  . Transportation needs:    Medical: Not on file    Non-medical: Not on file  Tobacco Use  . Smoking status: Never  Smoker  . Smokeless tobacco: Never Used  Substance and Sexual Activity  . Alcohol use: Yes    Comment: rarely  . Drug use: No  . Sexual activity: Yes  Lifestyle  . Physical activity:    Days per week: Not on file    Minutes per session: Not on file  . Stress: Not on file  Relationships  . Social connections:    Talks on phone: Not on file    Gets together: Not on file    Attends religious service: Not on file    Active member of club or organization: Not on file    Attends meetings of clubs or organizations: Not on file    Relationship status: Not on file  . Intimate partner violence:    Fear of current or ex partner: Not on file    Emotionally abused: Not on file    Physically abused: Not on file    Forced sexual activity: Not on file  Other Topics Concern  . Not on file  Social History Narrative   Disabled for back pain    Current Outpatient Medications on File Prior to Visit  Medication Sig Dispense Refill  . acetaminophen (TYLENOL) 650 MG CR  tablet Take by mouth.    Marland Kitchen allopurinol (ZYLOPRIM) 100 MG tablet     . amLODipine (NORVASC) 5 MG tablet TAKE 1 TABLET EVERY DAY 90 tablet 1  . carvedilol (COREG) 12.5 MG tablet TAKE 1 TABLET TWICE DAILY WITH A MEAL 180 tablet 1  . COLCRYS 0.6 MG tablet Take 0.6 mg by mouth daily.  0  . fluticasone (FLONASE) 50 MCG/ACT nasal spray Place 2 sprays into both nostrils daily. 16 g 6  . furosemide (LASIX) 20 MG tablet Take by mouth as needed.     . gabapentin (NEURONTIN) 300 MG capsule Take 300 mg by mouth at bedtime.     Marland Kitchen HYDROmorphone (DILAUDID) 2 MG tablet Take 1 tablet by mouth every 6 (six) hours as needed for moderate pain.     Marland Kitchen leflunomide (ARAVA) 10 MG tablet Take 10 mg by mouth daily.  11  . levothyroxine (SYNTHROID, LEVOTHROID) 100 MCG tablet TAKE 1 TABLET (100 MCG TOTAL) DAILY. 90 tablet 1  . Multiple Vitamin (MULTI-VITAMINS) TABS Take 1 tablet by mouth daily.    Marland Kitchen oxyCODONE-acetaminophen (PERCOCET/ROXICET) 5-325 MG tablet      . pioglitazone (ACTOS) 30 MG tablet TAKE 1 TABLET EVERY DAY 90 tablet 1  . rosuvastatin (CRESTOR) 10 MG tablet Take 1 tablet (10 mg total) by mouth at bedtime. 90 tablet 1   No current facility-administered medications on file prior to visit.     Allergies  Allergen Reactions  . Enalapril Maleate   . Pioglitazone     arthralgias  . Penicillins Rash    Family History  Problem Relation Age of Onset  . Hypertension Father   . Heart disease Father        CAD  . Diabetes Father   . Stroke Father   . Colon cancer Father 66  . Colon cancer Cousin 42       Lynch Syndrome  . Stomach cancer Cousin 63  . Lung cancer Maternal Aunt 60       history of smoking  . Esophageal cancer Neg Hx   . Rectal cancer Neg Hx     LMP 10/27/2014    Review of Systems denies blurry vision, headache, chest pain, sob, n/v, urinary frequency, muscle cramps, leg edema, excessive diaphoresis, memory loss, depression, cold intolerance, and rhinorrhea.  She has weight gain and easy bruising.      Objective:   Physical Exam    Lab Results  Component Value Date   HGBA1C 8.9 (H) 05/06/2018   Lab Results  Component Value Date   CREATININE 2.08 (H) 05/11/2018   BUN 46 (H) 05/11/2018   NA 140 05/11/2018   K 4.4 05/11/2018   CL 107 05/11/2018   CO2 21 05/11/2018   Lab Results  Component Value Date   TSH 4.16 05/06/2018   I have reviewed outside records, and summarized: Pt was noted to have elevated a1c, and referred here.  Other probs addressed were hypothyroidism, dyslipidemia, and HTN     Assessment & Plan:  Type 2 DM, with DR: worse.  We discussed.  she declines brand name meds.   Renal insuff: This limits rx options.  Patient Instructions  I have sent a prescription to your pharmacy, to add repaglinide.  Please continue the same pioglitazone.   check your blood sugar once a day.  vary the time of day when you check, between before the 3 meals, and at bedtime.  also check if you have  symptoms of your blood sugar being  too high or too low.  please keep a record of the readings and bring it to your next appointment here (or you can bring the meter itself).  You can write it on any piece of paper.  please call us sooner if your blood sugar goes below 70, or if you have a lot of readings over 200. Please come back for a follow-up appointment in 2 months.

## 2018-05-31 NOTE — Patient Instructions (Signed)
I have sent a prescription to your pharmacy, to add repaglinide.  Please continue the same pioglitazone.   check your blood sugar once a day.  vary the time of day when you check, between before the 3 meals, and at bedtime.  also check if you have symptoms of your blood sugar being too high or too low.  please keep a record of the readings and bring it to your next appointment here (or you can bring the meter itself).  You can write it on any piece of paper.  please call us sooner if your blood sugar goes below 70, or if you have a lot of readings over 200. Please come back for a follow-up appointment in 2 months.

## 2018-06-01 ENCOUNTER — Other Ambulatory Visit: Payer: Self-pay | Admitting: Endocrinology

## 2018-06-01 DIAGNOSIS — E669 Obesity, unspecified: Secondary | ICD-10-CM | POA: Diagnosis not present

## 2018-06-01 DIAGNOSIS — I129 Hypertensive chronic kidney disease with stage 1 through stage 4 chronic kidney disease, or unspecified chronic kidney disease: Secondary | ICD-10-CM | POA: Diagnosis not present

## 2018-06-01 DIAGNOSIS — E1122 Type 2 diabetes mellitus with diabetic chronic kidney disease: Secondary | ICD-10-CM | POA: Diagnosis not present

## 2018-06-01 DIAGNOSIS — D649 Anemia, unspecified: Secondary | ICD-10-CM | POA: Diagnosis not present

## 2018-06-01 DIAGNOSIS — L4050 Arthropathic psoriasis, unspecified: Secondary | ICD-10-CM | POA: Diagnosis not present

## 2018-06-01 DIAGNOSIS — M109 Gout, unspecified: Secondary | ICD-10-CM | POA: Diagnosis not present

## 2018-06-01 DIAGNOSIS — N183 Chronic kidney disease, stage 3 (moderate): Secondary | ICD-10-CM | POA: Diagnosis not present

## 2018-06-01 MED ORDER — REPAGLINIDE 1 MG PO TABS: 1.0000 mg | ORAL_TABLET | Freq: Three times a day (TID) | ORAL | 3 refills | Status: DC

## 2018-06-01 NOTE — Telephone Encounter (Signed)
Please advise 

## 2018-06-14 ENCOUNTER — Ambulatory Visit
Admission: RE | Admit: 2018-06-14 | Discharge: 2018-06-14 | Disposition: A | Discharge status: Home / Self Care | Payer: Medicare HMO | Source: Ambulatory Visit | Attending: Obstetrics & Gynecology | Admitting: Obstetrics & Gynecology

## 2018-06-14 ENCOUNTER — Other Ambulatory Visit: Payer: Self-pay

## 2018-06-14 DIAGNOSIS — Z1231 Encounter for screening mammogram for malignant neoplasm of breast: Secondary | ICD-10-CM

## 2018-06-21 ENCOUNTER — Ambulatory Visit: Payer: Medicare HMO

## 2018-06-28 DIAGNOSIS — E119 Type 2 diabetes mellitus without complications: Secondary | ICD-10-CM | POA: Diagnosis not present

## 2018-06-28 DIAGNOSIS — M47816 Spondylosis without myelopathy or radiculopathy, lumbar region: Secondary | ICD-10-CM | POA: Insufficient documentation

## 2018-06-28 DIAGNOSIS — F1120 Opioid dependence, uncomplicated: Secondary | ICD-10-CM | POA: Insufficient documentation

## 2018-06-28 DIAGNOSIS — M47812 Spondylosis without myelopathy or radiculopathy, cervical region: Secondary | ICD-10-CM | POA: Diagnosis not present

## 2018-06-28 DIAGNOSIS — Z79899 Other long term (current) drug therapy: Secondary | ICD-10-CM | POA: Diagnosis not present

## 2018-06-28 DIAGNOSIS — M961 Postlaminectomy syndrome, not elsewhere classified: Secondary | ICD-10-CM | POA: Diagnosis not present

## 2018-07-29 DIAGNOSIS — M47816 Spondylosis without myelopathy or radiculopathy, lumbar region: Secondary | ICD-10-CM | POA: Diagnosis not present

## 2018-07-29 DIAGNOSIS — M546 Pain in thoracic spine: Secondary | ICD-10-CM | POA: Insufficient documentation

## 2018-07-29 DIAGNOSIS — F1120 Opioid dependence, uncomplicated: Secondary | ICD-10-CM | POA: Diagnosis not present

## 2018-07-29 DIAGNOSIS — Z79899 Other long term (current) drug therapy: Secondary | ICD-10-CM | POA: Diagnosis not present

## 2018-07-29 DIAGNOSIS — M961 Postlaminectomy syndrome, not elsewhere classified: Secondary | ICD-10-CM | POA: Diagnosis not present

## 2018-07-29 DIAGNOSIS — M47812 Spondylosis without myelopathy or radiculopathy, cervical region: Secondary | ICD-10-CM | POA: Diagnosis not present

## 2018-08-03 ENCOUNTER — Ambulatory Visit: Payer: Medicare HMO | Admitting: Endocrinology

## 2018-08-22 ENCOUNTER — Encounter: Payer: Self-pay | Admitting: Family Medicine

## 2018-08-24 ENCOUNTER — Ambulatory Visit: Payer: Medicare HMO | Admitting: Family Medicine

## 2018-08-24 ENCOUNTER — Ambulatory Visit (INDEPENDENT_AMBULATORY_CARE_PROVIDER_SITE_OTHER): Payer: Medicare HMO | Admitting: Family Medicine

## 2018-08-24 ENCOUNTER — Other Ambulatory Visit: Payer: Self-pay

## 2018-08-24 ENCOUNTER — Encounter: Payer: Self-pay | Admitting: Family Medicine

## 2018-08-24 VITALS — BP 130/78 | Temp 97.0°F | Ht 60.0 in | Wt 190.0 lb

## 2018-08-24 DIAGNOSIS — E785 Hyperlipidemia, unspecified: Secondary | ICD-10-CM | POA: Diagnosis not present

## 2018-08-24 DIAGNOSIS — E1169 Type 2 diabetes mellitus with other specified complication: Secondary | ICD-10-CM | POA: Diagnosis not present

## 2018-08-24 DIAGNOSIS — E1122 Type 2 diabetes mellitus with diabetic chronic kidney disease: Secondary | ICD-10-CM | POA: Diagnosis not present

## 2018-08-24 DIAGNOSIS — I129 Hypertensive chronic kidney disease with stage 1 through stage 4 chronic kidney disease, or unspecified chronic kidney disease: Secondary | ICD-10-CM

## 2018-08-24 DIAGNOSIS — I10 Essential (primary) hypertension: Secondary | ICD-10-CM

## 2018-08-24 DIAGNOSIS — N183 Chronic kidney disease, stage 3 unspecified: Secondary | ICD-10-CM

## 2018-08-24 NOTE — Progress Notes (Signed)
I have discussed the procedure for the virtual visit with the patient who has given consent to proceed with assessment and treatment.   Jessica L Brodmerkel, CMA     

## 2018-08-24 NOTE — Progress Notes (Signed)
Virtual Visit via Video   I connected with patient on 08/24/18 at 11:00 AM EDT by a video enabled telemedicine application and verified that I am speaking with the correct person using two identifiers.  Location patient: Home Location provider: Acupuncturist, Office Persons participating in the virtual visit: Patient, Provider, [place] (Jess B)  I discussed the limitations of evaluation and management by telemedicine and the availability of in person appointments. The patient expressed understanding and agreed to proceed.  Subjective:   HPI:   DM- was referred to Dr Loanne Drilling after last A1C was 8.9.  CBG this AM 126.  No recent eye exam.  Obesity- pt is down 3 lbs since last visit.  No regular exercise.  Will occasionally 'walk around'.  Hyperlipidemia- chronic problem, at last visit Simvastatin was switched to Crestor 10mg .  No abd pain, N/V.  HTN- chronic problem, on Coreg 12.5mg  BID and Amlodipine 5mg  daily w/ adequate control.  No CP, SOB, HAs, visual changes, edema.  ROS:   See pertinent positives and negatives per HPI.  Patient Active Problem List   Diagnosis Date Noted  . Allergic rhinitis 10/25/2017  . Genetic testing 05/20/2016  . Ganglion cyst 11/26/2015  . Anemia, iron deficiency 07/30/2015  . Hypothyroidism 11/06/2014  . Injury of coccyx 10/17/2014  . Back pain 10/17/2014  . Physical exam 05/07/2014  . Polyarthralgia 11/30/2013  . Morbid obesity ([place]) 09/22/2012  . Blood blister 08/22/2012  . Type 2 diabetes mellitus with stage 3 chronic kidney disease, without long-term current use of insulin ([place]) 06/02/2011  . POLYCYSTIC OVARIES 01/04/2009  . Hyperuricemia 10/01/2008  . WRIST PAIN, LEFT 06/06/2008  . DERMATITIS, SCALP 03/13/2008  . Hyperlipidemia 01/30/2008  . Essential hypertension 01/30/2008  . RENAL DISEASE, CHRONIC, STAGE III 01/30/2008  . LEG CRAMPS 01/30/2008    Social History   Tobacco Use  . Smoking status: Never Smoker  . Smokeless  tobacco: Never Used  Substance Use Topics  . Alcohol use: Yes    Comment: rarely    Current Outpatient Medications:  .  acetaminophen (TYLENOL) 650 MG CR tablet, Take by mouth., Disp: , Rfl:  .  allopurinol (ZYLOPRIM) 100 MG tablet, , Disp: , Rfl:  .  amLODipine (NORVASC) 5 MG tablet, TAKE 1 TABLET EVERY DAY, Disp: 90 tablet, Rfl: 1 .  carvedilol (COREG) 12.5 MG tablet, TAKE 1 TABLET TWICE DAILY WITH A MEAL, Disp: 180 tablet, Rfl: 1 .  COLCRYS 0.6 MG tablet, Take 0.6 mg by mouth daily., Disp: , Rfl: 0 .  cyclobenzaprine (FLEXERIL) 10 MG tablet, TAKE 1 TABLET BY MOUTH EVERY 8 HOURS AS NEEDED FOR SPASMS, Disp: , Rfl:  .  fluticasone (FLONASE) 50 MCG/ACT nasal spray, Place 2 sprays into both nostrils daily., Disp: 16 g, Rfl: 6 .  furosemide (LASIX) 20 MG tablet, Take by mouth as needed. , Disp: , Rfl:  .  gabapentin (NEURONTIN) 300 MG capsule, Take 300 mg by mouth at bedtime. , Disp: , Rfl:  .  HYDROmorphone (DILAUDID) 2 MG tablet, Take 1 tablet by mouth every 6 (six) hours as needed for moderate pain. , Disp: , Rfl:  .  leflunomide (ARAVA) 10 MG tablet, Take 10 mg by mouth daily., Disp: , Rfl: 11 .  levothyroxine (SYNTHROID, LEVOTHROID) 100 MCG tablet, TAKE 1 TABLET (100 MCG TOTAL) DAILY., Disp: 90 tablet, Rfl: 1 .  Multiple Vitamin (MULTI-VITAMINS) TABS, Take 1 tablet by mouth daily., Disp: , Rfl:  .  oxyCODONE-acetaminophen (PERCOCET/ROXICET) 5-325 MG tablet, , Disp: ,  Rfl:  .  pioglitazone (ACTOS) 30 MG tablet, TAKE 1 TABLET EVERY DAY, Disp: 90 tablet, Rfl: 1 .  repaglinide (PRANDIN) 1 MG tablet, Take 1 tablet (1 mg total) by mouth 3 (three) times daily before meals., Disp: 270 tablet, Rfl: 3 .  rosuvastatin (CRESTOR) 10 MG tablet, Take 1 tablet (10 mg total) by mouth at bedtime., Disp: 90 tablet, Rfl: 1 .  traMADol (ULTRAM) 50 MG tablet, TAKE 1 TABLET BY MOUTH EVERY 24 HOURS AS NEEDED, Disp: , Rfl:   Allergies  Allergen Reactions  . Enalapril Maleate   . Pioglitazone     arthralgias   . Penicillins Rash    Objective:   BP 130/78   Temp (!) 97 F (36.1 C) (Oral)   Ht 5' (1.524 m)   Wt 190 lb (86.2 kg)   LMP 10/27/2014   BMI 37.11 kg/m  AAOx3, NAD NCAT, EOMI No obvious CN deficits Coloring WNL Pt is able to speak clearly, coherently without shortness of breath or increased work of breathing.  Thought process is linear.  Mood is appropriate.   Assessment and Plan:   HTN- chronic problem.  Adequate control today.  Asymptomatic.  Check labs.  No anticipated med changes.  Will follow.  Hyperlipidemia- chronic problem.  Currently on Crestor nightly.  Again encouraged healthy diet and regular exercise.  Check labs.  Adjust meds prn   Obesity- ongoing issue for pt.  She is down 3 lbs.  Stressed need for healthy diet and regular exercise.  Will continue to follow.  DM- pt was referred to Endo for ongoing management but needs updated A1C and microalbumin.  Ordered.   Annye Asa, MD 08/24/2018

## 2018-08-30 ENCOUNTER — Encounter: Payer: Self-pay | Admitting: *Deleted

## 2018-10-17 ENCOUNTER — Other Ambulatory Visit: Payer: Self-pay | Admitting: Family Medicine

## 2018-11-28 ENCOUNTER — Other Ambulatory Visit: Payer: Self-pay | Admitting: Family Medicine

## 2018-11-28 DIAGNOSIS — I129 Hypertensive chronic kidney disease with stage 1 through stage 4 chronic kidney disease, or unspecified chronic kidney disease: Secondary | ICD-10-CM | POA: Diagnosis not present

## 2018-11-28 DIAGNOSIS — D649 Anemia, unspecified: Secondary | ICD-10-CM | POA: Diagnosis not present

## 2018-11-28 DIAGNOSIS — N183 Chronic kidney disease, stage 3 unspecified: Secondary | ICD-10-CM | POA: Diagnosis not present

## 2018-11-28 DIAGNOSIS — E1122 Type 2 diabetes mellitus with diabetic chronic kidney disease: Secondary | ICD-10-CM | POA: Diagnosis not present

## 2018-11-28 DIAGNOSIS — L4050 Arthropathic psoriasis, unspecified: Secondary | ICD-10-CM | POA: Diagnosis not present

## 2018-11-28 DIAGNOSIS — M109 Gout, unspecified: Secondary | ICD-10-CM | POA: Diagnosis not present

## 2019-05-01 ENCOUNTER — Other Ambulatory Visit: Payer: Self-pay | Admitting: Family Medicine

## 2019-05-11 ENCOUNTER — Telehealth (INDEPENDENT_AMBULATORY_CARE_PROVIDER_SITE_OTHER): Payer: Medicare HMO | Admitting: Family Medicine

## 2019-05-11 ENCOUNTER — Encounter: Payer: Self-pay | Admitting: Internal Medicine

## 2019-05-11 ENCOUNTER — Encounter: Payer: Self-pay | Admitting: Family Medicine

## 2019-05-11 ENCOUNTER — Other Ambulatory Visit: Payer: Self-pay

## 2019-05-11 DIAGNOSIS — E039 Hypothyroidism, unspecified: Secondary | ICD-10-CM | POA: Diagnosis not present

## 2019-05-11 DIAGNOSIS — E785 Hyperlipidemia, unspecified: Secondary | ICD-10-CM

## 2019-05-11 DIAGNOSIS — R21 Rash and other nonspecific skin eruption: Secondary | ICD-10-CM | POA: Diagnosis not present

## 2019-05-11 DIAGNOSIS — E1169 Type 2 diabetes mellitus with other specified complication: Secondary | ICD-10-CM | POA: Diagnosis not present

## 2019-05-11 DIAGNOSIS — R1310 Dysphagia, unspecified: Secondary | ICD-10-CM | POA: Diagnosis not present

## 2019-05-11 DIAGNOSIS — N183 Chronic kidney disease, stage 3 unspecified: Secondary | ICD-10-CM

## 2019-05-11 DIAGNOSIS — E1122 Type 2 diabetes mellitus with diabetic chronic kidney disease: Secondary | ICD-10-CM | POA: Diagnosis not present

## 2019-05-11 MED ORDER — TRIAMCINOLONE ACETONIDE 0.1 % EX OINT: 1.0000 "application " | TOPICAL_OINTMENT | Freq: Two times a day (BID) | CUTANEOUS | 1 refills | Status: DC

## 2019-05-11 NOTE — Progress Notes (Signed)
I have discussed the procedure for the virtual visit with the patient who has given consent to proceed with assessment and treatment.   Pt unable to obtain vitals.   Nehal Shives L Jaydrian Corpening, CMA     

## 2019-05-11 NOTE — Progress Notes (Signed)
Virtual Visit via Video   I connected with patient on 05/11/19 at 11:30 AM EDT by a video enabled telemedicine application and verified that I am speaking with the correct person using two identifiers.  Location patient: Home Location provider: Acupuncturist, Office Persons participating in the virtual visit: Patient, Provider, [place] (Jess B)  I discussed the limitations of evaluation and management by telemedicine and the availability of in person appointments. The patient expressed understanding and agreed to proceed.  Subjective:   HPI:   Itching/Rash- sxs started ~1 yr ago w/ 'little whelps- like someone splattered purple paint on my torso'.  She now has sxs on torso, legs.  'they itch like crazy'.  Area appears dry, will have maculopapular 'bumps'.  Using Hydrocortisone without relief.  No new or different medications recently.  Dysphagia- pt has had intermittent dysphagia 'for years' but 'it has been no big deal'.  A few weeks ago she had an episode 'it felt like a squeezing and you need to swallow but you can't swallow.  I blacked out'.  Has only blacked out that 1 time but has had choking episodes before.  Pt reports it feels like a spasm and then that 'will relax and then I'm fine'.  No known trigger.  Feeling will last 'a minute or 2'.  'it's like something is squeezing'.  Pt has seen Dr Henrene Pastor previously.  Hypothyroid- 'i'm cold all the time and my hair is falling out in clumps'.  DM- chronic problem, supposed to be following w/ Endocrinology but has not been seen recently.  Overdue for eye exam, foot exam.  ROS:   See pertinent positives and negatives per HPI.  Patient Active Problem List   Diagnosis Date Noted  . Allergic rhinitis 10/25/2017  . Genetic testing 05/20/2016  . Ganglion cyst 11/26/2015  . Anemia, iron deficiency 07/30/2015  . Hypothyroidism 11/06/2014  . Injury of coccyx 10/17/2014  . Back pain 10/17/2014  . Physical exam 05/07/2014  .  Polyarthralgia 11/30/2013  . Morbid obesity ([place]) 09/22/2012  . Blood blister 08/22/2012  . Type 2 diabetes mellitus with stage 3 chronic kidney disease, without long-term current use of insulin ([place]) 06/02/2011  . POLYCYSTIC OVARIES 01/04/2009  . Hyperuricemia 10/01/2008  . WRIST PAIN, LEFT 06/06/2008  . DERMATITIS, SCALP 03/13/2008  . Hyperlipidemia associated with type 2 diabetes mellitus ([place]) 01/30/2008  . Essential hypertension 01/30/2008  . RENAL DISEASE, CHRONIC, STAGE III 01/30/2008  . LEG CRAMPS 01/30/2008    Social History   Tobacco Use  . Smoking status: Never Smoker  . Smokeless tobacco: Never Used  Substance Use Topics  . Alcohol use: Yes    Comment: rarely    Current Outpatient Medications:  .  acetaminophen (TYLENOL) 650 MG CR tablet, Take by mouth., Disp: , Rfl:  .  allopurinol (ZYLOPRIM) 100 MG tablet, , Disp: , Rfl:  .  amLODipine (NORVASC) 5 MG tablet, TAKE 1 TABLET EVERY DAY, Disp: 90 tablet, Rfl: 1 .  carvedilol (COREG) 12.5 MG tablet, TAKE 1 TABLET TWICE DAILY WITH  MEALS, Disp: 180 tablet, Rfl: 1 .  COLCRYS 0.6 MG tablet, Take 0.6 mg by mouth daily., Disp: , Rfl: 0 .  cyclobenzaprine (FLEXERIL) 10 MG tablet, TAKE 1 TABLET BY MOUTH EVERY 8 HOURS AS NEEDED FOR SPASMS, Disp: , Rfl:  .  fluticasone (FLONASE) 50 MCG/ACT nasal spray, Place 2 sprays into both nostrils daily., Disp: 16 g, Rfl: 6 .  furosemide (LASIX) 20 MG tablet, Take by mouth as needed. ,  Disp: , Rfl:  .  gabapentin (NEURONTIN) 100 MG capsule, , Disp: , Rfl:  .  HYDROmorphone (DILAUDID) 2 MG tablet, Take 1 tablet by mouth every 6 (six) hours as needed for moderate pain. , Disp: , Rfl:  .  leflunomide (ARAVA) 10 MG tablet, Take 10 mg by mouth daily., Disp: , Rfl: 11 .  levothyroxine (SYNTHROID) 100 MCG tablet, TAKE 1 TABLET EVERY DAY, Disp: 90 tablet, Rfl: 1 .  Multiple Vitamin (MULTI-VITAMINS) TABS, Take 1 tablet by mouth daily., Disp: , Rfl:  .  oxyCODONE-acetaminophen (PERCOCET/ROXICET) 5-325  MG tablet, , Disp: , Rfl:  .  pioglitazone (ACTOS) 30 MG tablet, TAKE 1 TABLET EVERY DAY, Disp: 90 tablet, Rfl: 1 .  repaglinide (PRANDIN) 1 MG tablet, Take 1 tablet (1 mg total) by mouth 3 (three) times daily before meals., Disp: 270 tablet, Rfl: 3 .  rosuvastatin (CRESTOR) 10 MG tablet, TAKE 1 TABLET (10 MG TOTAL) BY MOUTH AT BEDTIME., Disp: 90 tablet, Rfl: 1 .  traMADol (ULTRAM) 50 MG tablet, TAKE 1 TABLET BY MOUTH EVERY 24 HOURS AS NEEDED, Disp: , Rfl:   Allergies  Allergen Reactions  . Enalapril Maleate   . Pioglitazone     arthralgias  . Penicillins Rash    Objective:   LMP 10/27/2014  AAOx3, NAD NCAT, EOMI No obvious CN deficits Coloring WNL Pt is able to speak clearly, coherently without shortness of breath or increased work of breathing.  Thought process is linear.  Mood is appropriate.  Patchy, dry skin w/ maculopapular areas and excoriations  Assessment and Plan:   Rash- unclear as to cause.  Started ~1 yr ago.  Very itchy.  No new or different medication.  She does have lupus and a dermatologist but she has not reached out.  Will start topical triamcinolone and if no improvement she will need to see Derm.  Pt expressed understanding and is in agreement w/ plan.   Dysphagia- new.  Pt reports she will have 'squeezing' episodes where she loses the ability to swallow.  These can be very scary and she did black out x1.  Reports sxs will last a few minutes and resolve spontaneously- making allergic rxn less likely.  She is potentially having severe esophageal spasms and will need a GI workup.  Referral placed.  Hypothyroid- pt is again symptomatic.  Check labs.  Adjust meds prn   DM- pt has not followed up w/ Endo as suggested.  She is overdue for A1C, eye exam, foot exam.  Again discussed importance of compliance.  Check labs and send back to endo for f/u.   Annye Asa, MD 05/11/2019

## 2019-05-12 ENCOUNTER — Encounter: Payer: Self-pay | Admitting: Family Medicine

## 2019-05-12 ENCOUNTER — Other Ambulatory Visit (INDEPENDENT_AMBULATORY_CARE_PROVIDER_SITE_OTHER): Payer: Medicare HMO

## 2019-05-12 DIAGNOSIS — E039 Hypothyroidism, unspecified: Secondary | ICD-10-CM | POA: Diagnosis not present

## 2019-05-12 DIAGNOSIS — E1122 Type 2 diabetes mellitus with diabetic chronic kidney disease: Secondary | ICD-10-CM

## 2019-05-12 DIAGNOSIS — E1169 Type 2 diabetes mellitus with other specified complication: Secondary | ICD-10-CM

## 2019-05-12 DIAGNOSIS — E785 Hyperlipidemia, unspecified: Secondary | ICD-10-CM | POA: Diagnosis not present

## 2019-05-12 DIAGNOSIS — N183 Chronic kidney disease, stage 3 unspecified: Secondary | ICD-10-CM

## 2019-05-12 LAB — CBC WITH DIFFERENTIAL/PLATELET
Basophils Absolute: 0.2 10*3/uL — ABNORMAL HIGH (ref 0.0–0.1)
Basophils Relative: 2 % (ref 0.0–3.0)
Eosinophils Absolute: 1 10*3/uL — ABNORMAL HIGH (ref 0.0–0.7)
Eosinophils Relative: 11.1 % — ABNORMAL HIGH (ref 0.0–5.0)
HCT: 30.9 % — ABNORMAL LOW (ref 36.0–46.0)
Hemoglobin: 9.8 g/dL — ABNORMAL LOW (ref 12.0–15.0)
Lymphocytes Relative: 18.1 % (ref 12.0–46.0)
Lymphs Abs: 1.7 10*3/uL (ref 0.7–4.0)
MCHC: 31.8 g/dL (ref 30.0–36.0)
MCV: 84.2 fl (ref 78.0–100.0)
Monocytes Absolute: 0.5 10*3/uL (ref 0.1–1.0)
Monocytes Relative: 4.9 % (ref 3.0–12.0)
Neutro Abs: 6 10*3/uL (ref 1.4–7.7)
Neutrophils Relative %: 63.9 % (ref 43.0–77.0)
Platelets: 346 10*3/uL (ref 150.0–400.0)
RBC: 3.67 Mil/uL — ABNORMAL LOW (ref 3.87–5.11)
RDW: 16.7 % — ABNORMAL HIGH (ref 11.5–15.5)
WBC: 9.3 10*3/uL (ref 4.0–10.5)

## 2019-05-12 LAB — BASIC METABOLIC PANEL
BUN: 44 mg/dL — ABNORMAL HIGH (ref 6–23)
CO2: 20 mEq/L (ref 19–32)
Calcium: 9.9 mg/dL (ref 8.4–10.5)
Chloride: 109 mEq/L (ref 96–112)
Creatinine, Ser: 2.3 mg/dL — ABNORMAL HIGH (ref 0.40–1.20)
GFR: 22.02 mL/min — ABNORMAL LOW (ref >60.00)
Glucose, Bld: 151 mg/dL — ABNORMAL HIGH (ref 70–99)
Potassium: 4.3 mEq/L (ref 3.5–5.1)
Sodium: 139 mEq/L (ref 135–145)

## 2019-05-12 LAB — LIPID PANEL
Cholesterol: 188 mg/dL (ref 0–200)
HDL: 28.8 mg/dL — ABNORMAL LOW (ref >39.00)
NonHDL: 159.2
Total CHOL/HDL Ratio: 7
Triglycerides: 273 mg/dL — ABNORMAL HIGH (ref 0.0–149.0)
VLDL: 54.6 mg/dL — ABNORMAL HIGH (ref 0.0–40.0)

## 2019-05-12 LAB — HEPATIC FUNCTION PANEL
ALT: 7 U/L (ref 0–35)
AST: 10 U/L (ref 0–37)
Albumin: 4 g/dL (ref 3.5–5.2)
Alkaline Phosphatase: 86 U/L (ref 39–117)
Bilirubin, Direct: 0.1 mg/dL (ref 0.0–0.3)
Total Bilirubin: 0.3 mg/dL (ref 0.2–1.2)
Total Protein: 7.2 g/dL (ref 6.0–8.3)

## 2019-05-12 LAB — LDL CHOLESTEROL, DIRECT: Direct LDL: 98 mg/dL

## 2019-05-12 LAB — TSH: TSH: 5.02 u[IU]/mL — ABNORMAL HIGH (ref 0.35–4.50)

## 2019-05-12 LAB — HEMOGLOBIN A1C: Hgb A1c MFr Bld: 6.9 % — ABNORMAL HIGH (ref 4.6–6.5)

## 2019-05-16 ENCOUNTER — Telehealth: Payer: Self-pay | Admitting: Family Medicine

## 2019-05-16 ENCOUNTER — Other Ambulatory Visit: Payer: Self-pay | Admitting: General Practice

## 2019-05-16 DIAGNOSIS — E039 Hypothyroidism, unspecified: Secondary | ICD-10-CM

## 2019-05-16 MED ORDER — LEVOTHYROXINE SODIUM 112 MCG PO TABS: 112.0000 ug | ORAL_TABLET | Freq: Every day | ORAL | 1 refills | Status: DC

## 2019-05-16 NOTE — Progress Notes (Signed)
  Chronic Care Management   Outreach Note  05/16/2019 Name: Marissa Scott MRN: [id number] DOB: 1965-06-30  Referred by: Midge Minium, MD Reason for referral : No chief complaint on file.   An unsuccessful telephone outreach was attempted today. The patient was referred to the pharmacist for assistance with care management and care coordination.   Follow Up Plan:   Earney Hamburg Upstream Scheduler

## 2019-05-17 ENCOUNTER — Other Ambulatory Visit: Payer: Self-pay | Admitting: General Practice

## 2019-05-17 MED ORDER — TRIAMCINOLONE ACETONIDE 0.1 % EX CREA: 1.0000 "application " | TOPICAL_CREAM | Freq: Two times a day (BID) | CUTANEOUS | 3 refills | Status: DC

## 2019-05-18 DIAGNOSIS — I10 Essential (primary) hypertension: Secondary | ICD-10-CM | POA: Diagnosis not present

## 2019-05-18 DIAGNOSIS — R21 Rash and other nonspecific skin eruption: Secondary | ICD-10-CM | POA: Diagnosis not present

## 2019-05-18 DIAGNOSIS — E039 Hypothyroidism, unspecified: Secondary | ICD-10-CM | POA: Diagnosis not present

## 2019-05-18 DIAGNOSIS — M109 Gout, unspecified: Secondary | ICD-10-CM | POA: Diagnosis not present

## 2019-05-18 DIAGNOSIS — D649 Anemia, unspecified: Secondary | ICD-10-CM | POA: Diagnosis not present

## 2019-05-18 DIAGNOSIS — E119 Type 2 diabetes mellitus without complications: Secondary | ICD-10-CM | POA: Diagnosis not present

## 2019-05-18 DIAGNOSIS — N184 Chronic kidney disease, stage 4 (severe): Secondary | ICD-10-CM | POA: Diagnosis not present

## 2019-05-26 ENCOUNTER — Telehealth: Payer: Self-pay | Admitting: Family Medicine

## 2019-05-26 NOTE — Progress Notes (Signed)
  Chronic Care Management   Note  05/26/2019 Name: Marissa Scott MRN: [id number] DOB: [birth year]  Marissa Scott is a 53 y.o. year old female who is a primary care patient of Birdie Riddle, Aundra Millet, MD. I reached out to [place] by phone today in response to a referral sent by Marissa Scott's PCP, Midge Minium, MD.   Marissa Scott was given information about Chronic Care Management services today including:  1. CCM service includes personalized support from designated clinical staff supervised by her physician, including individualized plan of care and coordination with other care providers 2. 24/7 contact phone numbers for assistance for urgent and routine care needs. 3. Service will only be billed when office clinical staff spend 20 minutes or more in a month to coordinate care. 4. Only one practitioner may furnish and bill the service in a calendar month. 5. The patient may stop CCM services at any time (effective at the end of the month) by phone call to the office staff.   Patient did not agree to services and wishes to consider information provided before deciding about enrollment in care management services.    This note is not being shared with the patient for the following reason: To respect privacy (The patient or proxy has requested that the information not be shared).  Follow up plan:   Earney Hamburg Upstream Scheduler

## 2019-05-30 ENCOUNTER — Other Ambulatory Visit (HOSPITAL_COMMUNITY): Payer: Self-pay | Admitting: *Deleted

## 2019-05-31 ENCOUNTER — Other Ambulatory Visit: Payer: Self-pay

## 2019-05-31 ENCOUNTER — Ambulatory Visit (HOSPITAL_COMMUNITY)
Admission: RE | Admit: 2019-05-31 | Discharge: 2019-05-31 | Disposition: A | Discharge status: Home / Self Care | Payer: Medicare HMO | Source: Ambulatory Visit | Attending: Nephrology | Admitting: Nephrology

## 2019-05-31 DIAGNOSIS — N189 Chronic kidney disease, unspecified: Secondary | ICD-10-CM | POA: Insufficient documentation

## 2019-05-31 DIAGNOSIS — D631 Anemia in chronic kidney disease: Secondary | ICD-10-CM | POA: Diagnosis not present

## 2019-05-31 MED ORDER — SODIUM CHLORIDE 0.9 % IV SOLN
510.0000 mg | INTRAVENOUS | Status: DC
  Administered 2019-05-31: 510 mg via INTRAVENOUS
  Filled 2019-05-31: qty 17

## 2019-06-07 ENCOUNTER — Ambulatory Visit (HOSPITAL_COMMUNITY)
Admission: RE | Admit: 2019-06-07 | Discharge: 2019-06-07 | Disposition: A | Discharge status: Home / Self Care | Payer: Medicare HMO | Source: Ambulatory Visit | Attending: Nephrology | Admitting: Nephrology

## 2019-06-07 ENCOUNTER — Other Ambulatory Visit: Payer: Self-pay

## 2019-06-07 DIAGNOSIS — D631 Anemia in chronic kidney disease: Secondary | ICD-10-CM | POA: Diagnosis not present

## 2019-06-07 DIAGNOSIS — N189 Chronic kidney disease, unspecified: Secondary | ICD-10-CM | POA: Diagnosis not present

## 2019-06-07 MED ORDER — SODIUM CHLORIDE 0.9 % IV SOLN
510.0000 mg | INTRAVENOUS | Status: AC
  Administered 2019-06-07: 510 mg via INTRAVENOUS
  Filled 2019-06-07: qty 17

## 2019-06-13 ENCOUNTER — Ambulatory Visit: Payer: Medicare HMO | Admitting: Internal Medicine

## 2019-06-13 ENCOUNTER — Other Ambulatory Visit: Payer: Self-pay

## 2019-06-13 ENCOUNTER — Encounter: Payer: Self-pay | Admitting: Internal Medicine

## 2019-06-13 VITALS — BP 120/70 | HR 70 | Temp 97.8°F | Ht 60.0 in | Wt 187.0 lb

## 2019-06-13 DIAGNOSIS — R1310 Dysphagia, unspecified: Secondary | ICD-10-CM

## 2019-06-13 DIAGNOSIS — Z86010 Personal history of colonic polyps: Secondary | ICD-10-CM

## 2019-06-13 MED ORDER — NA SULFATE-K SULFATE-MG SULF 17.5-3.13-1.6 GM/177ML PO SOLN: 1.0000 | Freq: Once | ORAL | 0 refills | Status: AC

## 2019-06-13 NOTE — Progress Notes (Signed)
HISTORY OF PRESENT ILLNESS:  Marissa Scott is a pleasant 54 y.o. female with past medical history as listed below who presents today regarding swallowing problems.  Patient recently was evaluated by her physician via telehealth medicine May 11, 2019.  I have reviewed that encounter.  She described "squeezing" episodes where she loses the ability to initiate swallowing.  On one instance she describes a transient syncopal spell.  She has had this intermittently for at least the past year or so.  In recent months it was getting more frequent.  However, she states that after having had her thyroid medication dosage adjusted, the problem is better.  She denies reflux symptoms.  She is not on PPI.  No true esophageal dysphagia.  Next, she has a personal history of adenomatous colon polyps as well as a family history of colon cancer in her father around age 44.  Last colonoscopy February 2016.  Follow-up in 5 years recommended.  She does have hydrocodone listed as pain medication.  She has received 1 of 2 Covid vaccinations.  Review of blood work from May 12, 2019 shows normal liver tests.  Hemoglobin 9.8.  Hemoglobin A1c 6.9.  REVIEW OF SYSTEMS:  All non-GI ROS negative unless otherwise stated in the HPI except for arthritis, back pain, fatigue, itching, muscle cramps, skin rash  Past Medical History:  Diagnosis Date  . Anemia   . Arthritis   . DIABETES MELLITUS, TYPE II 01/30/2008  . HYPERLIPIDEMIA 01/30/2008  . HYPERTENSION 01/30/2008  . HYPERURICEMIA 10/01/2008  . Hypothyroidism   . Obesity   . Polycystic ovaries 01/04/2009  . RENAL DISEASE, CHRONIC, STAGE III 01/30/2008   Follows w/ Renal    Past Surgical History:  Procedure Laterality Date  . APPENDECTOMY    . COLONOSCOPY    . ESOPHAGOGASTRODUODENOSCOPY    . LUMBAR FUSION     L4-5  . LUMBAR LAMINECTOMY     L4-L5  . NEPHRECTOMY     left removed, partial right-Ottelin  . Neural Stimulator    . Right hand cyst    . TUBAL  LIGATION     GYN [facility]    Social History Marissa Scott  reports that she has never smoked. She has never used smokeless tobacco. She reports current alcohol use. She reports that she does not use drugs.  family history includes Colon cancer (age of onset: 36) in her cousin; Colon cancer (age of onset: 41) in her father; Diabetes in her father; Heart disease in her father; Hypertension in her father; Kidney cancer in her cousin; Lung cancer (age of onset: 73) in her maternal aunt; Stomach cancer (age of onset: 22) in her cousin; Stroke in her father.  Allergies  Allergen Reactions  . Enalapril Maleate   . Penicillins Rash       PHYSICAL EXAMINATION: Vital signs: BP 120/70   Pulse 70   Temp 97.8 F (36.6 C)   Ht 5' (1.524 m)   Wt 187 lb (84.8 kg)   LMP 10/27/2014   BMI 36.52 kg/m   Constitutional: Pleasant, generally well-appearing, no acute distress Psychiatric: alert and oriented x3, cooperative Eyes: extraocular movements intact, anicteric, conjunctiva pink Mouth: oral pharynx moist, no lesions Neck: supple no lymphadenopathy Cardiovascular: heart regular rate and rhythm, no murmur Lungs: clear to auscultation bilaterally Abdomen: soft, obese, nontender, nondistended, no obvious ascites, no peritoneal signs, normal bowel sounds, no organomegaly Rectal: Deferred until colonoscopy Extremities: no clubbing, cyanosis, or lower extremity edema bilaterally Skin: no lesions on visible  extremities Neuro: No focal deficits.  Cranial nerves intact  ASSESSMENT:  1.  Vague dysphagia.  Transfer type.  May be opioid induced dysmotility.  Evaluate for obstructive process such as hypertensive cricopharyngeus or proximal ring. 2.  Personal history of adenomatous colon polyps.  Due for surveillance 3.  Family history of colon cancer in father 36.  Multiple general medical problems   PLAN:  1.  Schedule upper endoscopy to evaluate swallowing problems.  The patient is higher than  baseline risk due to her general medical problems and body habitus.The nature of the procedure, as well as the risks, benefits, and alternatives were carefully and thoroughly reviewed with the patient. Ample time for discussion and questions allowed. The patient understood, was satisfied, and agreed to proceed. 2.  Schedule colonoscopy for surveillance.  Higher than baseline risk as above.The nature of the procedure, as well as the risks, benefits, and alternatives were carefully and thoroughly reviewed with the patient. Ample time for discussion and questions allowed. The patient understood, was satisfied, and agreed to proceed. 3.  Hold diabetic medications the day of the procedure to avoid unwanted hypoglycemia A total time of 45 minutes was spent preparing to see the patient, reviewing outside test, reviewing outside history, obtaining history separately, performing comprehensive physical exam, counseling the patient regarding her above listed issues, scheduling advanced procedures, and documenting clinical information in the health record

## 2019-06-13 NOTE — Patient Instructions (Signed)
You have been scheduled for an endoscopy and colonoscopy. Please follow the written instructions given to you at your visit today. Please pick up your prep supplies at the pharmacy within the next 1-3 days. If you use inhalers (even only as needed), please bring them with you on the day of your procedure.  

## 2019-06-20 ENCOUNTER — Telehealth: Payer: Self-pay

## 2019-06-20 MED ORDER — SUTAB 1479-225-188 MG PO TABS: 1.0000 | ORAL_TABLET | Freq: Once | ORAL | 0 refills | Status: AC

## 2019-06-20 NOTE — Telephone Encounter (Signed)
Did new Sutab instructions for patient and sent in rx for Sutab.  Instructed her to call me if she needed any clarifications with the instructions.

## 2019-07-14 ENCOUNTER — Encounter: Payer: Self-pay | Admitting: Internal Medicine

## 2019-07-18 ENCOUNTER — Other Ambulatory Visit: Payer: Self-pay | Admitting: Family Medicine

## 2019-07-19 ENCOUNTER — Other Ambulatory Visit (INDEPENDENT_AMBULATORY_CARE_PROVIDER_SITE_OTHER): Payer: Medicare HMO

## 2019-07-19 ENCOUNTER — Encounter: Payer: Self-pay | Admitting: General Practice

## 2019-07-19 DIAGNOSIS — E039 Hypothyroidism, unspecified: Secondary | ICD-10-CM

## 2019-07-19 LAB — TSH: TSH: 0.58 u[IU]/mL (ref 0.35–4.50)

## 2019-07-26 DIAGNOSIS — N184 Chronic kidney disease, stage 4 (severe): Secondary | ICD-10-CM | POA: Diagnosis not present

## 2019-07-27 ENCOUNTER — Other Ambulatory Visit: Payer: Self-pay

## 2019-07-27 ENCOUNTER — Encounter: Payer: Self-pay | Admitting: Internal Medicine

## 2019-07-27 ENCOUNTER — Ambulatory Visit (AMBULATORY_SURGERY_CENTER): Payer: Medicare HMO | Admitting: Internal Medicine

## 2019-07-27 VITALS — BP 106/78 | HR 70 | Temp 97.1°F | Resp 13 | Ht 60.0 in | Wt 187.0 lb

## 2019-07-27 DIAGNOSIS — I129 Hypertensive chronic kidney disease with stage 1 through stage 4 chronic kidney disease, or unspecified chronic kidney disease: Secondary | ICD-10-CM | POA: Diagnosis not present

## 2019-07-27 DIAGNOSIS — K635 Polyp of colon: Secondary | ICD-10-CM

## 2019-07-27 DIAGNOSIS — K2970 Gastritis, unspecified, without bleeding: Secondary | ICD-10-CM | POA: Diagnosis not present

## 2019-07-27 DIAGNOSIS — R1310 Dysphagia, unspecified: Secondary | ICD-10-CM

## 2019-07-27 DIAGNOSIS — K2950 Unspecified chronic gastritis without bleeding: Secondary | ICD-10-CM

## 2019-07-27 DIAGNOSIS — Z86010 Personal history of colonic polyps: Secondary | ICD-10-CM | POA: Diagnosis not present

## 2019-07-27 DIAGNOSIS — K2100 Gastro-esophageal reflux disease with esophagitis, without bleeding: Secondary | ICD-10-CM | POA: Diagnosis not present

## 2019-07-27 DIAGNOSIS — D124 Benign neoplasm of descending colon: Secondary | ICD-10-CM

## 2019-07-27 DIAGNOSIS — N183 Chronic kidney disease, stage 3 unspecified: Secondary | ICD-10-CM | POA: Diagnosis not present

## 2019-07-27 DIAGNOSIS — K2990 Gastroduodenitis, unspecified, without bleeding: Secondary | ICD-10-CM | POA: Diagnosis not present

## 2019-07-27 DIAGNOSIS — E1122 Type 2 diabetes mellitus with diabetic chronic kidney disease: Secondary | ICD-10-CM | POA: Diagnosis not present

## 2019-07-27 MED ORDER — SODIUM CHLORIDE 0.9 % IV SOLN: 500.0000 mL | Freq: Once | INTRAVENOUS | Status: DC

## 2019-07-27 MED ORDER — PANTOPRAZOLE SODIUM 40 MG PO TBEC: 40.0000 mg | DELAYED_RELEASE_TABLET | Freq: Every day | ORAL | 11 refills | Status: DC

## 2019-07-27 NOTE — Op Note (Signed)
[facility] Patient Name: Marissa Scott Procedure Date: 07/27/2019 2:07 PM MRN: [id number] Endoscopist: Docia Chuck. Henrene Pastor , MD Age: 54 Referring MD:  Date of Birth: [birth year] Gender: Female Account #: [id number] Procedure:                Upper GI endoscopy biopsies Indications:              Dysphagia (see office note) Medicines:                Monitored Anesthesia Care Procedure:                Pre-Anesthesia Assessment:                           - Prior to the procedure, a History and Physical                            was performed, and patient medications and                            allergies were reviewed. The patient's tolerance of                            previous anesthesia was also reviewed. The risks                            and benefits of the procedure and the sedation                            options and risks were discussed with the patient.                            All questions were answered, and informed consent                            was obtained. Prior Anticoagulants: The patient has                            taken no previous anticoagulant or antiplatelet                            agents. ASA Grade Assessment: II - A patient with                            mild systemic disease. After reviewing the risks                            and benefits, the patient was deemed in                            satisfactory condition to undergo the procedure.                           After obtaining informed consent, the endoscope was  passed under direct vision. Throughout the                            procedure, the patient's blood pressure, pulse, and                            oxygen saturations were monitored continuously. The                            Endoscope was introduced through the mouth, and                            advanced to the second part of duodenum. The upper                            GI endoscopy was  accomplished without difficulty.                            The patient tolerated the procedure well. Scope In: Scope Out: Findings:                 LA Grade C (one or more mucosal breaks continuous                            between tops of 2 or more mucosal folds, less than                            75% circumference) esophagitis was found.                           The stomach revealed tiny superficial erosions in                            the distal half with hematin. Biopsies were taken                            with a cold forceps for histology.                           The examined duodenum revealed mild patchy erythema                            but was otherwise normal.                           The cardia and gastric fundus were normal on                            retroflexion, save small hiatal hernia. Complications:            No immediate complications. Estimated Blood Loss:     Estimated blood loss: none. Impression:               1. GERD with erosive esophagitis  2. Gastroduodenitis status post biopsies                           3. Otherwise unremarkable EGD, save small hiatal                            hernia. Recommendation:           - Patient has a contact number available for                            emergencies. The signs and symptoms of potential                            delayed complications were discussed with the                            patient. Return to normal activities tomorrow.                            Written discharge instructions were provided to the                            patient.                           - Resume previous diet.                           - Continue present medications.                           - Await pathology results.                           - PRESCRIBE PANTOPRAZOLE 40 mg daily; #30; 11                            refills. Please take this medication DAILY to                             address your esophageal, stomach, and small bowel                            inflammation                           - OFFICE FOLLOW-UP WITH Dr. Henrene Pastor in 6 to 8 weeks Docia Chuck. Henrene Pastor, MD 07/27/2019 2:46:30 PM This report has been signed electronically.

## 2019-07-27 NOTE — Op Note (Signed)
[facility] Patient Name: Quinesha Selinger Procedure Date: 07/27/2019 2:07 PM MRN: [id number] Endoscopist: Docia Chuck. Henrene Pastor , MD Age: 54 Referring MD:  Date of Birth: February 05, 1965 Gender: Female Account #: [id number] Procedure:                Colonoscopy with cold snare polypectomy x 1 Indications:              High risk colon cancer surveillance: Personal                            history of non-advanced adenoma. Index examination                            February 2016. Father with colon cancer around 98 Medicines:                Monitored Anesthesia Care Procedure:                Pre-Anesthesia Assessment:                           - Prior to the procedure, a History and Physical                            was performed, and patient medications and                            allergies were reviewed. The patient's tolerance of                            previous anesthesia was also reviewed. The risks                            and benefits of the procedure and the sedation                            options and risks were discussed with the patient.                            All questions were answered, and informed consent                            was obtained. Prior Anticoagulants: The patient has                            taken no previous anticoagulant or antiplatelet                            agents. ASA Grade Assessment: II - A patient with                            mild systemic disease. After reviewing the risks                            and benefits, the patient was deemed in  satisfactory condition to undergo the procedure.                           After obtaining informed consent, the colonoscope                            was passed under direct vision. Throughout the                            procedure, the patient's blood pressure, pulse, and                            oxygen saturations were monitored continuously. The                             Colonoscope was introduced through the anus and                            advanced to the the cecum, identified by                            appendiceal orifice and ileocecal valve. The                            ileocecal valve, appendiceal orifice, and rectum                            were photographed. The quality of the bowel                            preparation was excellent. The colonoscopy was                            performed without difficulty. The patient tolerated                            the procedure well. The bowel preparation used was                            SUPREP via split dose instruction. Scope In: 2:19:59 PM Scope Out: 2:31:33 PM Scope Withdrawal Time: 0 hours 9 minutes 4 seconds  Total Procedure Duration: 0 hours 11 minutes 34 seconds  Findings:                 A 2 mm polyp was found in the descending colon. The                            polyp was removed with a cold snare. Resection and                            retrieval were complete.                           A few small-mouthed diverticula were found in the  sigmoid colon.                           The exam was otherwise without abnormality on                            direct and retroflexion views. Complications:            No immediate complications. Estimated blood loss:                            None. Estimated Blood Loss:     Estimated blood loss: none. Impression:               - One 2 mm polyp in the descending colon, removed                            with a cold snare. Resected and retrieved.                           - Diverticulosis in the sigmoid colon.                           - The examination was otherwise normal on direct                            and retroflexion views. Recommendation:           - Repeat colonoscopy in 5 years for surveillance.                           - Patient has a contact number available for                             emergencies. The signs and symptoms of potential                            delayed complications were discussed with the                            patient. Return to normal activities tomorrow.                            Written discharge instructions were provided to the                            patient.                           - Resume previous diet.                           - Continue present medications.                           - Await pathology results. Docia Chuck. Henrene Pastor, MD 07/27/2019 2:35:04 PM This report has been signed electronically.

## 2019-07-27 NOTE — Patient Instructions (Addendum)
Handouts on polyps & diverticulosis given to you today  Await pathology results on polyp removed    Handouts on GERD (reflux) and gastritis and esophagitis given to you today  Pick up Pantoprazole At [place] ( Please take daily 30 minutes before first meal of the day to address your esophageal, stomach,and small bowel inflammation   Make a follow up appointment with Dr Henrene Pastor for 6- 8 weeks from today   [place]:   Refer to the procedure report that was given to you for any specific questions about what was found during the examination.  If the procedure report does not answer your questions, please call your gastroenterologist to clarify.  If you requested that your care partner not be given the details of your procedure findings, then the procedure report has been included in a sealed envelope for you to review at your convenience later.  YOU SHOULD EXPECT: Some feelings of bloating in the abdomen. Passage of more gas than usual.  Walking can help get rid of the air that was put into your GI tract during the procedure and reduce the bloating. If you had a lower endoscopy (such as a colonoscopy or flexible sigmoidoscopy) you may notice spotting of blood in your stool or on the toilet paper. If you underwent a bowel prep for your procedure, you may not have a normal bowel movement for a few days.  Please Note:  You might notice some irritation and congestion in your nose or some drainage.  This is from the oxygen used during your procedure.  There is no need for concern and it should clear up in a day or so.  SYMPTOMS TO REPORT IMMEDIATELY:   Following lower endoscopy (colonoscopy or flexible sigmoidoscopy):  Excessive amounts of blood in the stool  Significant tenderness or worsening of abdominal pains  Swelling of the abdomen that is new, acute  Fever of 100F or higher   Following upper endoscopy (EGD)  Vomiting of  blood or coffee ground material  New chest pain or pain under the shoulder blades  Painful or persistently difficult swallowing  New shortness of breath  Fever of 100F or higher  Black, tarry-looking stools  For urgent or emergent issues, a gastroenterologist can be reached at any hour by calling [phone]. Do not use MyChart messaging for urgent concerns.    DIET:  We do recommend a small meal at first, but then you may proceed to your regular diet.  Drink plenty of fluids but you should avoid alcoholic beverages for 24 hours.  ACTIVITY:  You should plan to take it easy for the rest of today and you should NOT DRIVE or use heavy machinery until tomorrow (because of the sedation medicines used during the test).    FOLLOW UP: Our staff will call the number listed on your records 48-72 hours following your procedure to check on you and address any questions or concerns that you may have regarding the information given to you following your procedure. If we do not reach you, we will leave a message.  We will attempt to reach you two times.  During this call, we will ask if you have developed any symptoms of COVID 19. If you develop any symptoms (ie: fever, flu-like symptoms, shortness of breath, cough etc.) before then, please call [phone].  If you test positive for Covid 19 in the 2 weeks post procedure, please call and report this  information to [place].    If any biopsies were taken you will be contacted by phone or by letter within the next 1-3 weeks.  Please call us at [phone] if you have not heard about the biopsies in 3 weeks.    SIGNATURES/CONFIDENTIALITY: You and/or your care partner have signed paperwork which will be entered into your electronic medical record.  These signatures attest to the fact that that the information above on your After Visit Summary has been reviewed and is understood.  Full responsibility of the confidentiality of this discharge information lies  with you and/or your care-partner.

## 2019-07-27 NOTE — Progress Notes (Signed)
Report to PACU, RN, vss, BBS= Clear.  

## 2019-07-27 NOTE — Progress Notes (Signed)
Pt's states no medical or surgical changes since previsit or office visit.  Vitals VV 

## 2019-07-27 NOTE — Progress Notes (Signed)
Called to room to assist during endoscopic procedure.  Patient ID and intended procedure confirmed with present staff. Received instructions for my participation in the procedure from the performing physician.  

## 2019-07-31 ENCOUNTER — Telehealth: Payer: Self-pay | Admitting: Family Medicine

## 2019-07-31 ENCOUNTER — Encounter: Payer: Self-pay | Admitting: Family Medicine

## 2019-07-31 ENCOUNTER — Telehealth: Payer: Self-pay | Admitting: *Deleted

## 2019-07-31 ENCOUNTER — Telehealth: Payer: Self-pay

## 2019-07-31 NOTE — Telephone Encounter (Signed)
Spoke with patient did not want to sched

## 2019-07-31 NOTE — Telephone Encounter (Signed)
Follow up call made, no answer, left message 

## 2019-07-31 NOTE — Telephone Encounter (Signed)
Attempted to reach pt. With a follow-up call following endoscopic procedure 07/27/2019.  LM on pt. Voice mail to call if she has any questions or concerns.

## 2019-08-01 ENCOUNTER — Encounter: Payer: Self-pay | Admitting: Internal Medicine

## 2019-08-08 ENCOUNTER — Other Ambulatory Visit: Payer: Self-pay

## 2019-08-08 MED ORDER — LEVOTHYROXINE SODIUM 112 MCG PO TABS: 112.0000 ug | ORAL_TABLET | Freq: Every day | ORAL | 2 refills | Status: DC

## 2019-08-15 ENCOUNTER — Telehealth: Payer: Self-pay | Admitting: Family Medicine

## 2019-08-15 ENCOUNTER — Other Ambulatory Visit: Payer: Self-pay | Admitting: General Practice

## 2019-08-15 MED ORDER — LEVOTHYROXINE SODIUM 112 MCG PO TABS: 112.0000 ug | ORAL_TABLET | Freq: Every day | ORAL | 0 refills | Status: DC

## 2019-08-15 NOTE — Telephone Encounter (Signed)
Spoke with patient she declined AWV at this time 

## 2019-08-17 ENCOUNTER — Encounter: Payer: Self-pay | Admitting: Genetic Counselor

## 2019-08-17 NOTE — Progress Notes (Signed)
UPDATE: BRCA1 [id number] VUS was reclassified as "Likely Benign" as a result of re-review of the evidence in light of new variant interpretation guidelines and/or new information. The report date is August 16, 2019.

## 2019-09-06 ENCOUNTER — Other Ambulatory Visit: Payer: Self-pay

## 2019-09-06 ENCOUNTER — Encounter: Payer: Self-pay | Admitting: Family Medicine

## 2019-09-06 ENCOUNTER — Ambulatory Visit (INDEPENDENT_AMBULATORY_CARE_PROVIDER_SITE_OTHER): Payer: Medicare HMO | Admitting: Family Medicine

## 2019-09-06 VITALS — BP 120/72 | HR 78 | Temp 98.2°F | Resp 16 | Ht 60.0 in | Wt 188.4 lb

## 2019-09-06 DIAGNOSIS — Z0000 Encounter for general adult medical examination without abnormal findings: Secondary | ICD-10-CM

## 2019-09-06 DIAGNOSIS — E1169 Type 2 diabetes mellitus with other specified complication: Secondary | ICD-10-CM | POA: Diagnosis not present

## 2019-09-06 DIAGNOSIS — E1122 Type 2 diabetes mellitus with diabetic chronic kidney disease: Secondary | ICD-10-CM | POA: Diagnosis not present

## 2019-09-06 DIAGNOSIS — N183 Chronic kidney disease, stage 3 unspecified: Secondary | ICD-10-CM | POA: Diagnosis not present

## 2019-09-06 DIAGNOSIS — E785 Hyperlipidemia, unspecified: Secondary | ICD-10-CM | POA: Diagnosis not present

## 2019-09-06 LAB — BASIC METABOLIC PANEL
BUN: 73 mg/dL — ABNORMAL HIGH (ref 6–23)
CO2: 23 mEq/L (ref 19–32)
Calcium: 9.1 mg/dL (ref 8.4–10.5)
Chloride: 109 mEq/L (ref 96–112)
Creatinine, Ser: 2.49 mg/dL — ABNORMAL HIGH (ref 0.40–1.20)
GFR: 20.07 mL/min — ABNORMAL LOW (ref >60.00)
Glucose, Bld: 118 mg/dL — ABNORMAL HIGH (ref 70–99)
Potassium: 5.4 mEq/L — ABNORMAL HIGH (ref 3.5–5.1)
Sodium: 141 mEq/L (ref 135–145)

## 2019-09-06 LAB — LIPID PANEL
Cholesterol: 240 mg/dL — ABNORMAL HIGH (ref 0–200)
HDL: 41.8 mg/dL (ref >39.00)
LDL Cholesterol: 164 mg/dL — ABNORMAL HIGH (ref 0–99)
NonHDL: 198.3
Total CHOL/HDL Ratio: 6
Triglycerides: 170 mg/dL — ABNORMAL HIGH (ref 0.0–149.0)
VLDL: 34 mg/dL (ref 0.0–40.0)

## 2019-09-06 LAB — CBC WITH DIFFERENTIAL/PLATELET
Basophils Absolute: 0.1 10*3/uL (ref 0.0–0.1)
Basophils Relative: 0.9 % (ref 0.0–3.0)
Eosinophils Absolute: 0.4 10*3/uL (ref 0.0–0.7)
Eosinophils Relative: 4.5 % (ref 0.0–5.0)
HCT: 35.9 % — ABNORMAL LOW (ref 36.0–46.0)
Hemoglobin: 11.5 g/dL — ABNORMAL LOW (ref 12.0–15.0)
Lymphocytes Relative: 24.6 % (ref 12.0–46.0)
Lymphs Abs: 2.1 10*3/uL (ref 0.7–4.0)
MCHC: 32 g/dL (ref 30.0–36.0)
MCV: 91 fl (ref 78.0–100.0)
Monocytes Absolute: 0.5 10*3/uL (ref 0.1–1.0)
Monocytes Relative: 6.1 % (ref 3.0–12.0)
Neutro Abs: 5.5 10*3/uL (ref 1.4–7.7)
Neutrophils Relative %: 63.9 % (ref 43.0–77.0)
Platelets: 298 10*3/uL (ref 150.0–400.0)
RBC: 3.95 Mil/uL (ref 3.87–5.11)
RDW: 16.7 % — ABNORMAL HIGH (ref 11.5–15.5)
WBC: 8.6 10*3/uL (ref 4.0–10.5)

## 2019-09-06 LAB — HEPATIC FUNCTION PANEL
ALT: 9 U/L (ref 0–35)
AST: 11 U/L (ref 0–37)
Albumin: 3.8 g/dL (ref 3.5–5.2)
Alkaline Phosphatase: 76 U/L (ref 39–117)
Bilirubin, Direct: 0 mg/dL (ref 0.0–0.3)
Total Bilirubin: 0.2 mg/dL (ref 0.2–1.2)
Total Protein: 6.6 g/dL (ref 6.0–8.3)

## 2019-09-06 LAB — TSH: TSH: 1 u[IU]/mL (ref 0.35–4.50)

## 2019-09-06 LAB — MICROALBUMIN / CREATININE URINE RATIO
Creatinine,U: 86.1 mg/dL
Microalb Creat Ratio: 182.3 mg/g — ABNORMAL HIGH (ref 0.0–30.0)
Microalb, Ur: 156.9 mg/dL — ABNORMAL HIGH (ref 0.0–1.9)

## 2019-09-06 LAB — HEMOGLOBIN A1C: Hgb A1c MFr Bld: 6.5 % (ref 4.6–6.5)

## 2019-09-06 NOTE — Patient Instructions (Signed)
Follow up in 3-4 months to recheck sugars We'll notify you of your lab results and make any changes if needed Continue to work on healthy diet and regular exercise- you can do it! SCHEDULE your eye exam and have them send me a copy of your report Schedule your pap and mammogram at your convenience Call with any questions or concerns Stay Safe!  Stay Healthy!

## 2019-09-06 NOTE — Assessment & Plan Note (Signed)
Ongoing issue for pt.  BMI is 36.79 but given her multiple comorbidities- DM, HTN, Hyperlipidemia- she qualifies as morbidly obese.  Stressed need for healthy diet and regular exercise.

## 2019-09-06 NOTE — Assessment & Plan Note (Signed)
Pt's PE WNL w/ exception of obesity.  Due for pap/mammo- pt to call GYN.  Refused PNA vaccine.  UTD on colonoscopy.  Check labs.  Anticipatory guidance provided.

## 2019-09-06 NOTE — Progress Notes (Signed)
Subjective:    Patient ID: Marissa Scott, female    DOB: Aug 25, 1965, 54 y.o.   MRN: [id number]  HPI CPE- UTD on COVID vaccine.  Pt is overdue on multiple health maintenance items- Pneumovax, eye exam, foot exam, microalbumin, mammo, and pap.  UTD on colonoscopy.  Reviewed past medical, surgical, family and social histories.   Health Maintenance  Topic Date Due  . PNEUMOCOCCAL POLYSACCHARIDE VACCINE AGE 67-64 HIGH RISK  Never done  . FOOT EXAM  12/02/2017  . URINE MICROALBUMIN  12/02/2017  . OPHTHALMOLOGY EXAM  12/03/2017  . MAMMOGRAM  06/14/2019  . INFLUENZA VACCINE  09/03/2019  . PAP SMEAR-Modifier  10/26/2019  . TETANUS/TDAP  09/05/2020 (Originally 06/04/1983)  . Hepatitis C Screening  09/05/2020 (Originally 1965/12/16)  . HIV Screening  09/05/2020 (Originally 06/04/1979)  . HEMOGLOBIN A1C  11/11/2019  . COLONOSCOPY  07/26/2024  . COVID-19 Vaccine  Completed     Patient Care Team    Relationship Specialty Notifications Start End  Midge Minium, MD PCP - General   01/15/10   Bo Merino, MD Consulting Physician Rheumatology  09/01/13   Renato Shin, MD Consulting Physician Endocrinology  09/25/13   Jamal Maes, MD Consulting Physician Nephrology  01/15/14   Jari Pigg, MD Consulting Physician Dermatology  05/07/14   Sheryn Bison, MD Referring Physician Dermatology  11/22/15   Linda Hedges, [place] Physician Obstetrics and Gynecology  12/02/16       Review of Systems Patient reports no vision/ hearing changes, adenopathy,fever, weight change,  persistant/recurrent hoarseness , swallowing issues, chest pain, palpitations, edema, persistant/recurrent cough, hemoptysis, dyspnea (rest/exertional/paroxysmal nocturnal), gastrointestinal bleeding (melena, rectal bleeding), abdominal pain, significant heartburn, bowel changes, GU symptoms (dysuria, hematuria, incontinence), Gyn symptoms (abnormal  bleeding, pain),  syncope, focal weakness, memory loss,  numbness & tingling, skin/hair/nail changes, abnormal bruising or bleeding, anxiety, or depression.   This visit occurred during the SARS-CoV-2 public health emergency.  Safety protocols were in place, including screening questions prior to the visit, additional usage of staff PPE, and extensive cleaning of exam room while observing appropriate contact time as indicated for disinfecting solutions.       Objective:   Physical Exam General Appearance:    Alert, cooperative, no distress, appears stated age, obese  Head:    Normocephalic, without obvious abnormality, atraumatic  Eyes:    PERRL, conjunctiva/corneas clear, EOM's intact, fundi    benign, both eyes  Ears:    Normal TM's and external ear canals, both ears  Nose:   Deferred due to COVID  Throat:   Neck:   Supple, symmetrical, trachea midline, no adenopathy;    Thyroid: no enlargement/tenderness/nodules  Back:     Symmetric, no curvature, ROM normal, no CVA tenderness  Lungs:     Clear to auscultation bilaterally, respirations unlabored  Chest Wall:    No tenderness or deformity   Heart:    Regular rate and rhythm, S1 and S2 normal, no murmur, rub   or gallop  Breast Exam:    Deferred to GYN  Abdomen:     Soft, non-tender, bowel sounds active all four quadrants,    no masses, no organomegaly  Genitalia:    Deferred to GYN  Rectal:    Extremities:   Extremities normal, atraumatic, no cyanosis or edema  Pulses:   2+ and symmetric all extremities  Skin:   Skin color, texture, turgor normal, no rashes or lesions  Lymph nodes:   Cervical, supraclavicular, and axillary nodes normal  Neurologic:   CNII-XII intact, normal strength, sensation and reflexes    throughout          Assessment & Plan:

## 2019-09-06 NOTE — Assessment & Plan Note (Signed)
Chronic problem.  Not currently on a statin.  Was trying to control w/ diet and exercise.  Check labs.  Adjust tx plan prn

## 2019-09-06 NOTE — Assessment & Plan Note (Signed)
Chronic problem.  Pt has not been following w/ Dr Loanne Drilling as directed.  Due for eye exam- pt to schedule.  Foot exam done today.  Microalbumin ordered.  Check labs.  Adjust meds prn

## 2019-09-07 ENCOUNTER — Other Ambulatory Visit: Payer: Self-pay | Admitting: General Practice

## 2019-09-07 DIAGNOSIS — E1169 Type 2 diabetes mellitus with other specified complication: Secondary | ICD-10-CM

## 2019-09-07 DIAGNOSIS — E785 Hyperlipidemia, unspecified: Secondary | ICD-10-CM

## 2019-09-07 MED ORDER — ROSUVASTATIN CALCIUM 20 MG PO TABS
20.0000 mg | ORAL_TABLET | Freq: Every day | ORAL | 6 refills | Status: DC
Start: 2019-09-07 — End: 2020-05-03

## 2019-09-12 ENCOUNTER — Ambulatory Visit: Payer: Medicare HMO | Admitting: Internal Medicine

## 2019-10-04 ENCOUNTER — Other Ambulatory Visit: Payer: Self-pay | Admitting: Obstetrics & Gynecology

## 2019-10-04 DIAGNOSIS — Z1231 Encounter for screening mammogram for malignant neoplasm of breast: Secondary | ICD-10-CM

## 2019-10-05 DIAGNOSIS — M109 Gout, unspecified: Secondary | ICD-10-CM | POA: Diagnosis not present

## 2019-10-05 DIAGNOSIS — Q600 Renal agenesis, unilateral: Secondary | ICD-10-CM | POA: Diagnosis not present

## 2019-10-05 DIAGNOSIS — Z906 Acquired absence of other parts of urinary tract: Secondary | ICD-10-CM | POA: Diagnosis not present

## 2019-10-05 DIAGNOSIS — N184 Chronic kidney disease, stage 4 (severe): Secondary | ICD-10-CM | POA: Diagnosis not present

## 2019-10-05 DIAGNOSIS — E1122 Type 2 diabetes mellitus with diabetic chronic kidney disease: Secondary | ICD-10-CM | POA: Diagnosis not present

## 2019-10-05 DIAGNOSIS — I129 Hypertensive chronic kidney disease with stage 1 through stage 4 chronic kidney disease, or unspecified chronic kidney disease: Secondary | ICD-10-CM | POA: Diagnosis not present

## 2019-10-05 DIAGNOSIS — Z905 Acquired absence of kidney: Secondary | ICD-10-CM | POA: Diagnosis not present

## 2019-10-05 DIAGNOSIS — N1370 Vesicoureteral-reflux, unspecified: Secondary | ICD-10-CM | POA: Diagnosis not present

## 2019-10-06 ENCOUNTER — Ambulatory Visit
Admission: RE | Admit: 2019-10-06 | Discharge: 2019-10-06 | Disposition: A | Discharge status: Home / Self Care | Payer: Medicare HMO | Source: Ambulatory Visit | Attending: Obstetrics & Gynecology | Admitting: Obstetrics & Gynecology

## 2019-10-06 DIAGNOSIS — Z1231 Encounter for screening mammogram for malignant neoplasm of breast: Secondary | ICD-10-CM

## 2019-10-17 ENCOUNTER — Other Ambulatory Visit: Payer: Self-pay | Admitting: Family Medicine

## 2019-10-17 ENCOUNTER — Other Ambulatory Visit: Payer: Self-pay | Admitting: Orthopedic Surgery

## 2019-10-17 DIAGNOSIS — M25512 Pain in left shoulder: Secondary | ICD-10-CM | POA: Diagnosis not present

## 2019-10-30 ENCOUNTER — Inpatient Hospital Stay: Admission: RE | Admit: 2019-10-30 | Payer: Medicare HMO | Source: Ambulatory Visit

## 2019-10-30 ENCOUNTER — Other Ambulatory Visit: Payer: Medicare HMO

## 2019-12-19 ENCOUNTER — Ambulatory Visit: Payer: Medicare HMO | Admitting: Family Medicine

## 2020-01-03 ENCOUNTER — Encounter: Payer: Self-pay | Admitting: Family Medicine

## 2020-01-03 ENCOUNTER — Other Ambulatory Visit: Payer: Self-pay

## 2020-01-03 ENCOUNTER — Ambulatory Visit (INDEPENDENT_AMBULATORY_CARE_PROVIDER_SITE_OTHER): Payer: Medicare HMO | Admitting: Family Medicine

## 2020-01-03 VITALS — BP 137/80 | HR 74 | Temp 97.5°F | Resp 15 | Ht 60.0 in | Wt 196.2 lb

## 2020-01-03 DIAGNOSIS — E785 Hyperlipidemia, unspecified: Secondary | ICD-10-CM

## 2020-01-03 DIAGNOSIS — E1122 Type 2 diabetes mellitus with diabetic chronic kidney disease: Secondary | ICD-10-CM

## 2020-01-03 DIAGNOSIS — E1169 Type 2 diabetes mellitus with other specified complication: Secondary | ICD-10-CM | POA: Diagnosis not present

## 2020-01-03 DIAGNOSIS — N183 Chronic kidney disease, stage 3 unspecified: Secondary | ICD-10-CM

## 2020-01-03 LAB — HEPATIC FUNCTION PANEL
ALT: 7 U/L (ref 0–35)
AST: 9 U/L (ref 0–37)
Albumin: 4.1 g/dL (ref 3.5–5.2)
Alkaline Phosphatase: 74 U/L (ref 39–117)
Bilirubin, Direct: 0.1 mg/dL (ref 0.0–0.3)
Total Bilirubin: 0.3 mg/dL (ref 0.2–1.2)
Total Protein: 6.6 g/dL (ref 6.0–8.3)

## 2020-01-03 LAB — LIPID PANEL
Cholesterol: 253 mg/dL — ABNORMAL HIGH (ref 0–200)
HDL: 32.9 mg/dL — ABNORMAL LOW (ref >39.00)
NonHDL: 220.49
Total CHOL/HDL Ratio: 8
Triglycerides: 263 mg/dL — ABNORMAL HIGH (ref 0.0–149.0)
VLDL: 52.6 mg/dL — ABNORMAL HIGH (ref 0.0–40.0)

## 2020-01-03 LAB — BASIC METABOLIC PANEL
BUN: 56 mg/dL — ABNORMAL HIGH (ref 6–23)
CO2: 25 mEq/L (ref 19–32)
Calcium: 9.4 mg/dL (ref 8.4–10.5)
Chloride: 109 mEq/L (ref 96–112)
Creatinine, Ser: 2.49 mg/dL — ABNORMAL HIGH (ref 0.40–1.20)
GFR: 21.21 mL/min — ABNORMAL LOW (ref >60.00)
Glucose, Bld: 111 mg/dL — ABNORMAL HIGH (ref 70–99)
Potassium: 5.3 mEq/L — ABNORMAL HIGH (ref 3.5–5.1)
Sodium: 143 mEq/L (ref 135–145)

## 2020-01-03 LAB — HEMOGLOBIN A1C: Hgb A1c MFr Bld: 6.1 % (ref 4.6–6.5)

## 2020-01-03 LAB — LDL CHOLESTEROL, DIRECT: Direct LDL: 153 mg/dL

## 2020-01-03 NOTE — Patient Instructions (Signed)
Follow up in 3-4 months to recheck diabetes, cholesterol and BP We'll notify you of your lab results and make any changes if needed Try and work on low carb diet and regular exercise Add CoQ10 daily to help w/ muscle aches Call with any questions or concerns Stay Safe!  Stay Healthy! Happy Holidays!

## 2020-01-03 NOTE — Progress Notes (Signed)
° °  Subjective:    Patient ID: Kennieth Francois, female    DOB: 27-Feb-1965, 54 y.o.   MRN: [id number]  HPI DM- chronic problem, on Actos 30mg  daily.  Last A1C 6.5%  UTD on foot exam.  Due for eye exam.  UTD on microalbumin.  Pt reports feeling well.  No CP, SOB, HAs, visual changes, abd pain, N/V.  Rare symptomatic lows.  No numbness/tingling of hands/feet.  Morbid Obesity- pt has gained 12 lbs since last visit.  some walking, 'but not much lately'.  Hyperlipidemia- chronic problem.  Started Crestor 20mg  at last visit due to LDL 164.  Took the Crestor until she ran out.  Some muscle aches.   Review of Systems For ROS see HPI   This visit occurred during the SARS-CoV-2 public health emergency.  Safety protocols were in place, including screening questions prior to the visit, additional usage of staff PPE, and extensive cleaning of exam room while observing appropriate contact time as indicated for disinfecting solutions.       Objective:   Physical Exam Constitutional:      General: She is not in acute distress.    Appearance: She is well-developed. She is obese.  HENT:     Head: Normocephalic and atraumatic.  Eyes:     Conjunctiva/sclera: Conjunctivae normal.     Pupils: Pupils are equal, round, and reactive to light.  Neck:     Thyroid: No thyromegaly.  Cardiovascular:     Rate and Rhythm: Normal rate and regular rhythm.     Heart sounds: Normal heart sounds. No murmur heard.   Pulmonary:     Effort: Pulmonary effort is normal. No respiratory distress.     Breath sounds: Normal breath sounds.  Abdominal:     General: There is no distension.     Palpations: Abdomen is soft.     Tenderness: There is no abdominal tenderness.  Musculoskeletal:     Cervical back: Normal range of motion and neck supple.     Right lower leg: No edema.     Left lower leg: No edema.  Lymphadenopathy:     Cervical: No cervical adenopathy.  Skin:    General: Skin is warm and dry.    Neurological:     Mental Status: She is alert and oriented to person, place, and time.  Psychiatric:        Behavior: Behavior normal.           Assessment & Plan:

## 2020-01-03 NOTE — Assessment & Plan Note (Signed)
Deteriorated.  Pt has gained 12 lbs since last visit.  Stressed need for healthy diet and regular exercise.  Will follow.

## 2020-01-03 NOTE — Assessment & Plan Note (Signed)
Chronic problem.  Last A1C was excellent at 6.5% on Actos 30mg  but pt reports she has not been eating well.  Overdue for eye exam- again encouraged to schedule.  UTD on foot exam, microalbumin.  Check labs.  Adjust meds prn

## 2020-01-03 NOTE — Assessment & Plan Note (Signed)
Chronic problem.  Took Crestor until she ran out.  Reported muscle aches w/ the medication.  Discussed addition of CoQ10.  Check labs.  Adjust meds prn

## 2020-01-05 ENCOUNTER — Other Ambulatory Visit: Payer: Self-pay | Admitting: Family Medicine

## 2020-02-14 ENCOUNTER — Other Ambulatory Visit: Payer: Self-pay | Admitting: Nephrology

## 2020-02-14 ENCOUNTER — Encounter: Payer: Self-pay | Admitting: Family Medicine

## 2020-02-14 DIAGNOSIS — Q600 Renal agenesis, unilateral: Secondary | ICD-10-CM | POA: Diagnosis not present

## 2020-02-14 DIAGNOSIS — Z905 Acquired absence of kidney: Secondary | ICD-10-CM

## 2020-02-14 DIAGNOSIS — N390 Urinary tract infection, site not specified: Secondary | ICD-10-CM | POA: Diagnosis not present

## 2020-02-14 DIAGNOSIS — R109 Unspecified abdominal pain: Secondary | ICD-10-CM

## 2020-02-14 DIAGNOSIS — N184 Chronic kidney disease, stage 4 (severe): Secondary | ICD-10-CM

## 2020-02-14 DIAGNOSIS — D649 Anemia, unspecified: Secondary | ICD-10-CM | POA: Diagnosis not present

## 2020-02-14 DIAGNOSIS — N1370 Vesicoureteral-reflux, unspecified: Secondary | ICD-10-CM

## 2020-02-14 DIAGNOSIS — E875 Hyperkalemia: Secondary | ICD-10-CM | POA: Diagnosis not present

## 2020-02-14 DIAGNOSIS — Z906 Acquired absence of other parts of urinary tract: Secondary | ICD-10-CM | POA: Diagnosis not present

## 2020-02-15 NOTE — Telephone Encounter (Signed)
Reviewing in PCP absence.  Patient would need video visit to discuss with PCP.

## 2020-02-16 ENCOUNTER — Other Ambulatory Visit (HOSPITAL_COMMUNITY): Payer: Self-pay | Admitting: Nephrology

## 2020-02-16 DIAGNOSIS — N184 Chronic kidney disease, stage 4 (severe): Secondary | ICD-10-CM

## 2020-02-16 DIAGNOSIS — N1370 Vesicoureteral-reflux, unspecified: Secondary | ICD-10-CM

## 2020-02-16 DIAGNOSIS — Z905 Acquired absence of kidney: Secondary | ICD-10-CM

## 2020-02-16 DIAGNOSIS — R109 Unspecified abdominal pain: Secondary | ICD-10-CM

## 2020-02-28 ENCOUNTER — Ambulatory Visit (HOSPITAL_COMMUNITY)
Admission: RE | Admit: 2020-02-28 | Discharge: 2020-02-28 | Disposition: A | Discharge status: Home / Self Care | Payer: Medicare HMO | Source: Ambulatory Visit | Attending: Nephrology | Admitting: Nephrology

## 2020-02-28 ENCOUNTER — Other Ambulatory Visit: Payer: Self-pay

## 2020-02-28 DIAGNOSIS — Z905 Acquired absence of kidney: Secondary | ICD-10-CM

## 2020-02-28 DIAGNOSIS — R109 Unspecified abdominal pain: Secondary | ICD-10-CM

## 2020-02-28 DIAGNOSIS — Z906 Acquired absence of other parts of urinary tract: Secondary | ICD-10-CM | POA: Insufficient documentation

## 2020-02-28 DIAGNOSIS — N184 Chronic kidney disease, stage 4 (severe): Secondary | ICD-10-CM | POA: Insufficient documentation

## 2020-02-28 DIAGNOSIS — N1370 Vesicoureteral-reflux, unspecified: Secondary | ICD-10-CM | POA: Insufficient documentation

## 2020-02-28 DIAGNOSIS — I700 Atherosclerosis of aorta: Secondary | ICD-10-CM | POA: Diagnosis not present

## 2020-02-28 DIAGNOSIS — M47816 Spondylosis without myelopathy or radiculopathy, lumbar region: Secondary | ICD-10-CM | POA: Diagnosis not present

## 2020-03-12 DIAGNOSIS — M961 Postlaminectomy syndrome, not elsewhere classified: Secondary | ICD-10-CM | POA: Diagnosis not present

## 2020-03-12 DIAGNOSIS — I10 Essential (primary) hypertension: Secondary | ICD-10-CM | POA: Diagnosis not present

## 2020-03-12 DIAGNOSIS — Z6838 Body mass index (BMI) 38.0-38.9, adult: Secondary | ICD-10-CM | POA: Insufficient documentation

## 2020-03-12 DIAGNOSIS — R0781 Pleurodynia: Secondary | ICD-10-CM | POA: Insufficient documentation

## 2020-03-12 DIAGNOSIS — M546 Pain in thoracic spine: Secondary | ICD-10-CM | POA: Diagnosis not present

## 2020-04-28 ENCOUNTER — Other Ambulatory Visit: Payer: Self-pay | Admitting: Family Medicine

## 2020-05-03 ENCOUNTER — Encounter: Payer: Self-pay | Admitting: Family Medicine

## 2020-05-03 ENCOUNTER — Other Ambulatory Visit: Payer: Self-pay

## 2020-05-03 ENCOUNTER — Ambulatory Visit (INDEPENDENT_AMBULATORY_CARE_PROVIDER_SITE_OTHER): Payer: Medicare HMO | Admitting: Family Medicine

## 2020-05-03 VITALS — BP 142/68 | HR 59 | Temp 97.2°F | Resp 18 | Ht 60.0 in | Wt 197.0 lb

## 2020-05-03 DIAGNOSIS — E1169 Type 2 diabetes mellitus with other specified complication: Secondary | ICD-10-CM

## 2020-05-03 DIAGNOSIS — E1122 Type 2 diabetes mellitus with diabetic chronic kidney disease: Secondary | ICD-10-CM | POA: Diagnosis not present

## 2020-05-03 DIAGNOSIS — I10 Essential (primary) hypertension: Secondary | ICD-10-CM

## 2020-05-03 DIAGNOSIS — E785 Hyperlipidemia, unspecified: Secondary | ICD-10-CM | POA: Diagnosis not present

## 2020-05-03 DIAGNOSIS — N183 Chronic kidney disease, stage 3 unspecified: Secondary | ICD-10-CM

## 2020-05-03 DIAGNOSIS — E039 Hypothyroidism, unspecified: Secondary | ICD-10-CM

## 2020-05-03 MED ORDER — PHENTERMINE HCL 37.5 MG PO CAPS: 37.5000 mg | ORAL_CAPSULE | ORAL | 0 refills | Status: DC

## 2020-05-03 NOTE — Progress Notes (Signed)
   Subjective:    Patient ID: Kennieth Francois, female    DOB: [birth year], 55 y.o.   MRN: [id number]  HPI DM- chronic problem, on Actos 30mg  daily.  UTD on foot exam.  Due for eye exam.  UTD on microalbumin.  Not checking CBGs.  2 symptomatic lows since last visit.  No numbness/tingling of hands/feet  HTN- chronic problem, on Amlodipine 5mg  daily, Coreg 12.5mg  BID, Lasix 20mg  daily.  BP is mildly elevated today at 142/68.  Pt reports she has been very busy running around today.  No CP, SOB, HAs, visual changes, edema.  Hyperlipidemia- chronic problem, on Crestor 10mg  nightly.  No abd pain, N/V.  Hypothyroid- chronic problem.  On Levothyroxine 164mcg daily.  Denies changes to skin/hair/nails   Review of Systems For ROS see HPI   This visit occurred during the SARS-CoV-2 public health emergency.  Safety protocols were in place, including screening questions prior to the visit, additional usage of staff PPE, and extensive cleaning of exam room while observing appropriate contact time as indicated for disinfecting solutions.       Objective:   Physical Exam Vitals reviewed.  Constitutional:      General: She is not in acute distress.    Appearance: Normal appearance. She is well-developed. She is obese.  HENT:     Head: Normocephalic and atraumatic.  Eyes:     Conjunctiva/sclera: Conjunctivae normal.     Pupils: Pupils are equal, round, and reactive to light.  Neck:     Thyroid: No thyromegaly.  Cardiovascular:     Rate and Rhythm: Normal rate and regular rhythm.     Pulses: Normal pulses.     Heart sounds: Normal heart sounds. No murmur heard.   Pulmonary:     Effort: Pulmonary effort is normal. No respiratory distress.     Breath sounds: Normal breath sounds.  Abdominal:     General: There is no distension.     Palpations: Abdomen is soft.     Tenderness: There is no abdominal tenderness.  Musculoskeletal:     Cervical back: Normal range of motion and neck supple.      Right lower leg: No edema.     Left lower leg: No edema.  Lymphadenopathy:     Cervical: No cervical adenopathy.  Skin:    General: Skin is warm and dry.  Neurological:     Mental Status: She is alert and oriented to person, place, and time.  Psychiatric:        Behavior: Behavior normal.           Assessment & Plan:

## 2020-05-03 NOTE — Assessment & Plan Note (Signed)
Chronic problem.  On Levothyroxine 179mcg daily.  Check labs.  Adjust meds prn

## 2020-05-03 NOTE — Assessment & Plan Note (Signed)
Chronic problem.  BP was mildly elevated but pt states she has been running around all day.  On Amlodipine 5mg  daily, Coreg 12.5mg  BID, and Lasix 20mg  daily.  Encouraged low Na diet, increased water intake, and regular exercise.  Will follow.

## 2020-05-03 NOTE — Assessment & Plan Note (Signed)
Chronic problem.  On Actos 30mg  daily w/o difficulty.  UTD on foot exam, microalbumin.  Overdue for eye exam.  Encouraged her to schedule.  Not checking CBGs.  Check labs.  Adjust meds prn

## 2020-05-03 NOTE — Assessment & Plan Note (Addendum)
Ongoing issue for pt.  BMI is 38.47 but with her other medical issues, this qualifies as morbidly obese.  Stressed the need for healthy diet and regular exercise.  Will start Phentermine at pt's request to help jump start weight loss.  Will follow.

## 2020-05-03 NOTE — Assessment & Plan Note (Signed)
Chronic problem.  On Crestor 10mg  nightly.  Encouraged healthy diet and regular exercise.  Check labs.  Adjust meds prn

## 2020-05-03 NOTE — Patient Instructions (Addendum)
Schedule your complete physical after 8/4 We'll notify you of your lab results and make any changes if needed Schedule your eye exam and have them send me a copy of their report Continue to work on healthy diet and regular exercise- you can do it!!! Call with any questions or concerns Stay Safe!  Stay Healthy! Happy Spring!!!

## 2020-05-04 LAB — HEPATIC FUNCTION PANEL
AG Ratio: 1.6 (calc) (ref 1.0–2.5)
ALT: 7 U/L (ref 6–29)
AST: 11 U/L (ref 10–35)
Albumin: 4.2 g/dL (ref 3.6–5.1)
Alkaline phosphatase (APISO): 73 U/L (ref 37–153)
Bilirubin, Direct: 0.1 mg/dL (ref 0.0–0.2)
Globulin: 2.7 g/dL (calc) (ref 1.9–3.7)
Indirect Bilirubin: 0.2 mg/dL (calc) (ref 0.2–1.2)
Total Bilirubin: 0.3 mg/dL (ref 0.2–1.2)
Total Protein: 6.9 g/dL (ref 6.1–8.1)

## 2020-05-04 LAB — CBC WITH DIFFERENTIAL/PLATELET
Absolute Monocytes: 538 cells/uL (ref 200–950)
Basophils Absolute: 67 cells/uL (ref 0–200)
Basophils Relative: 0.7 %
Eosinophils Absolute: 125 cells/uL (ref 15–500)
Eosinophils Relative: 1.3 %
HCT: 31.2 % — ABNORMAL LOW (ref 35.0–45.0)
Hemoglobin: 9.9 g/dL — ABNORMAL LOW (ref 11.7–15.5)
Lymphs Abs: 1296 cells/uL (ref 850–3900)
MCH: 28.3 pg (ref 27.0–33.0)
MCHC: 31.7 g/dL — ABNORMAL LOW (ref 32.0–36.0)
MCV: 89.1 fL (ref 80.0–100.0)
MPV: 11 fL (ref 7.5–12.5)
Monocytes Relative: 5.6 %
Neutro Abs: 7574 cells/uL (ref 1500–7800)
Neutrophils Relative %: 78.9 %
Platelets: 258 10*3/uL (ref 140–400)
RBC: 3.5 10*6/uL — ABNORMAL LOW (ref 3.80–5.10)
RDW: 14.9 % (ref 11.0–15.0)
Total Lymphocyte: 13.5 %
WBC: 9.6 10*3/uL (ref 3.8–10.8)

## 2020-05-04 LAB — LIPID PANEL
Cholesterol: 139 mg/dL (ref <200)
HDL: 43 mg/dL — ABNORMAL LOW (ref >=50)
LDL Cholesterol (Calc): 77 mg/dL (calc)
Non-HDL Cholesterol (Calc): 96 mg/dL (calc) (ref <130)
Total CHOL/HDL Ratio: 3.2 (calc) (ref <5.0)
Triglycerides: 111 mg/dL (ref <150)

## 2020-05-04 LAB — BASIC METABOLIC PANEL
BUN/Creatinine Ratio: 18 (calc) (ref 6–22)
BUN: 47 mg/dL — ABNORMAL HIGH (ref 7–25)
CO2: 20 mmol/L (ref 20–32)
Calcium: 9.7 mg/dL (ref 8.6–10.4)
Chloride: 110 mmol/L (ref 98–110)
Creat: 2.64 mg/dL — ABNORMAL HIGH (ref 0.50–1.05)
Glucose, Bld: 89 mg/dL (ref 65–99)
Potassium: 4.5 mmol/L (ref 3.5–5.3)
Sodium: 143 mmol/L (ref 135–146)

## 2020-05-04 LAB — HEMOGLOBIN A1C
Hgb A1c MFr Bld: 5.7 % of total Hgb — ABNORMAL HIGH (ref <5.7)
Mean Plasma Glucose: 117 mg/dL
eAG (mmol/L): 6.5 mmol/L

## 2020-05-04 LAB — TSH: TSH: 0.17 mIU/L — ABNORMAL LOW

## 2020-05-05 ENCOUNTER — Other Ambulatory Visit: Payer: Self-pay | Admitting: Family Medicine

## 2020-05-06 ENCOUNTER — Other Ambulatory Visit: Payer: Self-pay

## 2020-05-06 DIAGNOSIS — E039 Hypothyroidism, unspecified: Secondary | ICD-10-CM

## 2020-05-06 MED ORDER — PHENTERMINE HCL 37.5 MG PO CAPS: 37.5000 mg | ORAL_CAPSULE | ORAL | 0 refills | Status: DC

## 2020-05-06 MED ORDER — LEVOTHYROXINE SODIUM 100 MCG PO TABS: 100.0000 ug | ORAL_TABLET | Freq: Every day | ORAL | 3 refills | Status: DC

## 2020-05-06 NOTE — Telephone Encounter (Signed)
Pt requesting you resend Rx Phentermine due to cost at [facility] being higher than at [facility] with Good Rx card.  Pended below for correct location.

## 2020-05-09 ENCOUNTER — Telehealth: Payer: Self-pay | Admitting: Family Medicine

## 2020-05-09 NOTE — Telephone Encounter (Signed)
Patient declined AWV 

## 2020-05-15 ENCOUNTER — Encounter: Payer: Self-pay | Admitting: Family Medicine

## 2020-05-15 DIAGNOSIS — E039 Hypothyroidism, unspecified: Secondary | ICD-10-CM

## 2020-05-16 MED ORDER — LEVOTHYROXINE SODIUM 100 MCG PO TABS: 100.0000 ug | ORAL_TABLET | Freq: Every day | ORAL | 3 refills | Status: DC

## 2020-06-14 ENCOUNTER — Ambulatory Visit: Payer: Medicare HMO | Admitting: Family Medicine

## 2020-06-26 ENCOUNTER — Encounter: Payer: Self-pay | Admitting: Family Medicine

## 2020-06-27 ENCOUNTER — Ambulatory Visit
Admission: EM | Admit: 2020-06-27 | Discharge: 2020-06-27 | Disposition: A | Discharge status: Home / Self Care | Payer: Medicare HMO | Attending: Family Medicine | Admitting: Family Medicine

## 2020-06-27 DIAGNOSIS — L237 Allergic contact dermatitis due to plants, except food: Secondary | ICD-10-CM | POA: Diagnosis not present

## 2020-06-27 MED ORDER — PREDNISONE 10 MG PO TABS: ORAL_TABLET | ORAL | 0 refills | Status: DC

## 2020-06-27 NOTE — Discharge Instructions (Signed)
Monitor blood sugars very closely while taking the prednisone.  Follow-up with primary care if not fully resolving.

## 2020-06-27 NOTE — ED Triage Notes (Signed)
Pt c/o poison ivy to face, neck, chest, and between legs since Tuesday. C/o itching.

## 2020-06-27 NOTE — ED Provider Notes (Signed)
[facility]    CSN: [id number] Arrival date & time: 06/27/20  1434      History   Chief Complaint Chief Complaint  Patient presents with  . Poison Ivy    HPI Marissa Scott is a 55 y.o. female.   Patient presenting today with 3-day history of progressively worsening poison ivy rash.  She states that started out on bilateral inner thighs but is now on face, neck, chest, and bilateral arms starting this morning.  Severely itchy, starting to swell up in patches.  Has been trying triamcinolone cream without benefit.  Known exposure to poison ivy.     Past Medical History:  Diagnosis Date  . Anemia   . Arthritis   . DIABETES MELLITUS, TYPE II 01/30/2008  . HYPERLIPIDEMIA 01/30/2008  . HYPERTENSION 01/30/2008  . HYPERURICEMIA 10/01/2008  . Hypothyroidism   . Obesity   . Polycystic ovaries 01/04/2009  . RENAL DISEASE, CHRONIC, STAGE III 01/30/2008   Follows w/ Renal    Patient Active Problem List   Diagnosis Date Noted  . Rib pain on right side 03/12/2020  . Pain in thoracic spine 07/29/2018  . Lumbar spondylosis 06/28/2018  . Opioid dependence ([place]) 06/28/2018  . Allergic rhinitis 10/25/2017  . Genetic testing 05/20/2016  . Ganglion cyst 11/26/2015  . Anemia, iron deficiency 07/30/2015  . Hypothyroidism 11/06/2014  . Back pain 10/17/2014  . Physical exam 05/07/2014  . Polyarthralgia 11/30/2013  . Morbid obesity ([place]) 09/22/2012  . Type 2 diabetes mellitus with stage 3 chronic kidney disease, without long-term current use of insulin ([place]) 06/02/2011  . POLYCYSTIC OVARIES 01/04/2009  . Hyperuricemia 10/01/2008  . WRIST PAIN, LEFT 06/06/2008  . DERMATITIS, SCALP 03/13/2008  . Hyperlipidemia associated with type 2 diabetes mellitus ([place]) 01/30/2008  . Essential hypertension 01/30/2008  . RENAL DISEASE, CHRONIC, STAGE III 01/30/2008  . LEG CRAMPS 01/30/2008    Past Surgical History:  Procedure Laterality Date  . APPENDECTOMY    . COLONOSCOPY   03/05/2014  . ESOPHAGOGASTRODUODENOSCOPY    . LUMBAR FUSION     L4-5  . LUMBAR LAMINECTOMY     L4-L5  . NEPHRECTOMY     left removed, partial right-[place]  . Neural Stimulator    . POLYPECTOMY    . Right hand cyst    . TUBAL LIGATION     GYN [facility]    OB History   No obstetric history on file.      Home Medications    Prior to Admission medications   Medication Sig Start Date End Date Taking? Authorizing Provider  predniSONE (DELTASONE) 10 MG tablet Take 6 tabs daily x 2 days, 5 tabs daily x 2 days, 4 tabs daily x 2 days, etc 06/27/20  Yes Volney American, PA-C  acetaminophen (TYLENOL) 650 MG CR tablet Take by mouth. Patient not taking: No sig reported    [provider]  allopurinol (ZYLOPRIM) 100 MG tablet  05/16/15   [provider]  amLODipine (NORVASC) 5 MG tablet TAKE 1 TABLET EVERY DAY 04/29/20   Midge Minium, MD  carvedilol (COREG) 12.5 MG tablet TAKE 1 TABLET TWICE DAILY WITH  MEALS 04/29/20   Midge Minium, MD  COLCRYS 0.6 MG tablet Take 0.6 mg by mouth daily. 07/30/17   [provider]  cyclobenzaprine (FLEXERIL) 10 MG tablet TAKE 1 TABLET BY MOUTH EVERY 8 HOURS AS NEEDED FOR SPASMS 07/29/18   [provider]  fluticasone (FLONASE) 50 MCG/ACT nasal spray Place 2 sprays  into both nostrils daily. Patient not taking: No sig reported 10/25/17   Midge Minium, MD  furosemide (LASIX) 20 MG tablet Take by mouth as needed.     [provider]  gabapentin (NEURONTIN) 100 MG capsule  11/29/18   [provider]  levothyroxine (SYNTHROID) 100 MCG tablet Take 1 tablet (100 mcg total) by mouth daily. 05/16/20   Midge Minium, MD  Multiple Vitamin (MULTI-VITAMINS) TABS Take 1 tablet by mouth daily.    [provider]  phentermine 37.5 MG capsule Take 1 capsule (37.5 mg total) by mouth every morning. 05/06/20   Midge Minium, MD  pioglitazone (ACTOS) 30 MG tablet TAKE 1 TABLET EVERY DAY  04/29/20   Midge Minium, MD  rosuvastatin (CRESTOR) 10 MG tablet TAKE 1 TABLET (10 MG TOTAL) BY MOUTH AT BEDTIME. 01/05/20   Midge Minium, MD  traMADol (ULTRAM) 50 MG tablet TAKE 1 TABLET BY MOUTH EVERY 24 HOURS AS NEEDED 06/28/18   [provider]  triamcinolone cream (KENALOG) 0.1 % Apply 1 application topically 2 (two) times daily. 05/17/19   Midge Minium, MD    Family History Family History  Problem Relation Age of Onset  . Hypertension Father   . Heart disease Father        CAD  . Diabetes Father   . Stroke Father   . Colon cancer Father 71  . Colon polyps Father   . Colon cancer Cousin 42       Lynch Syndrome  . Stomach cancer Cousin 6  . Kidney cancer Cousin   . Lung cancer Maternal Aunt 60       history of smoking  . Esophageal cancer Neg Hx   . Rectal cancer Neg Hx     Social History Social History   Tobacco Use  . Smoking status: Never Smoker  . Smokeless tobacco: Never Used  Vaping Use  . Vaping Use: Never used  Substance Use Topics  . Alcohol use: Yes    Comment: rarely  . Drug use: No     Allergies   Enalapril maleate and Penicillins   Review of Systems Review of Systems Per HPI Physical Exam Triage Vital Signs ED Triage Vitals  Enc Vitals Group     BP 06/27/20 1540 138/78     Pulse Rate 06/27/20 1540 69     Resp 06/27/20 1540 18     Temp 06/27/20 1540 97.8 F (36.6 C)     Temp Source 06/27/20 1540 Oral     SpO2 06/27/20 1540 99 %     Weight --      Height --      Head Circumference --      Peak Flow --      Pain Score 06/27/20 1437 0     Pain Loc --      Pain Edu? --      Excl. in [place]? --    No data found.  Updated Vital Signs BP 138/78 (BP Location: Left Arm)   Pulse 69   Temp 97.8 F (36.6 C) (Oral)   Resp 18   LMP 10/27/2014 Comment: LMP x 1 year ago   SpO2 99%   Visual Acuity Right Eye Distance:   Left Eye Distance:   Bilateral Distance:    Right Eye Near:   Left Eye Near:    Bilateral  Near:     Physical Exam Vitals and nursing note reviewed.  Constitutional:  Appearance: Normal appearance. She is not ill-appearing.  HENT:     Head: Atraumatic.     Mouth/Throat:     Mouth: Mucous membranes are moist.     Pharynx: Oropharynx is clear.  Eyes:     Extraocular Movements: Extraocular movements intact.     Conjunctiva/sclera: Conjunctivae normal.  Cardiovascular:     Rate and Rhythm: Normal rate and regular rhythm.     Heart sounds: Normal heart sounds.  Pulmonary:     Effort: Pulmonary effort is normal.     Breath sounds: Normal breath sounds.  Musculoskeletal:        General: Normal range of motion.     Cervical back: Normal range of motion and neck supple.  Skin:    General: Skin is warm and dry.     Findings: Rash present.     Comments: Erythematous maculopapular rash with blistering in patches across body as listed above.  Bilateral inner thighs, bilateral upper extremities, bilateral face and chest  Neurological:     Mental Status: She is alert and oriented to person, place, and time.  Psychiatric:        Mood and Affect: Mood normal.        Thought Content: Thought content normal.        Judgment: Judgment normal.    UC Treatments / Results  Labs (all labs ordered are listed, but only abnormal results are displayed) Labs Reviewed - No data to display  EKG   Radiology No results found.  Procedures Procedures (including critical care time)  Medications Ordered in UC Medications - No data to display  Initial Impression / Assessment and Plan / UC Course  I have reviewed the triage vital signs and the nursing notes.  Pertinent labs & imaging results that were available during my care of the patient were reviewed by me and considered in my medical decision making (see chart for details).     Given extent and persistent spread, will start extended prednisone taper in addition to her triamcinolone and antihistamine regimen.  She does have a  history of diabetes mellitus but this is under excellent control per recent lab results.  She does have a home glucose monitor and knows to monitor her blood sugars closely and DC the medication at any point if her blood sugars become too elevated.  Follow-up with PCP if not fully resolving.  Final Clinical Impressions(s) / UC Diagnoses   Final diagnoses:  Poison ivy dermatitis     Discharge Instructions     Monitor blood sugars very closely while taking the prednisone.  Follow-up with primary care if not fully resolving.    ED Prescriptions    Medication Sig Dispense Auth. Provider   predniSONE (DELTASONE) 10 MG tablet Take 6 tabs daily x 2 days, 5 tabs daily x 2 days, 4 tabs daily x 2 days, etc 42 tablet Volney American, [place]     PDMP not reviewed this encounter.   Volney American, [place] 06/27/20 1616

## 2020-06-28 ENCOUNTER — Ambulatory Visit: Payer: Medicare HMO | Admitting: Family Medicine

## 2020-07-19 ENCOUNTER — Telehealth: Payer: Self-pay | Admitting: Family Medicine

## 2020-07-19 NOTE — Progress Notes (Signed)
  Chronic Care Management   Outreach Note  07/19/2020 Name: Keelan Pomerleau MRN: [id number] DOB: [birth year]  Referred by: Midge Minium, MD Reason for referral : No chief complaint on file.   An unsuccessful telephone outreach was attempted today. The patient was referred to the pharmacist for assistance with care management and care coordination.   Follow Up Plan:   Lauretta Grill Upstream Scheduler

## 2020-07-22 DIAGNOSIS — N184 Chronic kidney disease, stage 4 (severe): Secondary | ICD-10-CM | POA: Diagnosis not present

## 2020-07-22 DIAGNOSIS — E790 Hyperuricemia without signs of inflammatory arthritis and tophaceous disease: Secondary | ICD-10-CM | POA: Diagnosis not present

## 2020-07-22 DIAGNOSIS — I129 Hypertensive chronic kidney disease with stage 1 through stage 4 chronic kidney disease, or unspecified chronic kidney disease: Secondary | ICD-10-CM | POA: Diagnosis not present

## 2020-07-22 DIAGNOSIS — N1370 Vesicoureteral-reflux, unspecified: Secondary | ICD-10-CM | POA: Diagnosis not present

## 2020-07-22 DIAGNOSIS — N2581 Secondary hyperparathyroidism of renal origin: Secondary | ICD-10-CM | POA: Diagnosis not present

## 2020-07-22 DIAGNOSIS — D649 Anemia, unspecified: Secondary | ICD-10-CM | POA: Diagnosis not present

## 2020-07-29 ENCOUNTER — Telehealth: Payer: Self-pay | Admitting: Family Medicine

## 2020-07-29 DIAGNOSIS — N184 Chronic kidney disease, stage 4 (severe): Secondary | ICD-10-CM | POA: Diagnosis not present

## 2020-07-29 NOTE — Progress Notes (Signed)
  Chronic Care Management   Outreach Note  07/29/2020 Name: Yena Tisby MRN: [id number] DOB: Aug 21, 1965  Referred by: Midge Minium, MD Reason for referral : No chief complaint on file.   A second unsuccessful telephone outreach was attempted today. The patient was referred to pharmacist for assistance with care management and care coordination.  Follow Up Plan:   Lauretta Grill Upstream Scheduler

## 2020-07-31 ENCOUNTER — Encounter: Payer: Self-pay | Admitting: *Deleted

## 2020-08-12 DIAGNOSIS — N184 Chronic kidney disease, stage 4 (severe): Secondary | ICD-10-CM | POA: Diagnosis not present

## 2020-08-15 ENCOUNTER — Telehealth: Payer: Self-pay | Admitting: Family Medicine

## 2020-08-15 NOTE — Chronic Care Management (AMB) (Signed)
  Chronic Care Management   Note  08/15/2020 Name: Sherina Stammer MRN: [id number] DOB: March 21, 1965  Glorianne Manchester Torregrossa is a 55 y.o. year old female who is a primary care patient of Birdie Riddle, Aundra Millet, MD. I reached out to [place] by phone today in response to a referral sent by Ms. Glorianne Manchester Geigle's PCP, Midge Minium, MD.   Ms. Benett was given information about Chronic Care Management services today including:  CCM service includes personalized support from designated clinical staff supervised by her physician, including individualized plan of care and coordination with other care providers 24/7 contact phone numbers for assistance for urgent and routine care needs. Service will only be billed when office clinical staff spend 20 minutes or more in a month to coordinate care. Only one practitioner may furnish and bill the service in a calendar month. The patient may stop CCM services at any time (effective at the end of the month) by phone call to the office staff.   Patient agreed to services and verbal consent obtained.   Follow up plan:   Lauretta Grill Upstream Scheduler

## 2020-08-28 ENCOUNTER — Ambulatory Visit (INDEPENDENT_AMBULATORY_CARE_PROVIDER_SITE_OTHER): Payer: Medicare HMO

## 2020-08-28 ENCOUNTER — Telehealth: Payer: Self-pay

## 2020-08-28 DIAGNOSIS — E1122 Type 2 diabetes mellitus with diabetic chronic kidney disease: Secondary | ICD-10-CM

## 2020-08-28 DIAGNOSIS — E039 Hypothyroidism, unspecified: Secondary | ICD-10-CM | POA: Diagnosis not present

## 2020-08-28 DIAGNOSIS — I10 Essential (primary) hypertension: Secondary | ICD-10-CM | POA: Diagnosis not present

## 2020-08-28 DIAGNOSIS — N183 Chronic kidney disease, stage 3 unspecified: Secondary | ICD-10-CM | POA: Diagnosis not present

## 2020-08-28 NOTE — Progress Notes (Signed)
Chronic Care Management Pharmacy Note  08/28/2020 Name:  Marissa Scott MRN:  [id number] DOB:  [birth year]  Recommendations/Changes: Needing updated TSH due to 05/2020 levothyroxine dose reduction from 112 mcg to 100 mcg - future order placed.   Subjective: Marissa Scott is an 55 y.o. year old female who is a primary patient of Tabori, Aundra Millet, MD.  The CCM team was consulted for assistance with disease management and care coordination needs.    Engaged with patient by telephone for initial visit in response to provider referral for pharmacy case management and/or care coordination services.   Consent to Services:  The patient was given the following information about Chronic Care Management services today, agreed to services, and gave verbal consent: 1. CCM service includes personalized support from designated clinical staff supervised by the primary care provider, including individualized plan of care and coordination with other care providers 2. 24/7 contact phone numbers for assistance for urgent and routine care needs. 3. Service will only be billed when office clinical staff spend 20 minutes or more in a month to coordinate care. 4. Only one practitioner may furnish and bill the service in a calendar month. 5.The patient may stop CCM services at any time (effective at the end of the month) by phone call to the office staff. 6. The patient will be responsible for cost sharing (co-pay) of up to 20% of the service fee (after annual deductible is met). Patient agreed to services and consent obtained.  Patient Care Team: Midge Minium, MD as PCP - Cari Caraway, MD as Consulting Physician (Rheumatology) Renato Shin, MD as Consulting Physician (Endocrinology) Jamal Maes, MD as Consulting Physician (Nephrology) Jari Pigg, MD as Consulting Physician (Dermatology) Sheryn Bison, MD as Referring Physician (Dermatology) Linda Hedges, DO as Consulting  Physician (Obstetrics and Gynecology) [name], [facility] as Pharmacist (Pharmacist)  Objective:  Lab Results  Component Value Date   CREATININE 2.64 (H) 05/03/2020   CREATININE 2.49 (H) 01/03/2020   CREATININE 2.49 (H) 09/06/2019    Lab Results  Component Value Date   HGBA1C 5.7 (H) 05/03/2020   Last diabetic Eye exam:  Lab Results  Component Value Date/Time   HMDIABEYEEXA No Retinopathy 08/01/2016 12:00 AM    Last diabetic Foot exam: No results found for: HMDIABFOOTEX      Component Value Date/Time   CHOL 139 05/03/2020 1431   TRIG 111 05/03/2020 1431   HDL 43 (L) 05/03/2020 1431   CHOLHDL 3.2 05/03/2020 1431   VLDL 52.6 (H) 01/03/2020 1436   LDLCALC 77 05/03/2020 1431   LDLDIRECT 153.0 01/03/2020 1436    Hepatic Function Latest Ref Rng & Units 05/03/2020 01/03/2020 09/06/2019  Total Protein 6.1 - 8.1 g/dL 6.9 6.6 6.6  Albumin 3.5 - 5.2 g/dL - 4.1 3.8  AST 10 - 35 U/L _0 ALT 6 - 29 U/L _1 Alk Phosphatase 39 - 117 U/L - 74 76  Total Bilirubin 0.2 - 1.2 mg/dL 0.3 0.3 0.2  Bilirubin, Direct 0.0 - 0.2 mg/dL 0.1 0.1 0.0    Lab Results  Component Value Date/Time   TSH 0.17 (L) 05/03/2020 02:31 PM   TSH 1.00 09/06/2019 10:58 AM    CBC Latest Ref Rng & Units 05/03/2020 09/06/2019 05/12/2019  WBC 3.8 - 10.8 Thousand/uL 9.6 8.6 9.3  Hemoglobin 11.7 - 15.5 g/dL 9.9(L) 11.5(L) 9.8(L)  Hematocrit 35.0 - 45.0 % 31.2(L) 35.9(L) 30.9(L)  Platelets 140 - 400 Thousand/uL 258 298.0 346.0  No results found for: VD25OH  Clinical ASCVD: No  The 10-year ASCVD risk score Mikey Bussing DC Jr., et al., 2013) is: 5.7%   Values used to calculate the score:     Age: 7 years     Sex: Female     Is Non-Hispanic African American: No     Diabetic: Yes     Tobacco smoker: No     Systolic Blood Pressure: 193 mmHg     Is BP treated: Yes     HDL Cholesterol: 43 mg/dL     Total Cholesterol: 139 mg/dL    Other: (CHADS2VASc if Afib, PHQ9 if depression, MMRC or CAT for COPD, ACT,  DEXA)  Social History   Tobacco Use  Smoking Status Never  Smokeless Tobacco Never   BP Readings from Last 3 Encounters:  06/27/20 138/78  05/03/20 (!) 142/68  01/03/20 137/80   Pulse Readings from Last 3 Encounters:  06/27/20 69  05/03/20 (!) 59  01/03/20 74   Wt Readings from Last 3 Encounters:  05/03/20 197 lb (89.4 kg)  01/03/20 196 lb 3.2 oz (89 kg)  09/06/19 188 lb 6 oz (85.4 kg)    Assessment: Review of patient past medical history, allergies, medications, health status, including review of consultants reports, laboratory and other test data, was performed as part of comprehensive evaluation and provision of chronic care management services.   SDOH:  (Social Determinants of Health) assessments and interventions performed: Yes   CCM Care Plan  Allergies  Allergen Reactions   Enalapril Maleate    Penicillins Rash    Medications Reviewed Today     Reviewed by [name], [facility] (Pharmacist) on 08/28/20 at 1127  Med List Status: <None>   Medication Order Taking? Sig Documenting Provider Last Dose Status Informant  acetaminophen (TYLENOL) 650 MG CR tablet [id number]  Take by mouth.  Patient not taking: No sig reported   [provider]  Active   allopurinol (ZYLOPRIM) 100 MG tablet [id number] No   Patient not taking: Reported on 08/28/2020   [provider] Not Taking Active   amLODipine (NORVASC) 5 MG tablet [id number] No TAKE 1 TABLET EVERY DAY  Patient not taking: Reported on 08/28/2020   Midge Minium, MD Not Taking Active   carvedilol (COREG) 12.5 MG tablet [id number] Yes TAKE 1 TABLET TWICE DAILY WITH  MEALS Midge Minium, MD Taking Active   COLCRYS 0.6 MG tablet [id number]  Take 0.6 mg by mouth daily. [provider]  Active   cyclobenzaprine (FLEXERIL) 10 MG tablet [id number]  TAKE 1 TABLET BY MOUTH EVERY 8 HOURS AS NEEDED FOR SPASMS [provider]  Active    Patient not taking:   Discontinued 08/28/20 1124 (No  longer needed (for PRN medications))   furosemide (LASIX) 20 MG tablet [id number]  Take by mouth as needed.  [provider]  Active   gabapentin (NEURONTIN) 100 MG capsule [id number]   [provider]  Active   levothyroxine (SYNTHROID) 100 MCG tablet [id number] Yes Take 1 tablet (100 mcg total) by mouth daily. Midge Minium, MD Taking Active   Multiple Vitamin (MULTI-VITAMINS) TABS [id number]  Take 1 tablet by mouth daily. [provider]  Active Self           Med Note Tamala Julian, JEFFREY W   Thu Oct 11, 2014 10:50 PM) ....    Discontinued 08/28/20 1123 (Patient has not taken in last 30 days)   pioglitazone (ACTOS) 30 MG tablet [id number] Yes TAKE 1  TABLET EVERY DAY Midge Minium, MD Taking Active     Discontinued 08/28/20 1124 (Completed Course)   rosuvastatin (CRESTOR) 10 MG tablet [id number] Yes TAKE 1 TABLET (10 MG TOTAL) BY MOUTH AT BEDTIME. Midge Minium, MD Taking Active   traMADol Veatrice Bourbon) 50 MG tablet [id number]  TAKE 1 TABLET BY MOUTH EVERY 24 HOURS AS NEEDED [provider]  Active   triamcinolone cream (KENALOG) 0.1 % [id number]  Apply 1 application topically 2 (two) times daily. Midge Minium, MD  Active             Patient Active Problem List   Diagnosis Date Noted   Rib pain on right side 03/12/2020   Pain in thoracic spine 07/29/2018   Lumbar spondylosis 06/28/2018   Opioid dependence ([place]) 06/28/2018   Allergic rhinitis 10/25/2017   Genetic testing 05/20/2016   Ganglion cyst 11/26/2015   Anemia, iron deficiency 07/30/2015   Hypothyroidism 11/06/2014   Back pain 10/17/2014   Physical exam 05/07/2014   Polyarthralgia 11/30/2013   Morbid obesity ([place]) 09/22/2012   Type 2 diabetes mellitus with stage 3 chronic kidney disease, without long-term current use of insulin ([place]) 06/02/2011   POLYCYSTIC OVARIES 01/04/2009   Hyperuricemia 10/01/2008   WRIST PAIN, LEFT 06/06/2008   DERMATITIS, SCALP 03/13/2008    Hyperlipidemia associated with type 2 diabetes mellitus ([place]) 01/30/2008   Essential hypertension 01/30/2008   RENAL DISEASE, CHRONIC, STAGE III 01/30/2008   LEG CRAMPS 01/30/2008    Immunization History  Administered Date(s) Administered   Moderna Sars-Covid-2 Vaccination 05/27/2019, 06/25/2019    Conditions to be addressed/monitored: HLD HTN T2DM IDA CKD3 Hypothyroidism  Care Plan : [place]  Updates made by [name], [place] since 08/30/2020 12:00 AM     Problem: HLD HTN T2DM IDA CKD3 Hypothyroidism   Priority: High     Long-Range Goal: Disease management   Start Date: 08/28/2020  Expected End Date: 08/28/2021  This Visit's Progress: On track  Priority: High  Note:    Pharmacist Clinical Goal(s):  Patient will contact provider office for questions/concerns as evidenced notation of same in electronic health record through collaboration with PharmD and provider.   Interventions: 1:1 collaboration with Midge Minium, MD regarding development and update of comprehensive plan of care as evidenced by provider attestation and co-signature Inter-disciplinary care team collaboration (see longitudinal plan of care) Comprehensive medication review performed; medication list updated in electronic medical record  Hyperlipidemia: (LDL goal < 100) -Controlled -Current treatment: Rosuvastatin 10 mg once daily  -Some fill hx gaps, encouraged daily use as some QOD use noted  -Reviewed several questions related to medications and kidney function. All questions answered -Educated on Cholesterol goals;  Benefits of statin for ASCVD risk reduction; -Recommended to continue current medication  Hypothyroidism (Goal: ensure f/u on tsh) -Uncontrolled -Previously dose reduced from levothyroxine 112 mcg to 100 mcg once daily due to oversuppression of TSH. 1 month f/u intended for 06/2020 past due. Patient willing to f/u within next week.  -Current treatment  Levothyroxine  100 mcg -Recommended to continue current medication Counseled on appropriate use of levothyroxine TSH ordered   Diabetes (A1c goal <7%) -Controlled -Medication compliance has previously been an issue, however now consistently using pioglitazone. Most recent a1c down to 5.7% 05/2020 from 6.1% 01/2020. Reports no changes in diet or exercise. -Current medications: Pioglitazone 30 mg once daily  -Previously on/off prednisone  -Medications previously tried: Prandin   -Current home glucose readings fasting glucose: not testing post  prandial glucose: not testing -Denies hypoglycemic/hyperglycemic symptoms -Current exercise: limited by pain, walking as tolerated -Educated on A1c and blood sugar goals; Complications of diabetes including kidney damage, retinal damage, and cardiovascular disease; Exercise goal of 150 minutes per week; Benefits of routine self-monitoring of blood sugar; -Counseled to check feet daily and get yearly eye exams -Counseled on diet and exercise extensively Recommended to continue current medication CPA check in call in 1 month to review home BG and further consider med deescalation/change    Current Barriers:  Unable to independently monitor therapeutic efficacy   Patient Goals/Self-Care Activities Patient will:  - take medications as prescribed collaborate with provider on medication access solutions  Medication Assistance: None required.  Patient affirms current coverage meets needs.  Patient's preferred pharmacy is:  [place], [place] - [address] [place] [address] Phone: [phone] Fax: [phone]  [facility] - [place], [address] [address] Phone: [phone] Fax: [phone]  [facility], [address] [place]  [address] Phone: [phone] Fax: [phone]  No future appointments.  Follow Up:  Patient agrees to Care Plan and Follow-up.  Plan:  CPA patient call 1 month for BG and BP review. Confirm TSH has been rechecked. Schedule RPH f/u three months.  [name], PharmD, CPP Clinical Pharmacist Practitioner  [facility]  [phone]

## 2020-08-28 NOTE — Patient Instructions (Signed)
Marissa Scott,  Thank you for talking with me today. I have included our care plan/goals in the following pages.   Please review and call me at [phone] with any questions.  Thanks! Ellin Mayhew, Pharm.D., BCGP Clinical Pharmacist [facility] [place] [phone] There are no care plans to display for this patient.   The patient was given the following information about Chronic Care Management services today, agreed to services, and gave verbal consent: 1. CCM service includes personalized support from designated clinical staff supervised by the primary care provider, including individualized plan of care and coordination with other care providers 2. 24/7 contact phone numbers for assistance for urgent and routine care needs. 3. Service will only be billed when office clinical staff spend 20 minutes or more in a month to coordinate care. 4. Only one practitioner may furnish and bill the service in a calendar month. 5.The patient may stop CCM services at any time (effective at the end of the month) by phone call to the office staff. 6. The patient will be responsible for cost sharing (co-pay) of up to 20% of the service fee (after annual deductible is met). Patient agreed to services and consent obtained.  The patient verbalized understanding of instructions provided today and agreed to receive a MyChart copy of patient instruction and/or educational materials. Telephone follow up appointment with pharmacy team member scheduled for: See next appointment with "Care Management Staff" under "What's Next" below.   High Cholesterol  High cholesterol is a condition in which the blood has high levels of a white, waxy substance similar to fat (cholesterol). The liver makes all the cholesterol that the body needs. The human body needs small amounts of cholesterol to help build cells. A person gets extra orexcess cholesterol from the food that he or she  eats. The blood carries cholesterol from the liver to the rest of the body. If you have high cholesterol, deposits (plaques) may build up on the walls of your arteries. Arteries are the blood vessels that carry blood away from your heart. These plaques make the arteries narrowand stiff. Cholesterol plaques increase your risk for heart attack and stroke. Work withyour health care provider to keep your cholesterol levels in a healthy range. What increases the risk? The following factors may make you more likely to develop this condition: Eating foods that are high in animal fat (saturated fat) or cholesterol. Being overweight. Not getting enough exercise. A family history of high cholesterol (familial hypercholesterolemia). Use of tobacco products. Having diabetes. What are the signs or symptoms? There are no symptoms of this condition. How is this diagnosed? This condition may be diagnosed based on the results of a blood test. If you are older than 56 years of age, your health care provider may check your cholesterol levels every 4-6 years. You may be checked more often if you have high cholesterol or other risk factors for heart disease. The blood test for cholesterol measures: "Bad" cholesterol, or LDL cholesterol. This is the main type of cholesterol that causes heart disease. The desired level is less than 100 mg/dL. "Good" cholesterol, or HDL cholesterol. HDL helps protect against heart disease by cleaning the arteries and carrying the LDL to the liver for processing. The desired level for HDL is 60 mg/dL or higher. Triglycerides. These are fats that your body can store or burn for energy. The desired level is less than 150 mg/dL. Total cholesterol. This measures the total  amount of cholesterol in your blood and includes LDL, HDL, and triglycerides. The desired level is less than 200 mg/dL. How is this treated? This condition may be treated with: Diet changes. You may be asked to eat  foods that have more fiber and less saturated fats or added sugar. Lifestyle changes. These may include regular exercise, maintaining a healthy weight, and quitting use of tobacco products. Medicines. These are given when diet and lifestyle changes have not worked. You may be prescribed a statin medicine to help lower your cholesterol levels. Follow these instructions at home: Eating and drinking  Eat a healthy, balanced diet. This diet includes: Daily servings of a variety of fresh, frozen, or canned fruits and vegetables. Daily servings of whole grain foods that are rich in fiber. Foods that are low in saturated fats and trans fats. These include poultry and fish without skin, lean cuts of meat, and low-fat dairy products. A variety of fish, especially oily fish that contain omega-3 fatty acids. Aim to eat fish at least 2 times a week. Avoid foods and drinks that have added sugar. Use healthy cooking methods, such as roasting, grilling, broiling, baking, poaching, steaming, and stir-frying. Do not fry your food except for stir-frying.  Lifestyle  Get regular exercise. Aim to exercise for a total of 150 minutes a week. Increase your activity level by doing activities such as gardening, walking, and taking the stairs. Do not use any products that contain nicotine or tobacco, such as cigarettes, e-cigarettes, and chewing tobacco. If you need help quitting, ask your health care provider.  General instructions Take over-the-counter and prescription medicines only as told by your health care provider. Keep all follow-up visits as told by your health care provider. This is important. Where to find more information American Heart Association: www.heart.org National Heart, Lung, and Blood Institute: https://wilson-eaton.com/ Contact a health care provider if: You have trouble achieving or maintaining a healthy diet or weight. You are starting an exercise program. You are unable to stop smoking. Get  help right away if: You have chest pain. You have trouble breathing. You have any symptoms of a stroke. "BE FAST" is an easy way to remember the main warning signs of a stroke: B - Balance. Signs are dizziness, sudden trouble walking, or loss of balance. E - Eyes. Signs are trouble seeing or a sudden change in vision. F - Face. Signs are sudden weakness or numbness of the face, or the face or eyelid drooping on one side. A - Arms. Signs are weakness or numbness in an arm. This happens suddenly and usually on one side of the body. S - Speech. Signs are sudden trouble speaking, slurred speech, or trouble understanding what people say. T - Time. Time to call emergency services. Write down what time symptoms started. You have other signs of a stroke, such as: A sudden, severe headache with no known cause. Nausea or vomiting. Seizure. These symptoms may represent a serious problem that is an emergency. Do not wait to see if the symptoms will go away. Get medical help right away. Call your local emergency services (911 in the [place] Do not drive yourself to the hospital. Summary Cholesterol plaques increase your risk for heart attack and stroke. Work with your health care provider to keep your cholesterol levels in a healthy range. Eat a healthy, balanced diet, get regular exercise, and maintain a healthy weight. Do not use any products that contain nicotine or tobacco, such as cigarettes, e-cigarettes, and chewing tobacco.  Get help right away if you have any symptoms of a stroke. This information is not intended to replace advice given to you by your health care provider. Make sure you discuss any questions you have with your healthcare provider. Document Revised: 12/19/2018 Document Reviewed: 12/19/2018 Elsevier Patient Education  2022 [employer].

## 2020-08-28 NOTE — Chronic Care Management (AMB) (Signed)
Chronic Care Management Pharmacy Assistant   Name: Marissa Scott  MRN: [id number] DOB: Jun 22, 1965  Marissa Scott is an 55 y.o. year old female who presents for her initial CCM visit with the clinical pharmacist.  Reason for Encounter: Initial CCM Visit With Clinical Pharmacist   Conditions to be addressed/monitored: HTN. HDL, DM, Hypothyroidism, CKD, Obesity, Opioid dependence, Pain in thoracic spine  Primary concerns for visit include: HTN. HDL, DM, Hypothyroidism, CKD, Obesity  Recent office visits:  05/03/2020 [facility] (PCP) Midge Minium, MD; Start phentermine at pt's request to help jump start weight loss.  Recent consult visits:  03/12/2020 [facility] ([place]) Bartko MD, Alert K, no further information available.  Hospital visits:  06/27/2020 ED visit for poison ivy dermatitis Started Prednisone taper, Triamcinolone and antihistamine regimen.  Medications: Outpatient Encounter Medications as of 08/28/2020  Medication Sig Note   acetaminophen (TYLENOL) 650 MG CR tablet Take by mouth. (Patient not taking: No sig reported)    allopurinol (ZYLOPRIM) 100 MG tablet     amLODipine (NORVASC) 5 MG tablet TAKE 1 TABLET EVERY DAY    carvedilol (COREG) 12.5 MG tablet TAKE 1 TABLET TWICE DAILY WITH  MEALS    COLCRYS 0.6 MG tablet Take 0.6 mg by mouth daily.    cyclobenzaprine (FLEXERIL) 10 MG tablet TAKE 1 TABLET BY MOUTH EVERY 8 HOURS AS NEEDED FOR SPASMS    fluticasone (FLONASE) 50 MCG/ACT nasal spray Place 2 sprays into both nostrils daily. (Patient not taking: No sig reported)    furosemide (LASIX) 20 MG tablet Take by mouth as needed.     gabapentin (NEURONTIN) 100 MG capsule     levothyroxine (SYNTHROID) 100 MCG tablet Take 1 tablet (100 mcg total) by mouth daily.    Multiple Vitamin (MULTI-VITAMINS) TABS Take 1 tablet by mouth daily. 10/11/2014: ....   phentermine 37.5 MG capsule Take 1 capsule (37.5 mg total) by mouth every morning.     pioglitazone (ACTOS) 30 MG tablet TAKE 1 TABLET EVERY DAY    predniSONE (DELTASONE) 10 MG tablet Take 6 tabs daily x 2 days, 5 tabs daily x 2 days, 4 tabs daily x 2 days, etc    rosuvastatin (CRESTOR) 10 MG tablet TAKE 1 TABLET (10 MG TOTAL) BY MOUTH AT BEDTIME.    traMADol (ULTRAM) 50 MG tablet TAKE 1 TABLET BY MOUTH EVERY 24 HOURS AS NEEDED    triamcinolone cream (KENALOG) 0.1 % Apply 1 application topically 2 (two) times daily.    No facility-administered encounter medications on file as of 08/28/2020.    Current Medications: Prednisone 10 mg last filled 06/27/2020 - finished taking Levothyroxine 100 mcg once a day last filled 05/17/2020 Phentermine 37.5 mg once a day last filled 05/06/2020 - made patient dizzy so she stopped taking. Carvedilol 12.5 mg twice daily last filled 07/15/2020 Amlodipine 5 mg once a day last filled 07/15/2020 - on hold Pioglitazone 30 mg once a day last filled 07/15/2020 Rosuvastatin 10 mg once a day last filled 04/30/2020 Triamcinolone 0.1% cream prn Gabapentin 100 mg once a day last filled 10/19/2019- uses mail order Cyclobenzaprine 10 mg last filled 07/29/2018 - no longer taking  Tramadol 50 mg prn last filled 06/28/2018 Fluticasone 50 mcg/act nasal spray last filled 10/25/2017 - no longer using Allopurinol 100 mg last filled 07/15/2020 - on hold Furosemide 20 mg prn last filled 12/01/2017 Colcrys 0.6 mg prn last filled 09/12/2018 Acetaminophen 650 mg - no longer using, will take OTC 500 mg occasionally Multiple  Vitamin once a day  Patient Questions: Any changes in your medications or health? Patient states Dr. Hollie Salk put two of her medications on hold for now. Amlodipine and Allopurinol.  Any side effects from any medications?  Patient stated "Not that I know of."  Do you have any symptoms or problems not managed by your medications? Patient states she does not have any symptoms or problems that are not managed by her medications.  Any concerns  about your health right now? Patient states she does not have any concerns about her health at this time.  Has your provider asked that you check blood pressure, blood sugar, or follow special diet at home? Patient states Dr. Birdie Riddle asked her to check her blood pressure and blood sugar at home but she does not. Patient states she is trying to follow low potassium diet.  Do you get any type of exercise on a regular basis? Patient states she walks daily.  Can you think of a goal you would like to reach for your health? Patient states she would like to lose weight.  Do you have any problems getting your medications? Patient states she does not have any problems getting her medications.  Is there anything that you would like to discuss during the appointment?  Patient states she would like to discuss her medications.  Please bring medications and supplements to appointment.  Future Appointments  Date Time Provider [place]  08/28/2020 11:00 AM [facility] CCM PHARMACIST [facility]    Star Rating Drugs: Pioglitazone 30 mg last filled 07/15/2020 Rosuvastatin 10 mg last filled 04/30/2020  April D Calhoun, [place] Pharmacist Assistant [phone]

## 2020-09-11 ENCOUNTER — Encounter: Payer: Self-pay | Admitting: Family Medicine

## 2020-09-16 ENCOUNTER — Other Ambulatory Visit: Payer: Self-pay | Admitting: Family Medicine

## 2020-09-17 ENCOUNTER — Telehealth: Payer: Self-pay

## 2020-09-17 NOTE — Progress Notes (Signed)
Chronic Care Management Pharmacy Assistant   Name: Marissa Scott  MRN: [id number] DOB: [birth year]   Reason for Encounter:    General Adherence Call  / Schedule CPP Follow up (Nov/Dec)    Recent office visits:  None noted.  Recent consult visits:  None noted.   Hospital visits: 06/27/20  Medication Reconciliation was completed by comparing discharge summary, patient's EMR and Pharmacy list, and upon discussion with patient.  Admitted to the hospital on 06/27/20 due to [facility] Discharge date was 06/27/20. Discharged from [facility].   New?Medications Started at [facility] Discharge:?? Started redniSONE (DELTASONE) 10 MG tablet Take 6 tabs daily x 2 days, 5 tabs daily x 2 days, 4 tabs daily x 2 days.  Medication Changes at Hospital Discharge: No medication changes were noted.   Medications Discontinued at Hospital Discharge: No medications were discontinued at discharge.  Medications that remain the same after Hospital Discharge:??  All other medications will remain the same.    Medications: Outpatient Encounter Medications as of 09/17/2020  Medication Sig Note   acetaminophen (TYLENOL) 650 MG CR tablet Take by mouth. (Patient not taking: No sig reported)    allopurinol (ZYLOPRIM) 100 MG tablet  (Patient not taking: Reported on 08/28/2020)    amLODipine (NORVASC) 5 MG tablet TAKE 1 TABLET EVERY DAY (Patient not taking: Reported on 08/28/2020)    carvedilol (COREG) 12.5 MG tablet TAKE 1 TABLET TWICE DAILY WITH  MEALS    COLCRYS 0.6 MG tablet Take 0.6 mg by mouth daily.    cyclobenzaprine (FLEXERIL) 10 MG tablet TAKE 1 TABLET BY MOUTH EVERY 8 HOURS AS NEEDED FOR SPASMS    furosemide (LASIX) 20 MG tablet Take by mouth as needed.     gabapentin (NEURONTIN) 100 MG capsule     levothyroxine (SYNTHROID) 100 MCG tablet Take 1 tablet (100 mcg total) by mouth daily.    Multiple Vitamin (MULTI-VITAMINS) TABS Take 1 tablet by mouth daily. 10/11/2014: ....    pioglitazone (ACTOS) 30 MG tablet TAKE 1 TABLET EVERY DAY    rosuvastatin (CRESTOR) 10 MG tablet TAKE 1 TABLET (10 MG TOTAL) BY MOUTH AT BEDTIME.    traMADol (ULTRAM) 50 MG tablet TAKE 1 TABLET BY MOUTH EVERY 24 HOURS AS NEEDED    triamcinolone cream (KENALOG) 0.1 % Apply 1 application topically 2 (two) times daily.    No facility-administered encounter medications on file as of 09/17/2020.   Have you had any problems recently with your health? Patient denied any current concerns. Reported that she is getting voer COVID and still has a cough but is getting better.  Have you had any problems with your pharmacy? Patient denied having any problems with her current pharmacy.  What issues or side effects are you having with your medications? Patient denied having and current side effects or issues with her medications.   What would you like me to pass along to [name], CPP for them to help you with?  Patient reported she will plan to have her labs drawn sometime next week as she is still recovering from [place].  What can we do to take care of you better? Patient did not have any suggestions at this time.   Star Rating Drugs: Rosuvastatin (CRESTOR) 10 MG tablet - last filled 04/30/20 90 days ????   Future Appointments  Date Time Provider [place]  01/15/2021 11:30 AM [facility] CCM PHARMACIST [facility]    Patient scheduled for Follow up CPP phone visit  for : 01/15/21 @ 11:30 am. Patient confirmed appointment date and time.    Jobe Gibbon, [place] Pharmacist Assistant  [phone]  Time Spent: 40 minutes

## 2020-10-19 ENCOUNTER — Telehealth: Payer: Self-pay | Admitting: Family Medicine

## 2020-10-19 NOTE — Telephone Encounter (Signed)
Left message for patient to call back and schedule Medicare Annual Wellness Visit (AWV) either virtually or in office.   Last AWV ;05/23/15 please schedule at anytime with health coach

## 2020-10-23 ENCOUNTER — Other Ambulatory Visit: Payer: Self-pay | Admitting: Obstetrics & Gynecology

## 2020-10-23 DIAGNOSIS — Z1231 Encounter for screening mammogram for malignant neoplasm of breast: Secondary | ICD-10-CM

## 2020-11-26 ENCOUNTER — Other Ambulatory Visit: Payer: Self-pay

## 2020-11-26 ENCOUNTER — Ambulatory Visit
Admission: RE | Admit: 2020-11-26 | Discharge: 2020-11-26 | Disposition: A | Discharge status: Home / Self Care | Payer: Medicare HMO | Source: Ambulatory Visit | Attending: Obstetrics & Gynecology | Admitting: Obstetrics & Gynecology

## 2020-11-26 DIAGNOSIS — Z1231 Encounter for screening mammogram for malignant neoplasm of breast: Secondary | ICD-10-CM | POA: Diagnosis not present

## 2020-11-28 DIAGNOSIS — E790 Hyperuricemia without signs of inflammatory arthritis and tophaceous disease: Secondary | ICD-10-CM | POA: Diagnosis not present

## 2020-11-28 DIAGNOSIS — N2581 Secondary hyperparathyroidism of renal origin: Secondary | ICD-10-CM | POA: Diagnosis not present

## 2020-11-28 DIAGNOSIS — N1370 Vesicoureteral-reflux, unspecified: Secondary | ICD-10-CM | POA: Diagnosis not present

## 2020-11-28 DIAGNOSIS — N184 Chronic kidney disease, stage 4 (severe): Secondary | ICD-10-CM | POA: Diagnosis not present

## 2020-11-28 DIAGNOSIS — I129 Hypertensive chronic kidney disease with stage 1 through stage 4 chronic kidney disease, or unspecified chronic kidney disease: Secondary | ICD-10-CM | POA: Diagnosis not present

## 2020-11-28 DIAGNOSIS — D649 Anemia, unspecified: Secondary | ICD-10-CM | POA: Diagnosis not present

## 2020-12-03 ENCOUNTER — Telehealth: Payer: Self-pay

## 2020-12-03 NOTE — Progress Notes (Signed)
Chronic Care Management Pharmacy Assistant   Name: Marissa Scott  MRN: [id number] DOB: 1965-10-22   Reason for Encounter: Disease State - General Adherence Call     Recent office visits:  None noted.   Recent consult visits:  None noted.  Hospital visits: 06/27/20   Medication Reconciliation was completed by comparing discharge summary, patient's EMR and Pharmacy list, and upon discussion with patient.   Admitted to the hospital on 06/27/20 due to [facility]. Discharge date was 06/27/20. Discharged from [facility].    New?Medications Started at [facility] Discharge:?? Started redniSONE (DELTASONE) 10 MG tablet Take 6 tabs daily x 2 days, 5 tabs daily x 2 days, 4 tabs daily x 2 days.   Medication Changes at Hospital Discharge: No medication changes were noted.    Medications Discontinued at Hospital Discharge: No medications were discontinued at discharge.   Medications that remain the same after Hospital Discharge:??  All other medications will remain the same.     Medications: Outpatient Encounter Medications as of 12/03/2020  Medication Sig Note   acetaminophen (TYLENOL) 650 MG CR tablet Take by mouth. (Patient not taking: No sig reported)    allopurinol (ZYLOPRIM) 100 MG tablet  (Patient not taking: Reported on 08/28/2020)    amLODipine (NORVASC) 5 MG tablet TAKE 1 TABLET EVERY DAY (Patient not taking: Reported on 08/28/2020)    carvedilol (COREG) 12.5 MG tablet TAKE 1 TABLET TWICE DAILY WITH  MEALS    COLCRYS 0.6 MG tablet Take 0.6 mg by mouth daily.    cyclobenzaprine (FLEXERIL) 10 MG tablet TAKE 1 TABLET BY MOUTH EVERY 8 HOURS AS NEEDED FOR SPASMS    furosemide (LASIX) 20 MG tablet Take by mouth as needed.     gabapentin (NEURONTIN) 100 MG capsule     levothyroxine (SYNTHROID) 100 MCG tablet Take 1 tablet (100 mcg total) by mouth daily.    Multiple Vitamin (MULTI-VITAMINS) TABS Take 1 tablet by mouth daily. 10/11/2014: ....    pioglitazone (ACTOS) 30 MG tablet TAKE 1 TABLET EVERY DAY    rosuvastatin (CRESTOR) 10 MG tablet TAKE 1 TABLET (10 MG TOTAL) BY MOUTH AT BEDTIME.    traMADol (ULTRAM) 50 MG tablet TAKE 1 TABLET BY MOUTH EVERY 24 HOURS AS NEEDED    triamcinolone cream (KENALOG) 0.1 % Apply 1 application topically 2 (two) times daily.    No facility-administered encounter medications on file as of 12/03/2020.    Have you had any problems recently with your health? Patient denied any recent problems or concerns with her health.    Have you had any problems with your pharmacy? Patient denied and concerns with her current pharmacy.  What issues or side effects are you having with your medications? Patient denied any side effects or issues with her current medications.  What would you like me to pass along to [name], CPP for them to help you with?  Patient did not have anything to pass along to CPP at this time.   What can we do to take care of you better? Patient did not have any suggestions. She reported being satisfied with her current level of care.   Care Gaps  AWV:  done 10/19/20 Colonoscopy: done 07/27/19 DM Eye Exam: overdue 12/03/2017 DM Foot Exam: overdue 09/05/20 Microalbumin: done 09/06/19 HbgAIC: done 05/03/20 (5.7) DEXA: unknown  Mammogram: done 11/26/20  Star Rating Drugs: rosuvastatin (CRESTOR) 10 MG tablet - last filled - patient using mail order. Denies any lapse in  fill dates.   Future Appointments  Date Time Provider [place]  01/15/2021 11:30 AM [facility] CCM PHARMACIST [facility] [place], [facility] Clinical Pharmacist Assistant  [phone]  Time Spent: 35 minutes

## 2021-01-07 DIAGNOSIS — I120 Hypertensive chronic kidney disease with stage 5 chronic kidney disease or end stage renal disease: Secondary | ICD-10-CM | POA: Diagnosis not present

## 2021-01-07 DIAGNOSIS — Z01818 Encounter for other preprocedural examination: Secondary | ICD-10-CM | POA: Diagnosis not present

## 2021-01-07 DIAGNOSIS — N186 End stage renal disease: Secondary | ICD-10-CM | POA: Diagnosis not present

## 2021-01-07 DIAGNOSIS — Z992 Dependence on renal dialysis: Secondary | ICD-10-CM | POA: Diagnosis not present

## 2021-01-07 DIAGNOSIS — E1122 Type 2 diabetes mellitus with diabetic chronic kidney disease: Secondary | ICD-10-CM | POA: Diagnosis not present

## 2021-01-09 NOTE — Progress Notes (Signed)
Chronic Care Management Pharmacy Note  01/15/2021 Name:  Marissa Scott MRN:  [id number] DOB:  Jan 10, 1966  Recommendations/Changes: Active order for TSH - patient still has not had collected, reminded to go today.  Due for A1c - consider stopping Actos if continues to decrease  Subjective: Marissa Scott is an 55 y.o. year old female who is a primary patient of Tabori, Aundra Millet, MD.  The CCM team was consulted for assistance with disease management and care coordination needs.    Engaged with patient by telephone for follow up visit in response to provider referral for pharmacy case management and/or care coordination services.   Consent to Services:  The patient was given the following information about Chronic Care Management services today, agreed to services, and gave verbal consent: 1. CCM service includes personalized support from designated clinical staff supervised by the primary care provider, including individualized plan of care and coordination with other care providers 2. 24/7 contact phone numbers for assistance for urgent and routine care needs. 3. Service will only be billed when office clinical staff spend 20 minutes or more in a month to coordinate care. 4. Only one practitioner may furnish and bill the service in a calendar month. 5.The patient may stop CCM services at any time (effective at the end of the month) by phone call to the office staff. 6. The patient will be responsible for cost sharing (co-pay) of up to 20% of the service fee (after annual deductible is met). Patient agreed to services and consent obtained.  Patient Care Team: Midge Minium, MD as PCP - Cari Caraway, MD as Consulting Physician (Rheumatology) Renato Shin, MD as Consulting Physician (Endocrinology) Jamal Maes, MD as Consulting Physician (Nephrology) Jari Pigg, MD as Consulting Physician (Dermatology) Sheryn Bison, MD as Referring Physician  (Dermatology) Linda Hedges, DO as Consulting Physician (Obstetrics and Gynecology) Edythe Clarity, [facility] (Pharmacist)  Objective:  Lab Results  Component Value Date   CREATININE 2.64 (H) 05/03/2020   CREATININE 2.49 (H) 01/03/2020   CREATININE 2.49 (H) 09/06/2019    Lab Results  Component Value Date   HGBA1C 5.7 (H) 05/03/2020   Last diabetic Eye exam:  Lab Results  Component Value Date/Time   HMDIABEYEEXA No Retinopathy 08/01/2016 12:00 AM    Last diabetic Foot exam: No results found for: HMDIABFOOTEX      Component Value Date/Time   CHOL 139 05/03/2020 1431   TRIG 111 05/03/2020 1431   HDL 43 (L) 05/03/2020 1431   CHOLHDL 3.2 05/03/2020 1431   VLDL 52.6 (H) 01/03/2020 1436   LDLCALC 77 05/03/2020 1431   LDLDIRECT 153.0 01/03/2020 1436    Hepatic Function Latest Ref Rng & Units 05/03/2020 01/03/2020 09/06/2019  Total Protein 6.1 - 8.1 g/dL 6.9 6.6 6.6  Albumin 3.5 - 5.2 g/dL - 4.1 3.8  AST 10 - 35 U/L '11 9 11  ' ALT 6 - 29 U/L '7 7 9  ' Alk Phosphatase 39 - 117 U/L - 74 76  Total Bilirubin 0.2 - 1.2 mg/dL 0.3 0.3 0.2  Bilirubin, Direct 0.0 - 0.2 mg/dL 0.1 0.1 0.0    Lab Results  Component Value Date/Time   TSH 0.17 (L) 05/03/2020 02:31 PM   TSH 1.00 09/06/2019 10:58 AM    CBC Latest Ref Rng & Units 05/03/2020 09/06/2019 05/12/2019  WBC 3.8 - 10.8 Thousand/uL 9.6 8.6 9.3  Hemoglobin 11.7 - 15.5 g/dL 9.9(L) 11.5(L) 9.8(L)  Hematocrit 35.0 - 45.0 % 31.2(L) 35.9(L) 30.9(L)  Platelets  140 - 400 Thousand/uL 258 298.0 346.0    No results found for: VD25OH  Clinical ASCVD: No  The 10-year ASCVD risk score (Arnett DK, et al., 2019) is: 5.7%   Values used to calculate the score:     Age: 27 years     Sex: Female     Is Non-Hispanic African American: No     Diabetic: Yes     Tobacco smoker: No     Systolic Blood Pressure: 109 mmHg     Is BP treated: Yes     HDL Cholesterol: 43 mg/dL     Total Cholesterol: 139 mg/dL    Other: (CHADS2VASc if Afib, PHQ9 if depression,  MMRC or CAT for COPD, ACT, DEXA)  Social History   Tobacco Use  Smoking Status Never  Smokeless Tobacco Never   BP Readings from Last 3 Encounters:  06/27/20 138/78  05/03/20 (!) 142/68  01/03/20 137/80   Pulse Readings from Last 3 Encounters:  06/27/20 69  05/03/20 (!) 59  01/03/20 74   Wt Readings from Last 3 Encounters:  05/03/20 197 lb (89.4 kg)  01/03/20 196 lb 3.2 oz (89 kg)  09/06/19 188 lb 6 oz (85.4 kg)    Assessment: Review of patient past medical history, allergies, medications, health status, including review of consultants reports, laboratory and other test data, was performed as part of comprehensive evaluation and provision of chronic care management services.   SDOH:  (Social Determinants of Health) assessments and interventions performed: Yes   CCM Care Plan  Allergies  Allergen Reactions   Enalapril Maleate    Penicillins Rash    Medications Reviewed Today     Reviewed by Edythe Clarity, [facility] (Pharmacist) on 01/15/21 at 1348  Med List Status: <None>   Medication Order Taking? Sig Documenting Provider Last Dose Status Informant  acetaminophen (TYLENOL) 650 MG CR tablet [id number]  Take by mouth.  Patient not taking: No sig reported   [provider]  Active   allopurinol (ZYLOPRIM) 100 MG tablet [id number] No   Patient not taking: Reported on 08/28/2020   [provider] Not Taking Active   amLODipine (NORVASC) 5 MG tablet [id number] No TAKE 1 TABLET EVERY DAY  Patient not taking: Reported on 08/28/2020   Midge Minium, MD Not Taking Active   carvedilol (COREG) 12.5 MG tablet [id number]  TAKE 1 TABLET TWICE DAILY WITH  MEALS Midge Minium, MD  Active   COLCRYS 0.6 MG tablet [id number]  Take 0.6 mg by mouth daily. [provider]  Active   cyclobenzaprine (FLEXERIL) 10 MG tablet [id number]  TAKE 1 TABLET BY MOUTH EVERY 8 HOURS AS NEEDED FOR SPASMS [provider]  Active   furosemide (LASIX) 20 MG tablet  [id number]  Take by mouth as needed.  [provider]  Active   gabapentin (NEURONTIN) 100 MG capsule [id number]   [provider]  Active   levothyroxine (SYNTHROID) 100 MCG tablet [id number]  Take 1 tablet (100 mcg total) by mouth daily. Midge Minium, MD  Active   Multiple Vitamin (MULTI-VITAMINS) TABS [id number]  Take 1 tablet by mouth daily. [provider]  Active Self           Med Note Tamala Julian, JEFFREY W   Thu Oct 11, 2014 10:50 PM) ....  pioglitazone (ACTOS) 30 MG tablet [id number]  TAKE 1 TABLET EVERY DAY Midge Minium, MD  Active   rosuvastatin (CRESTOR) 10 MG tablet [id number]  TAKE 1 TABLET (10  MG TOTAL) BY MOUTH AT BEDTIME. Midge Minium, MD  Active   traMADol Veatrice Bourbon) 50 MG tablet [id number]  TAKE 1 TABLET BY MOUTH EVERY 24 HOURS AS NEEDED [provider]  Active   triamcinolone cream (KENALOG) 0.1 % [id number]  Apply 1 application topically 2 (two) times daily. Midge Minium, MD  Active             Patient Active Problem List   Diagnosis Date Noted   Rib pain on right side 03/12/2020   Pain in thoracic spine 07/29/2018   Lumbar spondylosis 06/28/2018   Opioid dependence ([place]) 06/28/2018   Allergic rhinitis 10/25/2017   Genetic testing 05/20/2016   Ganglion cyst 11/26/2015   Anemia, iron deficiency 07/30/2015   Hypothyroidism 11/06/2014   Back pain 10/17/2014   Physical exam 05/07/2014   Polyarthralgia 11/30/2013   Morbid obesity ([place]) 09/22/2012   Type 2 diabetes mellitus with stage 3 chronic kidney disease, without long-term current use of insulin ([place]) 06/02/2011   POLYCYSTIC OVARIES 01/04/2009   Hyperuricemia 10/01/2008   WRIST PAIN, LEFT 06/06/2008   DERMATITIS, SCALP 03/13/2008   Hyperlipidemia associated with type 2 diabetes mellitus ([place]) 01/30/2008   Essential hypertension 01/30/2008   RENAL DISEASE, CHRONIC, STAGE III 01/30/2008   LEG CRAMPS 01/30/2008    Immunization History  Administered Date(s)  Administered   Moderna Sars-Covid-2 Vaccination 05/27/2019, 06/25/2019   Pharmacist Clinical Goal(s):  Patient will contact provider office for questions/concerns as evidenced notation of same in electronic health record through collaboration with PharmD and provider.   Interventions: 1:1 collaboration with Midge Minium, MD regarding development and update of comprehensive plan of care as evidenced by provider attestation and co-signature Inter-disciplinary care team collaboration (see longitudinal plan of care) Comprehensive medication review performed; medication list updated in electronic medical record  Conditions to be addressed/monitored: HLD HTN T2DM IDA CKD3 Hypothyroidism  Care Plan : [place]  Updates made by Edythe Clarity, RPH since 01/15/2021 12:00 AM     Problem: HLD HTN T2DM IDA CKD3 Hypothyroidism   Priority: High     Long-Range Goal: Disease management   Start Date: 08/28/2020  Expected End Date: 08/28/2021  Recent Progress: On track  Priority: High  Note:   Current Barriers:  Unable to independently monitor therapeutic efficacy  Pharmacist Clinical Goal(s):  Patient will contact provider office for questions/concerns as evidenced notation of same in electronic health record through collaboration with PharmD and provider.   Interventions: 1:1 collaboration with Midge Minium, MD regarding development and update of comprehensive plan of care as evidenced by provider attestation and co-signature Inter-disciplinary care team collaboration (see longitudinal plan of care) Comprehensive medication review performed; medication list updated in electronic medical record  Hyperlipidemia: (LDL goal < 100) -Controlled -Current treatment: Rosuvastatin 10 mg once daily  -Some fill hx gaps, encouraged daily use as some QOD use noted  -Reviewed several questions related to medications and kidney function. All questions answered -Educated on  Cholesterol goals;  Benefits of statin for ASCVD risk reduction; -Recommended to continue current medication  Hypothyroidism (Goal: ensure f/u on tsh) -Uncontrolled -Previously dose reduced from levothyroxine 112 mcg to 100 mcg once daily due to oversuppression of TSH. 1 month f/u intended for 06/2020 past due. Patient willing to f/u within next week.  -Current treatment  Levothyroxine 100 mcg -Recommended to continue current medication Counseled on appropriate use of levothyroxine TSH ordered  Update 01/15/21 Patient still taking 169mg dose. Never was able to get  in for labs - still an active order. Asked that she go in to complete this so we can determine if she Is taking correct dose. Will FU after labs completed.  Diabetes (A1c goal <7%) -Controlled -Medication compliance has previously been an issue, however now consistently using pioglitazone. Most recent a1c down to 5.7% 05/2020 from 6.1% 01/2020. Reports no changes in diet or exercise. -Current medications: Pioglitazone 30 mg once daily  -Previously on/off prednisone  -Medications previously tried: Prandin   -Current home glucose readings fasting glucose: not testing post prandial glucose: not testing -Denies hypoglycemic/hyperglycemic symptoms -Current exercise: limited by pain, walking as tolerated -Educated on A1c and blood sugar goals; Complications of diabetes including kidney damage, retinal damage, and cardiovascular disease; Exercise goal of 150 minutes per week; Benefits of routine self-monitoring of blood sugar; -Counseled to check feet daily and get yearly eye exams -Counseled on diet and exercise extensively Recommended to continue current medication CPA check in call in 1 month to review home BG and further consider med deescalation/change  Update 01/15/21 Patient is due for A1c, Eye exam, foot exam Not testing blood sugars at home Last A1c was 5.7 Would recommend checking A1c - count consider stopping  medication if A1c continues to improve. Also recommend scheduled FU with PCP for foot exam and FU labs. Continue same med for now   Patient Goals/Self-Care Activities Patient will:  - take medications as prescribed collaborate with provider on medication access solutions  Plan: Confirm TSH in 30 days CMA, PharmD 6 months        Medication Assistance: None required.  Patient affirms current coverage meets needs.  Patient's preferred pharmacy is:  [place], [place] - [address] [address] Phone: [phone] Fax: [phone]  [facility] - [address] [place], [address] [place] [address] Phone: [phone] Fax: [phone]  [facility] [place], [address] [place] [address] Phone: [phone] Fax: [phone]  No future appointments.   Follow Up:  Patient agrees to Care Plan and Follow-up.  Beverly Milch, PharmD Clinical Pharmacist  [facility] [phone]

## 2021-01-15 ENCOUNTER — Ambulatory Visit (INDEPENDENT_AMBULATORY_CARE_PROVIDER_SITE_OTHER): Payer: Medicare HMO | Admitting: Pharmacist

## 2021-01-15 DIAGNOSIS — E039 Hypothyroidism, unspecified: Secondary | ICD-10-CM

## 2021-01-15 DIAGNOSIS — N183 Chronic kidney disease, stage 3 unspecified: Secondary | ICD-10-CM

## 2021-01-15 NOTE — Patient Instructions (Addendum)
Visit Information   Goals Addressed             This Visit's Progress    Track and Manage My Blood Pressure-Hypertension       Timeframe:  Long-Range Goal Priority:  High Start Date:    01/15/21                         Expected End Date: 07/16/21                    Follow Up Date 04/15/21    - check blood pressure weekly - choose a place to take my blood pressure (home, clinic or office, retail store) - write blood pressure results in a log or diary    Why is this important?   You won't feel high blood pressure, but it can still hurt your blood vessels.  High blood pressure can cause heart or kidney problems. It can also cause a stroke.  Making lifestyle changes like losing a little weight or eating less salt will help.  Checking your blood pressure at home and at different times of the day can help to control blood pressure.  If the doctor prescribes medicine remember to take it the way the doctor ordered.  Call the office if you cannot afford the medicine or if there are questions about it.     Notes:        Patient Care Plan: CCM Pharmacy Care Plan     Problem Identified: HLD HTN T2DM IDA CKD3 Hypothyroidism   Priority: High     Long-Range Goal: Disease management   Start Date: 08/28/2020  Expected End Date: 08/28/2021  Recent Progress: On track  Priority: High  Note:   Current Barriers:  Unable to independently monitor therapeutic efficacy  Pharmacist Clinical Goal(s):  Patient will contact provider office for questions/concerns as evidenced notation of same in electronic health record through collaboration with PharmD and provider.   Interventions: 1:1 collaboration with Midge Minium, MD regarding development and update of comprehensive plan of care as evidenced by provider attestation and co-signature Inter-disciplinary care team collaboration (see longitudinal plan of care) Comprehensive medication review performed; medication list updated in  electronic medical record  Hyperlipidemia: (LDL goal < 100) -Controlled -Current treatment: Rosuvastatin 10 mg once daily  -Some fill hx gaps, encouraged daily use as some QOD use noted  -Reviewed several questions related to medications and kidney function. All questions answered -Educated on Cholesterol goals;  Benefits of statin for ASCVD risk reduction; -Recommended to continue current medication  Hypothyroidism (Goal: ensure f/u on tsh) -Uncontrolled -Previously dose reduced from levothyroxine 112 mcg to 100 mcg once daily due to oversuppression of TSH. 1 month f/u intended for 06/2020 past due. Patient willing to f/u within next week.  -Current treatment  Levothyroxine 100 mcg -Recommended to continue current medication Counseled on appropriate use of levothyroxine TSH ordered  Update 01/15/21 Patient still taking 112mcg dose. Never was able to get in for labs - still an active order. Asked that she go in to complete this so we can determine if she Is taking correct dose. Will FU after labs completed.  Diabetes (A1c goal <7%) -Controlled -Medication compliance has previously been an issue, however now consistently using pioglitazone. Most recent a1c down to 5.7% 05/2020 from 6.1% 01/2020. Reports no changes in diet or exercise. -Current medications: Pioglitazone 30 mg once daily  -Previously on/off prednisone  -Medications previously tried: Prandin   -  Current home glucose readings fasting glucose: not testing post prandial glucose: not testing -Denies hypoglycemic/hyperglycemic symptoms -Current exercise: limited by pain, walking as tolerated -Educated on A1c and blood sugar goals; Complications of diabetes including kidney damage, retinal damage, and cardiovascular disease; Exercise goal of 150 minutes per week; Benefits of routine self-monitoring of blood sugar; -Counseled to check feet daily and get yearly eye exams -Counseled on diet and exercise  extensively Recommended to continue current medication CPA check in call in 1 month to review home BG and further consider med deescalation/change  Update 01/15/21 Patient is due for A1c, Eye exam, foot exam Not testing blood sugars at home Last A1c was 5.7 Would recommend checking A1c - count consider stopping medication if A1c continues to improve. Also recommend scheduled FU with PCP for foot exam and FU labs. Continue same med for now   Patient Goals/Self-Care Activities Patient will:  - take medications as prescribed collaborate with provider on medication access solutions  Plan: Confirm TSH in 30 days CMA, PharmD 6 months        Patient verbalizes understanding of instructions provided today and agrees to view in [place].  Telephone follow up appointment with pharmacy team member scheduled for: 4 months  Edythe Clarity, [place], PharmD Clinical Pharmacist  [facility] [phone]

## 2021-02-01 DIAGNOSIS — E1122 Type 2 diabetes mellitus with diabetic chronic kidney disease: Secondary | ICD-10-CM | POA: Diagnosis not present

## 2021-02-01 DIAGNOSIS — E039 Hypothyroidism, unspecified: Secondary | ICD-10-CM

## 2021-02-01 DIAGNOSIS — N183 Chronic kidney disease, stage 3 unspecified: Secondary | ICD-10-CM | POA: Diagnosis not present

## 2021-02-04 ENCOUNTER — Telehealth: Payer: Self-pay | Admitting: Pharmacist

## 2021-02-04 NOTE — Progress Notes (Signed)
Chronic Care Management Pharmacy Assistant   Name: Marissa Scott  MRN: [id number] DOB: 1965-07-05  Reason for Encounter: Disease State - Hypertension Call     Recent office visits:  None noted.   Recent consult visits:  None noted  Hospital visits:  None in previous 6 months  Medications: Outpatient Encounter Medications as of 02/04/2021  Medication Sig Note   acetaminophen (TYLENOL) 650 MG CR tablet Take by mouth. (Patient not taking: No sig reported)    allopurinol (ZYLOPRIM) 100 MG tablet  (Patient not taking: Reported on 08/28/2020)    amLODipine (NORVASC) 5 MG tablet TAKE 1 TABLET EVERY DAY (Patient not taking: Reported on 08/28/2020)    carvedilol (COREG) 12.5 MG tablet TAKE 1 TABLET TWICE DAILY WITH  MEALS    COLCRYS 0.6 MG tablet Take 0.6 mg by mouth daily.    cyclobenzaprine (FLEXERIL) 10 MG tablet TAKE 1 TABLET BY MOUTH EVERY 8 HOURS AS NEEDED FOR SPASMS    furosemide (LASIX) 20 MG tablet Take by mouth as needed.     gabapentin (NEURONTIN) 100 MG capsule     levothyroxine (SYNTHROID) 100 MCG tablet Take 1 tablet (100 mcg total) by mouth daily.    Multiple Vitamin (MULTI-VITAMINS) TABS Take 1 tablet by mouth daily. 10/11/2014: ....   pioglitazone (ACTOS) 30 MG tablet TAKE 1 TABLET EVERY DAY    rosuvastatin (CRESTOR) 10 MG tablet TAKE 1 TABLET (10 MG TOTAL) BY MOUTH AT BEDTIME.    traMADol (ULTRAM) 50 MG tablet TAKE 1 TABLET BY MOUTH EVERY 24 HOURS AS NEEDED    triamcinolone cream (KENALOG) 0.1 % Apply 1 application topically 2 (two) times daily.    No facility-administered encounter medications on file as of 02/04/2021.   Current antihypertensive regimen:  carvedilol (COREG) 12.5 MG tablet 1 tablet twice daily  amLODipine (NORVASC) 5 MG tablet furosemide (LASIX) 20 MG tablet   How often are you checking your Blood Pressure?  Patient reported she is checking her blood pressures every few days.   Current home BP readings: 130/68 (last week)   What recent  interventions/DTPs have been made by any provider to improve Blood Pressure control since last CPP Visit:  Patient denied any changes in her medications recently.    Any recent hospitalizations or ED visits since last visit with CPP?  Patient has not had any hospitalizations or ED visits since last visit with CPP.   What diet changes have been made to improve Blood Pressure Control?  Patient reported she does not use added salt in her diet.    What exercise is being done to improve your Blood Pressure Control?  Patient reported she has not been as active lately Scott to the cold weather.     Adherence Review: Is the patient currently on ACE/ARB medication? No Does the patient have >5 day gap between last estimated fill dates? No  carvedilol (COREG) 12.5 MG tablet 1 tablet twice daily  - last filled 12/02/20 90 days  amLODipine (NORVASC) 5 MG tablet - last filled 12/10/20 90 days  furosemide (LASIX) 20 MG tablet - last filled not available  Care Gaps  AWV: done 10/19/20 Colonoscopy: done 07/27/19 DM Eye Exam: Scott 12/03/17 DM Foot Exam: Scott 09/05/20 Microalbumin: done 09/06/19 HbgAIC: done 05/03/20 (5.7) DEXA: unknown  Mammogram: done 11/27/19   Star Rating Drugs: rosuvastatin (CRESTOR) 10 MG  - last filled through mail order  Future Appointments  Date Time Provider [place]  07/02/2021  1:45 PM [facility] CCM PHARMACIST  [facility]   Patient has not gotten TSH checked yet Scott to being out of town for the holidays but will plan to get drawn soon.   Jobe Gibbon, [facility] Clinical Pharmacist Assistant  [phone]

## 2021-02-18 ENCOUNTER — Other Ambulatory Visit (INDEPENDENT_AMBULATORY_CARE_PROVIDER_SITE_OTHER): Payer: Medicare HMO

## 2021-02-18 DIAGNOSIS — E039 Hypothyroidism, unspecified: Secondary | ICD-10-CM

## 2021-02-18 DIAGNOSIS — Z124 Encounter for screening for malignant neoplasm of cervix: Secondary | ICD-10-CM | POA: Diagnosis not present

## 2021-02-18 DIAGNOSIS — Z6836 Body mass index (BMI) 36.0-36.9, adult: Secondary | ICD-10-CM | POA: Diagnosis not present

## 2021-02-18 DIAGNOSIS — N189 Chronic kidney disease, unspecified: Secondary | ICD-10-CM | POA: Insufficient documentation

## 2021-02-18 DIAGNOSIS — N185 Chronic kidney disease, stage 5: Secondary | ICD-10-CM | POA: Insufficient documentation

## 2021-02-18 LAB — TSH: TSH: 2.51 u[IU]/mL (ref 0.35–5.50)

## 2021-02-24 LAB — RESULTS CONSOLE HPV: CHL HPV: NEGATIVE

## 2021-02-24 LAB — HM PAP SMEAR

## 2021-02-28 DIAGNOSIS — I129 Hypertensive chronic kidney disease with stage 1 through stage 4 chronic kidney disease, or unspecified chronic kidney disease: Secondary | ICD-10-CM | POA: Diagnosis not present

## 2021-02-28 DIAGNOSIS — E875 Hyperkalemia: Secondary | ICD-10-CM | POA: Diagnosis not present

## 2021-02-28 DIAGNOSIS — D649 Anemia, unspecified: Secondary | ICD-10-CM | POA: Diagnosis not present

## 2021-02-28 DIAGNOSIS — E039 Hypothyroidism, unspecified: Secondary | ICD-10-CM | POA: Diagnosis not present

## 2021-02-28 DIAGNOSIS — N2581 Secondary hyperparathyroidism of renal origin: Secondary | ICD-10-CM | POA: Diagnosis not present

## 2021-02-28 DIAGNOSIS — N1370 Vesicoureteral-reflux, unspecified: Secondary | ICD-10-CM | POA: Diagnosis not present

## 2021-02-28 DIAGNOSIS — N184 Chronic kidney disease, stage 4 (severe): Secondary | ICD-10-CM | POA: Diagnosis not present

## 2021-03-03 ENCOUNTER — Encounter: Payer: Self-pay | Admitting: Family Medicine

## 2021-03-05 ENCOUNTER — Encounter: Payer: Self-pay | Admitting: Family Medicine

## 2021-03-05 ENCOUNTER — Ambulatory Visit: Payer: Medicare HMO | Admitting: Family Medicine

## 2021-03-05 DIAGNOSIS — E1122 Type 2 diabetes mellitus with diabetic chronic kidney disease: Secondary | ICD-10-CM | POA: Diagnosis not present

## 2021-03-05 DIAGNOSIS — N183 Chronic kidney disease, stage 3 unspecified: Secondary | ICD-10-CM | POA: Diagnosis not present

## 2021-03-05 DIAGNOSIS — N184 Chronic kidney disease, stage 4 (severe): Secondary | ICD-10-CM | POA: Insufficient documentation

## 2021-03-05 DIAGNOSIS — Z23 Encounter for immunization: Secondary | ICD-10-CM

## 2021-03-05 MED ORDER — OZEMPIC (0.25 OR 0.5 MG/DOSE) 2 MG/1.5ML ~~LOC~~ SOPN: 0.2500 mg | PEN_INJECTOR | SUBCUTANEOUS | 1 refills | Status: DC

## 2021-03-05 NOTE — Assessment & Plan Note (Signed)
Pt is still following w/ [facility] and is now in the process of a transplant evaluation at [facility].  She needs to have the shingles series, Hep B series, Tdap, and PNA vaccines in order to proceed.  Will start w/ Tdap and PNA today.

## 2021-03-05 NOTE — Assessment & Plan Note (Signed)
No longer following w/ Dr Loanne Drilling.  Most recent A1C 5.6% done at [facility].  Currently on Actos but would like to switch to Ozempic to help w/ weight loss as she prepares for renal transplant.  Due for eye exam- pt to schedule.  No need to repeat labs today.  Will stop Actos and start Ozempic if insurance will cover.  Pt expressed understanding and is in agreement w/ plan.

## 2021-03-05 NOTE — Assessment & Plan Note (Signed)
Pt's BMI is 38.16 but w/ her other medical issues, this qualifies as morbidly obese.  She knows she needs to lose weight to have her kidney transplant.  Given her diabetes and her obesity, it makes sense to start Ozempic to try and control both.  Discussed use, possible side effects, possible insurance difficulties.  Pt would like to proceed.  Will follow.

## 2021-03-05 NOTE — Patient Instructions (Signed)
Follow up in 4-6 weeks to recheck weight loss progress and adjust Ozempic if needed No need for labs today!  Marissa Scott!! Schedule your eye exam at your convenience and have them send me a copy START the Ozempic weekly as directed Call with any questions or concerns You've got this!!!

## 2021-03-05 NOTE — Progress Notes (Signed)
° °  Subjective:    Patient ID: Marissa Scott, female    DOB: 1965/11/11, 56 y.o.   MRN: [id number]  HPI Chronic renal insufficiency- pt is starting the process for kidney donation.  They have provided her w/ a check list of things that need to be done prior to surgery.  Needs Shingles, PNA, Hep B vaccines.  Obesity- pt's current BMI is 38.16.  Transplant team wants her to lose weight prior to surgery.  Pt did not tolerate Phentermine- felt very lightheaded.  Pt is interested in possibility of Ozempic for weight loss.  Currently on Actos which causes weight gain.  DM- chronic problem.  Currently on Actos as this is what her renal function allows.  This causes weight gain and is counter productive in regards to her upcoming kidney transplant.  Due for eye exam.  Follows w/ Nephrology (currently in transplant workup)   Review of Systems For ROS see HPI   This visit occurred during the SARS-CoV-2 public health emergency.  Safety protocols were in place, including screening questions prior to the visit, additional usage of staff PPE, and extensive cleaning of exam room while observing appropriate contact time as indicated for disinfecting solutions.      Objective:   Physical Exam Vitals reviewed.  Constitutional:      General: She is not in acute distress.    Appearance: Normal appearance. She is well-developed. She is obese. She is not ill-appearing.  HENT:     Head: Normocephalic and atraumatic.  Eyes:     Conjunctiva/sclera: Conjunctivae normal.     Pupils: Pupils are equal, round, and reactive to light.  Neck:     Thyroid: No thyromegaly.  Cardiovascular:     Rate and Rhythm: Normal rate and regular rhythm.     Heart sounds: Normal heart sounds. No murmur heard. Pulmonary:     Effort: Pulmonary effort is normal. No respiratory distress.     Breath sounds: Normal breath sounds.  Abdominal:     General: There is no distension.     Palpations: Abdomen is soft.     Tenderness:  There is no abdominal tenderness.  Musculoskeletal:     Cervical back: Normal range of motion and neck supple.  Lymphadenopathy:     Cervical: No cervical adenopathy.  Skin:    General: Skin is warm and dry.  Neurological:     Mental Status: She is alert and oriented to person, place, and time.  Psychiatric:        Behavior: Behavior normal.          Assessment & Plan:

## 2021-03-13 ENCOUNTER — Telehealth: Payer: Self-pay | Admitting: Pharmacist

## 2021-03-13 NOTE — Progress Notes (Signed)
° ° °  Chronic Care Management Pharmacy Assistant   Name: Marissa Scott  MRN: [id number] DOB: December 24, 1965  Reason for Encounter: Disease State - Hypertension Call    Recent office visits:  03/05/21 Annye Asa, MD - Family Medicine - Obesity - Semaglutide,0.25 or 0.5MG /DOS, (OZEMPIC, 0.25 OR 0.5 MG/DOSE,) 2 MG/1.5ML SOPN prescribed. Follow up in 4-6 weeks.   Recent consult visits:  None noted.  Hospital visits:  None in previous 6 months  Medications: Outpatient Encounter Medications as of 03/13/2021  Medication Sig Note   carvedilol (COREG) 12.5 MG tablet TAKE 1 TABLET TWICE DAILY WITH  MEALS    levothyroxine (SYNTHROID) 100 MCG tablet Take 1 tablet (100 mcg total) by mouth daily.    Multiple Vitamin (MULTI-VITAMINS) TABS Take 1 tablet by mouth daily. 10/11/2014: ....   pioglitazone (ACTOS) 30 MG tablet TAKE 1 TABLET EVERY DAY    rosuvastatin (CRESTOR) 10 MG tablet TAKE 1 TABLET (10 MG TOTAL) BY MOUTH AT BEDTIME.    Semaglutide,0.25 or 0.5MG /DOS, (OZEMPIC, 0.25 OR 0.5 MG/DOSE,) 2 MG/1.5ML SOPN Inject 0.25 mg into the skin once a week.    No facility-administered encounter medications on file as of 03/13/2021.    Current antihypertensive regimen:  carvedilol (COREG) 12.5 MG tablet 1 tablet twice daily  amLODipine (NORVASC) 5 MG tablet furosemide (LASIX) 20 MG tablet  How often are you checking your Blood Pressure?  Patient reported checking blood pressures every few days    Current home BP readings: 130/65   What recent interventions/DTPs have been made by any provider to improve Blood Pressure control since last CPP Visit:  Patient denied any recent changes in blood pressure regimen. She recently started Ozempic and reports she is doing well and not having any side effects from it.    Any recent hospitalizations or ED visits since last visit with CPP?  Patient has not had any ED visits or hospitalizations since last CPP visit.    What diet changes have been made to  improve Blood Pressure Control?  Patient stated she does not eat salt on a regular basis.    What exercise is being done to improve your Blood Pressure Control?  Patient states she has not been exercising regularly but she does try to walk occasionally.    Adherence Review: Is the patient currently on ACE/ARB medication? No Does the patient have >5 day gap between last estimated fill dates? No   Care Gaps   AWV: done 10/19/20 Colonoscopy: done 07/27/19 DM Eye Exam: due 12/03/17 DM Foot Exam: due 09/05/20 Microalbumin: done 09/06/19 HbgAIC: done 05/03/20 (5.7) DEXA: unknown  Mammogram: done 11/27/19   Star Rating Drugs: rosuvastatin (CRESTOR) 10 MG  - last filled through mail order  Future Appointments  Date Time Provider [place]  04/16/2021  2:30 PM Midge Minium, MD [facility]  07/02/2021  1:45 PM [facility] PHARMACIST [facility] [place], [place] Clinical Pharmacist Assistant  [phone]

## 2021-04-16 ENCOUNTER — Encounter: Payer: Self-pay | Admitting: Family Medicine

## 2021-04-16 ENCOUNTER — Ambulatory Visit (INDEPENDENT_AMBULATORY_CARE_PROVIDER_SITE_OTHER): Payer: Medicare HMO | Admitting: Family Medicine

## 2021-04-16 DIAGNOSIS — E1122 Type 2 diabetes mellitus with diabetic chronic kidney disease: Secondary | ICD-10-CM

## 2021-04-16 DIAGNOSIS — N183 Chronic kidney disease, stage 3 unspecified: Secondary | ICD-10-CM

## 2021-04-16 MED ORDER — OZEMPIC (0.25 OR 0.5 MG/DOSE) 2 MG/1.5ML ~~LOC~~ SOPN: 0.5000 mg | PEN_INJECTOR | SUBCUTANEOUS | 1 refills | Status: DC

## 2021-04-16 NOTE — Progress Notes (Signed)
? ?  Subjective:  ? ? Patient ID: Marissa Scott, female    DOB: 1965/11/13, 56 y.o.   MRN: [id number] ? ?HPI ?Obesity- pt was started on Ozempic at last visit and has since lost 11 lbs in 6 weeks.  Pt reports feeling very full, very quickly- 'after 1 or 2 bites'.  Has had some AM nausea but this resolves on its own.  Pt is interested in increasing dose. ? ?DM- pt's A1C was 5.7% on 01/22/21.  Started Ozempic on 03/05/21.  Due for eye exam, foot exam, microalbumin.  Denies numbness/tingling of hands/feet.  Denies symptomatic lows.  No sores/blisters/wounds.  Due for eye exam.  No CP, SOB, HAs, visual changes. ? ? ?Review of Systems ?For ROS see HPI  ? ?This visit occurred during the SARS-CoV-2 public health emergency.  Safety protocols were in place, including screening questions prior to the visit, additional usage of staff PPE, and extensive cleaning of exam room while observing appropriate contact time as indicated for disinfecting solutions.   ?   ?Objective:  ? Physical Exam ?Vitals reviewed.  ?Constitutional:   ?   General: She is not in acute distress. ?   Appearance: She is well-developed. She is obese. She is not ill-appearing.  ?HENT:  ?   Head: Normocephalic and atraumatic.  ?Eyes:  ?   Conjunctiva/sclera: Conjunctivae normal.  ?   Pupils: Pupils are equal, round, and reactive to light.  ?Neck:  ?   Thyroid: No thyromegaly.  ?Cardiovascular:  ?   Rate and Rhythm: Normal rate and regular rhythm.  ?   Heart sounds: Normal heart sounds.  ?Pulmonary:  ?   Effort: Pulmonary effort is normal. No respiratory distress.  ?   Breath sounds: Normal breath sounds.  ?Abdominal:  ?   General: There is no distension.  ?   Palpations: Abdomen is soft.  ?   Tenderness: There is no abdominal tenderness.  ?Musculoskeletal:  ?   Cervical back: Normal range of motion and neck supple.  ?   Right lower leg: No edema.  ?   Left lower leg: No edema.  ?Lymphadenopathy:  ?   Cervical: No cervical adenopathy.  ?Skin: ?   General:  Skin is warm and dry.  ?Neurological:  ?   Mental Status: She is alert and oriented to person, place, and time.  ?Psychiatric:     ?   Behavior: Behavior normal.  ? ? ? ? ? ?   ?Assessment & Plan:  ? ? ?

## 2021-04-16 NOTE — Patient Instructions (Signed)
Follow up in 6 weeks to recheck weight loss progress on Ozempic ?We'll notify you of your lab results and make any changes if needed ?INCREASE the Ozempic dose to 0.'5mg'$  weekly ?Schedule your eye exam and have them send me a copy of their report ?Continue to work on low carb diet and regular exercise- you're doing great!!! ?Call with any questions or concerns ?Happy Spring!!! ?

## 2021-04-17 ENCOUNTER — Encounter: Payer: Self-pay | Admitting: Family Medicine

## 2021-04-17 DIAGNOSIS — I129 Hypertensive chronic kidney disease with stage 1 through stage 4 chronic kidney disease, or unspecified chronic kidney disease: Secondary | ICD-10-CM | POA: Diagnosis not present

## 2021-04-17 DIAGNOSIS — D649 Anemia, unspecified: Secondary | ICD-10-CM | POA: Diagnosis not present

## 2021-04-17 DIAGNOSIS — E1122 Type 2 diabetes mellitus with diabetic chronic kidney disease: Secondary | ICD-10-CM | POA: Diagnosis not present

## 2021-04-17 DIAGNOSIS — N184 Chronic kidney disease, stage 4 (severe): Secondary | ICD-10-CM | POA: Diagnosis not present

## 2021-04-17 DIAGNOSIS — N390 Urinary tract infection, site not specified: Secondary | ICD-10-CM | POA: Diagnosis not present

## 2021-04-17 DIAGNOSIS — N2581 Secondary hyperparathyroidism of renal origin: Secondary | ICD-10-CM | POA: Diagnosis not present

## 2021-04-17 DIAGNOSIS — N1370 Vesicoureteral-reflux, unspecified: Secondary | ICD-10-CM | POA: Diagnosis not present

## 2021-04-17 DIAGNOSIS — E872 Acidosis, unspecified: Secondary | ICD-10-CM | POA: Diagnosis not present

## 2021-04-17 LAB — BASIC METABOLIC PANEL
BUN: 43 mg/dL — ABNORMAL HIGH (ref 6–23)
CO2: 19 mEq/L (ref 19–32)
Calcium: 8.3 mg/dL — ABNORMAL LOW (ref 8.4–10.5)
Chloride: 112 mEq/L (ref 96–112)
Creatinine, Ser: 3.39 mg/dL — ABNORMAL HIGH (ref 0.40–1.20)
GFR: 14.52 mL/min — CL (ref >60.00)
Glucose, Bld: 135 mg/dL — ABNORMAL HIGH (ref 70–99)
Potassium: 4.7 mEq/L (ref 3.5–5.1)
Sodium: 141 mEq/L (ref 135–145)

## 2021-04-17 LAB — MICROALBUMIN / CREATININE URINE RATIO
Creatinine,U: 107.8 mg/dL
Microalb Creat Ratio: 260.7 mg/g — ABNORMAL HIGH (ref 0.0–30.0)
Microalb, Ur: 281 mg/dL — ABNORMAL HIGH (ref 0.0–1.9)

## 2021-04-17 LAB — HEMOGLOBIN A1C: Hgb A1c MFr Bld: 5.5 % (ref 4.6–6.5)

## 2021-04-18 NOTE — Progress Notes (Signed)
Labs faxed to Dr Hollie Salk, patient aware ?

## 2021-04-23 ENCOUNTER — Other Ambulatory Visit: Payer: Self-pay | Admitting: Family Medicine

## 2021-05-05 DIAGNOSIS — L409 Psoriasis, unspecified: Secondary | ICD-10-CM | POA: Insufficient documentation

## 2021-05-08 DIAGNOSIS — N184 Chronic kidney disease, stage 4 (severe): Secondary | ICD-10-CM | POA: Diagnosis not present

## 2021-05-13 ENCOUNTER — Other Ambulatory Visit: Payer: Self-pay | Admitting: Family Medicine

## 2021-05-13 DIAGNOSIS — E039 Hypothyroidism, unspecified: Secondary | ICD-10-CM

## 2021-05-28 ENCOUNTER — Ambulatory Visit (INDEPENDENT_AMBULATORY_CARE_PROVIDER_SITE_OTHER): Payer: Medicare HMO | Admitting: Family Medicine

## 2021-05-28 ENCOUNTER — Encounter: Payer: Self-pay | Admitting: Family Medicine

## 2021-05-28 ENCOUNTER — Telehealth: Payer: Self-pay

## 2021-05-28 VITALS — BP 160/88 | HR 81 | Temp 97.7°F | Ht 60.0 in | Wt 180.8 lb

## 2021-05-28 DIAGNOSIS — I10 Essential (primary) hypertension: Secondary | ICD-10-CM | POA: Diagnosis not present

## 2021-05-28 DIAGNOSIS — N1832 Chronic kidney disease, stage 3b: Secondary | ICD-10-CM | POA: Diagnosis not present

## 2021-05-28 DIAGNOSIS — E1122 Type 2 diabetes mellitus with diabetic chronic kidney disease: Secondary | ICD-10-CM | POA: Diagnosis not present

## 2021-05-28 DIAGNOSIS — E559 Vitamin D deficiency, unspecified: Secondary | ICD-10-CM

## 2021-05-28 LAB — BASIC METABOLIC PANEL
BUN: 35 mg/dL — ABNORMAL HIGH (ref 6–23)
CO2: 24 mEq/L (ref 19–32)
Calcium: 7.6 mg/dL — ABNORMAL LOW (ref 8.4–10.5)
Chloride: 110 mEq/L (ref 96–112)
Creatinine, Ser: 3.32 mg/dL — ABNORMAL HIGH (ref 0.40–1.20)
GFR: 14.87 mL/min — CL (ref >60.00)
Glucose, Bld: 118 mg/dL — ABNORMAL HIGH (ref 70–99)
Potassium: 5.2 mEq/L — ABNORMAL HIGH (ref 3.5–5.1)
Sodium: 143 mEq/L (ref 135–145)

## 2021-05-28 LAB — VITAMIN D 25 HYDROXY (VIT D DEFICIENCY, FRACTURES): VITD: 13.85 ng/mL — ABNORMAL LOW (ref 30.00–100.00)

## 2021-05-28 MED ORDER — SEMAGLUTIDE (1 MG/DOSE) 4 MG/3ML ~~LOC~~ SOPN: 1.0000 mg | PEN_INJECTOR | SUBCUTANEOUS | 3 refills | Status: DC

## 2021-05-28 NOTE — Assessment & Plan Note (Signed)
Deteriorated.  Pt's BP is quite high today but she remains asymptomatic.  Had just recently stopped her Amlodipine.  Will restart '5mg'$  daily in addition to her current dose of Carvedilol 12.'5mg'$  BID.  Will continue to follow. ?

## 2021-05-28 NOTE — Assessment & Plan Note (Signed)
Chronic problem.  Has lost 15 lbs since starting Ozempic.  Currently on the 0.'5mg'$  dose but willing to try the '1mg'$  dose to see if she can lose any more weight prior to kidney transplant.  New prescription sent. ?

## 2021-05-28 NOTE — Progress Notes (Signed)
? ?  Subjective:  ? ? Patient ID: Marissa Scott, female    DOB: 12/09/1965, 56 y.o.   MRN: [id number] ? ?HPI ?DM/Obesity- pt is down 15 lbs since starting Ozempic.  Currently on the 0.'5mg'$  weekly dose.  Down 4 lbs since increased dose.  Pt is tolerating the medication w/o difficulty- no nausea. ? ?HTN- BP is elevated today.  Denies any change in activity or excessive stress.  Currently on Coreg 12.'5mg'$  BID.  Was previously on Amlodipine '5mg'$  daily.  Has remaining pills at home.  No CP, SOB, HAs, visual changes, edema. ? ? ?Review of Systems ?For ROS see HPI  ?   ?Objective:  ? Physical Exam ?Vitals reviewed.  ?Constitutional:   ?   General: She is not in acute distress. ?   Appearance: Normal appearance. She is well-developed. She is obese. She is not ill-appearing.  ?HENT:  ?   Head: Normocephalic and atraumatic.  ?Eyes:  ?   Conjunctiva/sclera: Conjunctivae normal.  ?   Pupils: Pupils are equal, round, and reactive to light.  ?Neck:  ?   Thyroid: No thyromegaly.  ?Cardiovascular:  ?   Rate and Rhythm: Normal rate and regular rhythm.  ?   Pulses: Normal pulses.  ?   Heart sounds: Normal heart sounds. No murmur heard. ?Pulmonary:  ?   Effort: Pulmonary effort is normal. No respiratory distress.  ?   Breath sounds: Normal breath sounds.  ?Abdominal:  ?   General: There is no distension.  ?   Palpations: Abdomen is soft.  ?   Tenderness: There is no abdominal tenderness.  ?Musculoskeletal:  ?   Cervical back: Normal range of motion and neck supple.  ?   Right lower leg: No edema.  ?   Left lower leg: No edema.  ?Lymphadenopathy:  ?   Cervical: No cervical adenopathy.  ?Skin: ?   General: Skin is warm and dry.  ?Neurological:  ?   Mental Status: She is alert and oriented to person, place, and time.  ?Psychiatric:     ?   Behavior: Behavior normal.  ? ? ? ? ? ?   ?Assessment & Plan:  ? ? ?

## 2021-05-28 NOTE — Telephone Encounter (Signed)
Critical lab call on pt her GFR is at 14.87 ? ?

## 2021-05-28 NOTE — Patient Instructions (Addendum)
Follow up in June or July to recheck diabetes ?We'll notify you of your lab results and make any changes if needed ?Increase the Ozempic to '1mg'$  weekly ?Restart the Amlodipine '5mg'$  daily to improve BP ?Continue to work on low carb diet and regular exercise to help with weight loss ?Call with any questions or concerns ?Stay Safe!  Stay Healthy! ?Happy Early Birthday!!! ?

## 2021-05-30 ENCOUNTER — Telehealth: Payer: Self-pay

## 2021-05-30 NOTE — Telephone Encounter (Signed)
-----   Message from Midge Minium, MD sent at 05/29/2021  7:23 AM EDT ----- ?Your Cr is stable at 3.3 but your calcium has dropped to 7.6 (down from 8.3).  Your Vit D remains low.  I will forward these labs to Dr Hollie Salk at [facility] for her to review and make changes ?

## 2021-06-10 NOTE — Assessment & Plan Note (Signed)
Ongoing issue for pt.  She lost 11 lbs in 6 weeks on the starting dose of Ozempic.  She would like to increase to 0.'5mg'$  weekly as she is tolerating the injxns w/o difficulty.  New prescription sent to reflect change in dose. ?

## 2021-06-10 NOTE — Assessment & Plan Note (Signed)
Pt due for repeat A1C.  Last A1C 5.7% and in the meantime has started Ozempic.  Need to make sure that sugar isn't dropping too low.  Foot exam done today, microalbumin ordered.  Pt to schedule eye exam. ?

## 2021-06-18 NOTE — Progress Notes (Signed)
Chronic Care Management Pharmacy Note  07/02/2021 Name:  Marissa Scott MRN:  [id number] DOB:  1965-10-21  Recommendations/Changes: TSH normal, doing well on Ozempic.  Lost about 20 lbs!  No changes at this time  Subjective: Marissa Scott is an 56 y.o. year old female who is a primary patient of Tabori, Aundra Millet, MD.  The CCM team was consulted for assistance with disease management and care coordination needs.    Engaged with patient by telephone for follow up visit in response to provider referral for pharmacy case management and/or care coordination services.   Consent to Services:  The patient was given the following information about Chronic Care Management services today, agreed to services, and gave verbal consent: 1. CCM service includes personalized support from designated clinical staff supervised by the primary care provider, including individualized plan of care and coordination with other care providers 2. 24/7 contact phone numbers for assistance for urgent and routine care needs. 3. Service will only be billed when office clinical staff spend 20 minutes or more in a month to coordinate care. 4. Only one practitioner may furnish and bill the service in a calendar month. 5.The patient may stop CCM services at any time (effective at the end of the month) by phone call to the office staff. 6. The patient will be responsible for cost sharing (co-pay) of up to 20% of the service fee (after annual deductible is met). Patient agreed to services and consent obtained.  Patient Care Team: Midge Minium, MD as PCP - General Bo Merino, MD as Consulting Physician (Rheumatology) Renato Shin, MD (Inactive) as Consulting Physician (Endocrinology) Jamal Maes, MD as Consulting Physician (Nephrology) Jari Pigg, MD as Consulting Physician (Dermatology) Sheryn Bison, MD as Referring Physician (Dermatology) Linda Hedges, DO as Consulting Physician  (Obstetrics and Gynecology) Edythe Clarity, [facility] (Pharmacist)   Recent Office Visits: 05/28/21 - Increased Ozempic to 42m weekly.  Still awaiting kidney transplant workup.  Recent GFR of 14.  BP was elevated, she did restart her amlodipine at this visit.  Lab Results  Component Value Date   CREATININE 3.32 (H) 05/28/2021   CREATININE 3.39 (H) 04/16/2021   CREATININE 2.64 (H) 05/03/2020    Lab Results  Component Value Date   HGBA1C 5.5 04/16/2021   Last diabetic Eye exam:  Lab Results  Component Value Date/Time   HMDIABEYEEXA No Retinopathy 08/01/2016 12:00 AM    Last diabetic Foot exam: No results found for: HMDIABFOOTEX      Component Value Date/Time   CHOL 139 05/03/2020 1431   TRIG 111 05/03/2020 1431   HDL 43 (L) 05/03/2020 1431   CHOLHDL 3.2 05/03/2020 1431   VLDL 52.6 (H) 01/03/2020 1436   LDLCALC 77 05/03/2020 1431   LDLDIRECT 153.0 01/03/2020 1436       Latest Ref Rng & Units 05/03/2020    2:31 PM 01/03/2020    2:36 PM 09/06/2019   10:58 AM  Hepatic Function  Total Protein 6.1 - 8.1 g/dL 6.9   6.6   6.6    Albumin 3.5 - 5.2 g/dL  4.1   3.8    AST 10 - 35 U/L '11   9   11    ' ALT 6 - 29 U/L '7   7   9    ' Alk Phosphatase 39 - 117 U/L  74   76    Total Bilirubin 0.2 - 1.2 mg/dL 0.3   0.3   0.2    Bilirubin,  Direct 0.0 - 0.2 mg/dL 0.1   0.1   0.0      Lab Results  Component Value Date/Time   TSH 2.51 02/18/2021 10:23 AM   TSH 0.17 (L) 05/03/2020 02:31 PM       Latest Ref Rng & Units 05/03/2020    2:31 PM 09/06/2019   10:58 AM 05/12/2019   11:15 AM  CBC  WBC 3.8 - 10.8 Thousand/uL 9.6   8.6   9.3    Hemoglobin 11.7 - 15.5 g/dL 9.9   11.5   9.8    Hematocrit 35.0 - 45.0 % 31.2   35.9   30.9    Platelets 140 - 400 Thousand/uL 258   298.0   346.0      Lab Results  Component Value Date/Time   VD25OH 13.85 (L) 05/28/2021 01:50 PM    Clinical ASCVD: No  The 10-year ASCVD risk score (Arnett DK, et al., 2019) is: 12.6%   Values used to calculate the  score:     Age: 89 years     Sex: Female     Is Non-Hispanic African American: No     Diabetic: Yes     Tobacco smoker: No     Systolic Blood Pressure: 413 mmHg     Is BP treated: Yes     HDL Cholesterol: 31 MG/DL     Total Cholesterol: 163 MG/DL    Other: (CHADS2VASc if Afib, PHQ9 if depression, MMRC or CAT for COPD, ACT, DEXA)  Social History   Tobacco Use  Smoking Status Never  Smokeless Tobacco Never   BP Readings from Last 3 Encounters:  05/28/21 (!) 160/88  04/16/21 136/74  03/05/21 126/66   Pulse Readings from Last 3 Encounters:  05/28/21 81  04/16/21 91  03/05/21 60   Wt Readings from Last 3 Encounters:  05/28/21 180 lb 12.8 oz (82 kg)  04/16/21 183 lb 12.8 oz (83.4 kg)  03/05/21 195 lb 6.4 oz (88.6 kg)    Assessment: Review of patient past medical history, allergies, medications, health status, including review of consultants reports, laboratory and other test data, was performed as part of comprehensive evaluation and provision of chronic care management services.   SDOH:  (Social Determinants of Health) assessments and interventions performed: Yes   CCM Care Plan  Allergies  Allergen Reactions   Enalapril Maleate    Penicillins Rash    Medications Reviewed Today     Reviewed by Edythe Clarity, [facility] (Pharmacist) on 07/02/21 at 1408  Med List Status: <None>   Medication Order Taking? Sig Documenting Provider Last Dose Status Informant  calcitRIOL (ROCALTROL) 0.25 MCG capsule [id number] No Take 0.25 mcg by mouth daily. [provider] Taking Active   carvedilol (COREG) 12.5 MG tablet [id number] No TAKE 1 TABLET TWICE DAILY WITH MEALS Midge Minium, MD Taking Active   levothyroxine (SYNTHROID) 100 MCG tablet [id number] No TAKE 1 TABLET EVERY DAY Midge Minium, MD Taking Active   Multiple Vitamin (MULTI-VITAMINS) TABS [id number] No Take 1 tablet by mouth daily. [provider] Taking Active Self           Med Note Tamala Julian,  JEFFREY W   Thu Oct 11, 2014 10:50 PM) ....  rosuvastatin (CRESTOR) 10 MG tablet [id number] No TAKE 1 TABLET AT BEDTIME Midge Minium, MD Taking Active   Semaglutide, 1 MG/DOSE, 4 MG/3ML SOPN [id number]  Inject 1 mg as directed once a week. Midge Minium, MD  Active  Patient Active Problem List   Diagnosis Date Noted   Vitamin D deficiency 05/28/2021   Psoriasis 05/05/2021   CKD stage G4/A3, GFR 15-29 and albumin creatinine ratio >300 mg/g (HCC) 03/05/2021   Rib pain on right side 03/12/2020   Pain in thoracic spine 07/29/2018   Lumbar spondylosis 06/28/2018   Allergic rhinitis 10/25/2017   Genetic testing 05/20/2016   Ganglion cyst 11/26/2015   Anemia, iron deficiency 07/30/2015   Hypothyroidism 11/06/2014   Back pain 10/17/2014   Physical exam 05/07/2014   Polyarthralgia 11/30/2013   Morbid obesity ([place]) 09/22/2012   Type 2 diabetes mellitus with stage 3 chronic kidney disease, without long-term current use of insulin ([place]) 06/02/2011   POLYCYSTIC OVARIES 01/04/2009   Hyperuricemia 10/01/2008   WRIST PAIN, LEFT 06/06/2008   DERMATITIS, SCALP 03/13/2008   Hyperlipidemia associated with type 2 diabetes mellitus ([place]) 01/30/2008   Essential hypertension 01/30/2008   RENAL DISEASE, CHRONIC, STAGE III 01/30/2008   LEG CRAMPS 01/30/2008    Immunization History  Administered Date(s) Administered   Hep A / Hep B 05/22/2021   Moderna Sars-Covid-2 Vaccination 05/27/2019, 06/25/2019   PNEUMOCOCCAL CONJUGATE-20 03/05/2021   Tdap 03/05/2021   Zoster Recombinat (Shingrix) 04/30/2021   Pharmacist Clinical Goal(s):  Patient will contact provider office for questions/concerns as evidenced notation of same in electronic health record through collaboration with PharmD and provider.   Interventions: 1:1 collaboration with Midge Minium, MD regarding development and update of comprehensive plan of care as evidenced by provider attestation and  co-signature Inter-disciplinary care team collaboration (see longitudinal plan of care) Comprehensive medication review performed; medication list updated in electronic medical record  Conditions to be addressed/monitored: HLD HTN T2DM IDA CKD3 Hypothyroidism  Care Plan : [place]  Updates made by Edythe Clarity, RPH since 07/02/2021 12:00 AM     Problem: HLD HTN T2DM IDA CKD3 Hypothyroidism   Priority: High     Long-Range Goal: Disease management   Start Date: 08/28/2020  Expected End Date: 08/28/2021  Recent Progress: On track  Priority: High  Note:   Current Barriers:  Possible BP fluctuation  Pharmacist Clinical Goal(s):  Patient will contact provider office for questions/concerns as evidenced notation of same in electronic health record through collaboration with PharmD and provider.   Interventions: 1:1 collaboration with Midge Minium, MD regarding development and update of comprehensive plan of care as evidenced by provider attestation and co-signature Inter-disciplinary care team collaboration (see longitudinal plan of care) Comprehensive medication review performed; medication list updated in electronic medical record  Hyperlipidemia: (LDL goal < 100) -Controlled -Current treatment: Rosuvastatin 10 mg once daily  -Some fill hx gaps, encouraged daily use as some QOD use noted  -Reviewed several questions related to medications and kidney function. All questions answered -Educated on Cholesterol goals;  Benefits of statin for ASCVD risk reduction; -Recommended to continue current medication  Hypothyroidism (Goal: ensure f/u on tsh) -Uncontrolled -Previously dose reduced from levothyroxine 112 mcg to 100 mcg once daily due to oversuppression of TSH. 1 month f/u intended for 06/2020 past due. Patient willing to f/u within next week.  -Current treatment  Levothyroxine 100 mcg -Recommended to continue current medication Counseled on appropriate  use of levothyroxine TSH ordered  Update 01/15/21 Patient still taking 135mg dose. Never was able to get in for labs - still an active order. Asked that she go in to complete this so we can determine if she Is taking correct dose. Will FU after labs completed.  Update 07/02/21 TSH was normal in January. Continues on same dose taking appropriately. No changes at this time, continue routine screenings of TSH. Take levothyroxine in the morning on empty stomach 30-60 minutes before food/drink.   Diabetes (A1c goal <7%) -Controlled -Medication compliance has previously been an issue, however now consistently using pioglitazone. Most recent a1c down to 5.7% 05/2020 from 6.1% 01/2020. Reports no changes in diet or exercise. -Current medications: Ozempic 45m weekly Appropriate, Effective, Safe, Accessible -Previously on/off prednisone  -Medications previously tried: Prandin   -Current home glucose readings fasting glucose: not testing post prandial glucose: not testing -Denies hypoglycemic/hyperglycemic symptoms -Current exercise: limited by pain, walking as tolerated -Educated on A1c and blood sugar goals; Complications of diabetes including kidney damage, retinal damage, and cardiovascular disease; Exercise goal of 150 minutes per week; Benefits of routine self-monitoring of blood sugar; -Counseled to check feet daily and get yearly eye exams -Counseled on diet and exercise extensively Recommended to continue current medication CPA check in call in 1 month to review home BG and further consider med deescalation/change  Update 01/15/21 Patient is due for A1c, Eye exam, foot exam Not testing blood sugars at home Last A1c was 5.7 Would recommend checking A1c - count consider stopping medication if A1c continues to improve. Also recommend scheduled FU with PCP for foot exam and FU labs. Continue same med for now  Update 07/02/21 Replaced pioglitazone with Ozempic.  She is still  having occasional nausea the day after taking it.  Recommended she eat smaller, low fat meals which should help with nausea.  Copay is affordable at this time.  She is not monitoring sugar at home currently, most recent A1c was 5.5%.  Denies any symptoms of hypoglycemia. She has lost about 20 pounds while taking this! Congratulated on the weight loss, continue positive lifestyle changes. No other changes at this time - reach out if copay changes and you need help with price. Patient Goals/Self-Care Activities Patient will:  - take medications as prescribed collaborate with provider on medication access solutions  Plan: PharmD 1 year FU         Medication Assistance: None required.  Patient affirms current coverage meets needs.  [facility]'s [facility]:  [facility] [place] [address]Phone: [phone]: [phone] [facility] [place] [address]Phone: [phone]: [phone] [facility] [address] [place]Phone: [phone]: [phone]  Future Appointments  Date Time Provider [place] 07/29/2021 10:40 AM TMidge Minium MD [facility]     Follow Up:  Patient agrees to Care Plan and Follow-up.  CBeverly Milch PharmD Clinical Pharmacist  [facility]([phone]

## 2021-07-02 ENCOUNTER — Ambulatory Visit: Payer: Medicare HMO | Admitting: Pharmacist

## 2021-07-02 DIAGNOSIS — E1122 Type 2 diabetes mellitus with diabetic chronic kidney disease: Secondary | ICD-10-CM

## 2021-07-02 DIAGNOSIS — E039 Hypothyroidism, unspecified: Secondary | ICD-10-CM

## 2021-07-02 NOTE — Patient Instructions (Addendum)
Visit Information   Goals Addressed             This Visit's Progress    Track and Manage My Blood Pressure-Hypertension   On track    Timeframe:  Long-Range Goal Priority:  High Start Date:    01/15/21                         Expected End Date: 07/16/21                    Follow Up Date 04/15/21    - check blood pressure weekly - choose a place to take my blood pressure (home, clinic or office, retail store) - write blood pressure results in a log or diary    Why is this important?   You won't feel high blood pressure, but it can still hurt your blood vessels.  High blood pressure can cause heart or kidney problems. It can also cause a stroke.  Making lifestyle changes like losing a little weight or eating less salt will help.  Checking your blood pressure at home and at different times of the day can help to control blood pressure.  If the doctor prescribes medicine remember to take it the way the doctor ordered.  Call the office if you cannot afford the medicine or if there are questions about it.     Notes:        Patient Care Plan: CCM Pharmacy Care Plan     Problem Identified: HLD HTN T2DM IDA CKD3 Hypothyroidism   Priority: High     Long-Range Goal: Disease management   Start Date: 08/28/2020  Expected End Date: 08/28/2021  Recent Progress: On track  Priority: High  Note:   Current Barriers:  Possible BP fluctuation  Pharmacist Clinical Goal(s):  Patient will contact provider office for questions/concerns as evidenced notation of same in electronic health record through collaboration with PharmD and provider.   Interventions: 1:1 collaboration with Midge Minium, MD regarding development and update of comprehensive plan of care as evidenced by provider attestation and co-signature Inter-disciplinary care team collaboration (see longitudinal plan of care) Comprehensive medication review performed; medication list updated in electronic medical  record  Hyperlipidemia: (LDL goal < 100) -Controlled -Current treatment: Rosuvastatin 10 mg once daily  -Some fill hx gaps, encouraged daily use as some QOD use noted  -Reviewed several questions related to medications and kidney function. All questions answered -Educated on Cholesterol goals;  Benefits of statin for ASCVD risk reduction; -Recommended to continue current medication  Hypothyroidism (Goal: ensure f/u on tsh) -Uncontrolled -Previously dose reduced from levothyroxine 112 mcg to 100 mcg once daily due to oversuppression of TSH. 1 month f/u intended for 06/2020 past due. Patient willing to f/u within next week.  -Current treatment  Levothyroxine 100 mcg -Recommended to continue current medication Counseled on appropriate use of levothyroxine TSH ordered  Update 01/15/21 Patient still taking 126mg dose. Never was able to get in for labs - still an active order. Asked that she go in to complete this so we can determine if she Is taking correct dose. Will FU after labs completed.  Update 07/02/21 TSH was normal in January. Continues on same dose taking appropriately. No changes at this time, continue routine screenings of TSH. Take levothyroxine in the morning on empty stomach 30-60 minutes before food/drink.   Diabetes (A1c goal <7%) -Controlled -Medication compliance has previously been an issue, however now consistently using  pioglitazone. Most recent a1c down to 5.7% 05/2020 from 6.1% 01/2020. Reports no changes in diet or exercise. -Current medications: Ozempic '1mg'$  weekly Appropriate, Effective, Safe, Accessible -Previously on/off prednisone  -Medications previously tried: Prandin   -Current home glucose readings fasting glucose: not testing post prandial glucose: not testing -Denies hypoglycemic/hyperglycemic symptoms -Current exercise: limited by pain, walking as tolerated -Educated on A1c and blood sugar goals; Complications of diabetes including kidney  damage, retinal damage, and cardiovascular disease; Exercise goal of 150 minutes per week; Benefits of routine self-monitoring of blood sugar; -Counseled to check feet daily and get yearly eye exams -Counseled on diet and exercise extensively Recommended to continue current medication CPA check in call in 1 month to review home BG and further consider med deescalation/change  Update 01/15/21 Patient is due for A1c, Eye exam, foot exam Not testing blood sugars at home Last A1c was 5.7 Would recommend checking A1c - count consider stopping medication if A1c continues to improve. Also recommend scheduled FU with PCP for foot exam and FU labs. Continue same med for now  Update 07/02/21 Replaced pioglitazone with Ozempic.  She is still having occasional nausea the day after taking it.  Recommended she eat smaller, low fat meals which should help with nausea.  Copay is affordable at this time.  She is not monitoring sugar at home currently, most recent A1c was 5.5%.  Denies any symptoms of hypoglycemia. She has lost about 20 pounds while taking this! Congratulated on the weight loss, continue positive lifestyle changes. No other changes at this time - reach out if copay changes and you need help with price. Patient Goals/Self-Care Activities Patient will:  - take medications as prescribed collaborate with provider on medication access solutions  Plan: PharmD 1 year FU        The patient verbalized understanding of instructions, educational materials, and care plan provided today and DECLINED offer to receive copy of patient instructions, educational materials, and care plan.  Telephone follow up appointment with pharmacy team member scheduled for: 1 year  Edythe Clarity, [place], PharmD Clinical Pharmacist  [facility] [phone]

## 2021-07-08 ENCOUNTER — Encounter: Payer: Self-pay | Admitting: Family Medicine

## 2021-07-09 ENCOUNTER — Telehealth: Payer: Self-pay

## 2021-07-09 NOTE — Telephone Encounter (Signed)
Received a form from [employer] pt asst program. I have filled the form out and placed in Dr Birdie Riddle for signature . Once signed I will fax it back . Pt is aware . Sent a my chart message to pt .

## 2021-07-17 NOTE — Telephone Encounter (Signed)
Spoke w/ pt and advised her that we have not received the medication . I explanted to her that we will notify her once we get the medication

## 2021-07-28 ENCOUNTER — Ambulatory Visit: Payer: Medicare HMO | Admitting: Family Medicine

## 2021-07-29 ENCOUNTER — Ambulatory Visit: Payer: Medicare HMO | Admitting: Family Medicine

## 2021-07-29 ENCOUNTER — Telehealth: Payer: Self-pay

## 2021-07-29 NOTE — Telephone Encounter (Signed)
Spoke w/ pt and advised pt that her Ozempic is here and ready for pick up. They are in my fridge with pt name on it .

## 2021-08-01 ENCOUNTER — Ambulatory Visit (INDEPENDENT_AMBULATORY_CARE_PROVIDER_SITE_OTHER): Payer: Medicare HMO | Admitting: Family Medicine

## 2021-08-01 ENCOUNTER — Telehealth: Payer: Self-pay

## 2021-08-01 ENCOUNTER — Encounter: Payer: Self-pay | Admitting: Family Medicine

## 2021-08-01 VITALS — BP 126/70 | HR 97 | Temp 97.1°F | Resp 16 | Ht 61.0 in | Wt 170.5 lb

## 2021-08-01 DIAGNOSIS — E669 Obesity, unspecified: Secondary | ICD-10-CM

## 2021-08-01 DIAGNOSIS — N1832 Chronic kidney disease, stage 3b: Secondary | ICD-10-CM | POA: Diagnosis not present

## 2021-08-01 DIAGNOSIS — E1122 Type 2 diabetes mellitus with diabetic chronic kidney disease: Secondary | ICD-10-CM | POA: Diagnosis not present

## 2021-08-01 LAB — BASIC METABOLIC PANEL
BUN: 49 mg/dL — ABNORMAL HIGH (ref 6–23)
CO2: 18 mEq/L — ABNORMAL LOW (ref 19–32)
Calcium: 8.5 mg/dL (ref 8.4–10.5)
Chloride: 110 mEq/L (ref 96–112)
Creatinine, Ser: 3.92 mg/dL — ABNORMAL HIGH (ref 0.40–1.20)
GFR: 12.17 mL/min — CL (ref >60.00)
Glucose, Bld: 173 mg/dL — ABNORMAL HIGH (ref 70–99)
Potassium: 4.8 mEq/L (ref 3.5–5.1)
Sodium: 138 mEq/L (ref 135–145)

## 2021-08-01 LAB — HEMOGLOBIN A1C: Hgb A1c MFr Bld: 6 % (ref 4.6–6.5)

## 2021-08-01 NOTE — Telephone Encounter (Signed)
Critical GFR 12.17

## 2021-08-01 NOTE — Progress Notes (Signed)
   Subjective:    Patient ID: Marissa Scott, female    DOB: September 28, 1965, 56 y.o.   MRN: [id number]  HPI DM- chronic problem, currently on Ozempic '1mg'$  dose.  Pt reports feeling 'good'.  UTD on foot exam, microalbumin.  Due for eye exam.  No CP, SOB, HAs, visual changes, abd pain, will have some nausea w/ Ozempic.  Denies symptomatic lows.  No numbness/tingling of hands/feet.  Obesity- pt is down 11 lbs since last visit.  BMI now 32.22.  'i can't eat no less'.   Review of Systems For ROS see HPI     Objective:   Physical Exam Vitals reviewed.  Constitutional:      General: She is not in acute distress.    Appearance: Normal appearance. She is well-developed. She is not ill-appearing.  HENT:     Head: Normocephalic and atraumatic.  Eyes:     Conjunctiva/sclera: Conjunctivae normal.     Pupils: Pupils are equal, round, and reactive to light.  Neck:     Thyroid: No thyromegaly.  Cardiovascular:     Rate and Rhythm: Normal rate and regular rhythm.     Pulses: Normal pulses.     Heart sounds: Normal heart sounds. No murmur heard. Pulmonary:     Effort: Pulmonary effort is normal. No respiratory distress.     Breath sounds: Normal breath sounds.  Abdominal:     General: There is no distension.     Palpations: Abdomen is soft.     Tenderness: There is no abdominal tenderness.  Musculoskeletal:     Cervical back: Normal range of motion and neck supple.     Right lower leg: No edema.     Left lower leg: No edema.  Lymphadenopathy:     Cervical: No cervical adenopathy.  Skin:    General: Skin is warm and dry.  Neurological:     General: No focal deficit present.     Mental Status: She is alert and oriented to person, place, and time.  Psychiatric:        Mood and Affect: Mood normal.        Behavior: Behavior normal.           Assessment & Plan:

## 2021-08-01 NOTE — Assessment & Plan Note (Signed)
Chronic problem.  Currently on Ozempic '1mg'$  weekly and doing well.  UTD on foot exam, microalbumin.  Due for eye exam- pt to schedule.  Check labs.  Adjust meds prn

## 2021-08-01 NOTE — Assessment & Plan Note (Signed)
Pt down 11 lbs since last visit.  Applauded her efforts.  Will continue to follow.

## 2021-08-01 NOTE — Patient Instructions (Addendum)
Schedule your complete physical in 6 months We'll notify you of your lab results and make any changes if needed Keep up the good work on healthy diet and regular physical activity- you're doing great!!! Schedule your eye exam Call with any questions or concerns ENJOY [place]!!!

## 2021-08-06 ENCOUNTER — Telehealth: Payer: Self-pay

## 2021-08-06 NOTE — Telephone Encounter (Signed)
-----   Message from Midge Minium, MD sent at 08/06/2021  7:32 AM EDT ----- Labs are fairly stable but your Creatinine is higher than last check.  Will send to [facility] for them to review and decide on next steps.

## 2021-08-06 NOTE — Telephone Encounter (Signed)
Left pt a detailed vm in regards to lab results

## 2021-08-22 ENCOUNTER — Telehealth: Payer: Self-pay | Admitting: Family Medicine

## 2021-08-22 NOTE — Telephone Encounter (Signed)
Left message for patient to call back and schedule Medicare Annual Wellness Visit (AWV).   Please offer to do virtually or by telephone.  Left office number and my jabber [phone].  Last AWV:05/23/2015  Please schedule at anytime with Nurse Health Advisor.

## 2021-08-26 ENCOUNTER — Telehealth: Payer: Self-pay

## 2021-08-26 NOTE — Patient Outreach (Signed)
  Care Management   Outreach Note  08/26/2021 Name: Richele Strand MRN: [id number] DOB: Apr 13, 1965  An unsuccessful telephone outreach was attempted today. The patient was referred to the case management team for assistance with care management and care coordination.   Follow Up Plan:  The care management team will reach out to the patient again over the next 7 days.   Daneen Schick, BSW, CDP Social Worker, Certified Dementia Practitioner Care Coordination [phone]

## 2021-08-28 ENCOUNTER — Ambulatory Visit: Payer: Self-pay

## 2021-08-28 NOTE — Patient Instructions (Signed)
Visit Information  Thank you for taking time to visit with me today. Please don't hesitate to contact me if I can be of assistance to you.   Following are the goals we discussed today:   Goals Addressed   None     Please call the care guide team at [phone] if you need to schedule an appointment with me.  If you are experiencing a Mental Health or Behavioral Health Crisis or need someone to talk to, please go to [facility] [address], [place] ([phone])  Patient verbalizes understanding of instructions and care plan provided today and agrees to view in MyChart. Active MyChart status and patient understanding of how to access instructions and care plan via MyChart confirmed with patient.     No further follow up required: Please contact me as needed.  Lexys Milliner, BSW, CDP Social Worker, Certified Dementia Practitioner Care Coordination [phone]          

## 2021-08-28 NOTE — Patient Outreach (Signed)
  Care Coordination   Initial Visit Note   08/28/2021 Name: Marissa Scott MRN: [id number] DOB: 05-28-1965  Marissa Scott is a 56 y.o. year old female who sees Tabori, Aundra Millet, MD for primary care. I spoke with  Marissa Scott by phone today  What matters to the patients health and wellness today?  Patient does not have any concerns   Goals Addressed   None     SDOH assessments and interventions completed:   No SDOH Interventions Today    Flowsheet Row Most Recent Value  SDOH Interventions   Food Insecurity Interventions Intervention Not Indicated  Housing Interventions Intervention Not Indicated  Transportation Interventions Intervention Not Indicated       Care Coordination Interventions Activated:  No Care Coordination Interventions:  No, not indicated  Follow up plan: No further intervention required.  Encounter Outcome:  Pt. Visit Completed  Daneen Schick, BSW, CDP Social Worker, Certified Dementia Practitioner Care Coordination [phone]

## 2021-10-03 ENCOUNTER — Telehealth: Payer: Self-pay | Admitting: Pharmacist

## 2021-10-03 NOTE — Progress Notes (Signed)
    Chronic Care Management Pharmacy Assistant   Name: Marissa Scott  MRN: [id number] DOB: 07-11-1965   Reason for Encounter: [place] Call    Recent office visits:  08/22/21 Annual Wellness Visit Completed  08/01/21 Marissa Asa, MD - Diabetes - Labs were ordered. Follow up in 6 months.   Recent consult visits:  None noted  Hospital visits:  None in previous 6 months  Medications: Outpatient Encounter Medications as of 10/03/2021  Medication Sig Note   acetaminophen (TYLENOL) 650 MG CR tablet Take 650 mg by mouth every 6 (six) hours.    calcitRIOL (ROCALTROL) 0.25 MCG capsule Take 0.25 mcg by mouth daily.    carvedilol (COREG) 12.5 MG tablet TAKE 1 TABLET TWICE DAILY WITH MEALS    levothyroxine (SYNTHROID) 100 MCG tablet TAKE 1 TABLET EVERY DAY    Multiple Vitamin (MULTI-VITAMINS) TABS Take 1 tablet by mouth daily. 10/11/2014: ....   rosuvastatin (CRESTOR) 10 MG tablet TAKE 1 TABLET AT BEDTIME    Semaglutide, 1 MG/DOSE, 4 MG/3ML SOPN Inject 1 mg as directed once a week.    No facility-administered encounter medications on file as of 10/03/2021.    Current antihyperglycemic regimen:  Semaglutide, 1 MG/DOSE, 4 MG/3ML SOPN   What recent interventions/DTPs have been made to improve glycemic control:  Patient denied any recent medication changes since last visit with [facility].  Have there been any recent hospitalizations or ED visits since last visit with [facility]?  Patient has not had any hospitalizations or ED visits since last visit with [facility].   Patient  hypoglycemic symptoms, including    Patient  hyperglycemic symptoms, including    How often are you checking your blood sugar? Patient reported checking blood sugars    What are your blood sugars ranging?  Fasting:  Before meals:  After meals:  Bedtime:   During the week, how often does your blood glucose drop below 70?  Patient reported she has not had any readings 70 or lower.   Are you checking  your feet daily/regularly? Patient reported she is checking her feet regularly and does not currently have any concerns.    Adherence Review: Is the patient currently on a STATIN medication? Yes Is the patient currently on ACE/ARB medication? No Does the patient have >5 day gap between last estimated fill dates? No    Care Gaps   AWV: done  08/22/21 Colonoscopy: done 07/26/21 DM Eye Exam: due 12/03/17 DM Foot Exam: due 09/05/20 Microalbumin: done 04/16/21 HbgAIC: done 08/01/21 (6.0) DEXA: unknown  Mammogram: done 11/27/19     Star Rating Drugs:  Rosuvastatin (CRESTOR) 10 MG  - last filled 07/25/21 90 days     Future Appointments  Date Time Provider [place]  02/10/2022 11:00 AM Midge Minium, MD [facility]   Multiple attempts were made to contact patient. Attempts were unsuccessful. / ls,CMA    Jobe Gibbon, [place] Pharmacist Assistant  [phone]    Jobe Gibbon, [place] Pharmacist Assistant  [phone]

## 2021-10-07 ENCOUNTER — Telehealth: Payer: Self-pay

## 2021-10-08 NOTE — Telephone Encounter (Signed)
Dr Birdie Riddle has forms

## 2021-10-13 NOTE — Telephone Encounter (Signed)
Form completed and returned to [facility]

## 2021-10-20 NOTE — Telephone Encounter (Signed)
[facility] faxed form back on 10/14/21

## 2021-10-30 ENCOUNTER — Ambulatory Visit (INDEPENDENT_AMBULATORY_CARE_PROVIDER_SITE_OTHER): Payer: Medicare HMO

## 2021-10-30 ENCOUNTER — Other Ambulatory Visit: Payer: Self-pay | Admitting: Family Medicine

## 2021-10-30 VITALS — Ht 60.0 in | Wt 157.0 lb

## 2021-10-30 DIAGNOSIS — Z1231 Encounter for screening mammogram for malignant neoplasm of breast: Secondary | ICD-10-CM

## 2021-10-30 DIAGNOSIS — Z0000 Encounter for general adult medical examination without abnormal findings: Secondary | ICD-10-CM

## 2021-10-30 NOTE — Patient Instructions (Signed)
Ms. Cottier , Thank you for taking time to come for your Medicare Wellness Visit. I appreciate your ongoing commitment to your health goals. Please review the following plan we discussed and let me know if I can assist you in the future.   These are the goals we discussed:  Goals      Track and Manage My Blood Pressure-Hypertension     Timeframe:  Long-Range Goal Priority:  High Start Date:    01/15/21                         Expected End Date: 07/16/21                    Follow Up Date 04/15/21    - check blood pressure weekly - choose a place to take my blood pressure (home, clinic or office, retail store) - write blood pressure results in a log or diary    Why is this important?   You won't feel high blood pressure, but it can still hurt your blood vessels.  High blood pressure can cause heart or kidney problems. It can also cause a stroke.  Making lifestyle changes like losing a little weight or eating less salt will help.  Checking your blood pressure at home and at different times of the day can help to control blood pressure.  If the doctor prescribes medicine remember to take it the way the doctor ordered.  Call the office if you cannot afford the medicine or if there are questions about it.     Notes:         This is a list of the screening recommended for you and due dates:  Health Maintenance  Topic Date Due   Eye exam for diabetics  12/03/2017   Flu Shot  Never done   Mammogram  11/26/2021   Hemoglobin A1C  01/31/2022   Yearly kidney health urinalysis for diabetes  04/17/2022   Complete foot exam   04/17/2022   Yearly kidney function blood test for diabetes  08/02/2022   Pap Smear  02/19/2024   Colon Cancer Screening  07/26/2024   Tetanus Vaccine  03/06/2031   Zoster (Shingles) Vaccine  Completed   HPV Vaccine  Aged Out   COVID-19 Vaccine  Discontinued   Hepatitis C Screening: USPSTF Recommendation to screen - Ages 62-79 yo.  Discontinued   HIV Screening   Discontinued    Advanced directives: Advance directive discussed with you today. I have provided a copy for you to complete at home and have notarized. Once this is complete please bring a copy in to our office so we can scan it into your chart.   Conditions/risks identified: Aim for 30 minutes of exercise or brisk walking, 6-8 glasses of water, and 5 servings of fruits and vegetables each day.   Next appointment: Follow up in one year for your annual wellness visit.   Preventive Care 40-64 Years, Female Preventive care refers to lifestyle choices and visits with your health care provider that can promote health and wellness. What does preventive care include? A yearly physical exam. This is also called an annual well check. Dental exams once or twice a year. Routine eye exams. Ask your health care provider how often you should have your eyes checked. Personal lifestyle choices, including: Daily care of your teeth and gums. Regular physical activity. Eating a healthy diet. Avoiding tobacco and drug use. Limiting alcohol use. Practicing safe sex.  Taking low-dose aspirin daily starting at age 62. Taking vitamin and mineral supplements as recommended by your health care provider. What happens during an annual well check? The services and screenings done by your health care provider during your annual well check will depend on your age, overall health, lifestyle risk factors, and family history of disease. Counseling  Your health care provider may ask you questions about your: Alcohol use. Tobacco use. Drug use. Emotional well-being. Home and relationship well-being. Sexual activity. Eating habits. Work and work Statistician. Method of birth control. Menstrual cycle. Pregnancy history. Screening  You may have the following tests or measurements: Height, weight, and BMI. Blood pressure. Lipid and cholesterol levels. These may be checked every 5 years, or more frequently if you  are over 34 years old. Skin check. Lung cancer screening. You may have this screening every year starting at age 34 if you have a 30-pack-year history of smoking and currently smoke or have quit within the past 15 years. Fecal occult blood test (FOBT) of the stool. You may have this test every year starting at age 31. Flexible sigmoidoscopy or colonoscopy. You may have a sigmoidoscopy every 5 years or a colonoscopy every 10 years starting at age 8. Hepatitis C blood test. Hepatitis B blood test. Sexually transmitted disease (STD) testing. Diabetes screening. This is done by checking your blood sugar (glucose) after you have not eaten for a while (fasting). You may have this done every 1-3 years. Mammogram. This may be done every 1-2 years. Talk to your health care provider about when you should start having regular mammograms. This may depend on whether you have a family history of breast cancer. BRCA-related cancer screening. This may be done if you have a family history of breast, ovarian, tubal, or peritoneal cancers. Pelvic exam and Pap test. This may be done every 3 years starting at age 67. Starting at age 80, this may be done every 5 years if you have a Pap test in combination with an HPV test. Bone density scan. This is done to screen for osteoporosis. You may have this scan if you are at high risk for osteoporosis. Discuss your test results, treatment options, and if necessary, the need for more tests with your health care provider. Vaccines  Your health care provider may recommend certain vaccines, such as: Influenza vaccine. This is recommended every year. Tetanus, diphtheria, and acellular pertussis (Tdap, Td) vaccine. You may need a Td booster every 10 years. Zoster vaccine. You may need this after age 64. Pneumococcal 13-valent conjugate (PCV13) vaccine. You may need this if you have certain conditions and were not previously vaccinated. Pneumococcal polysaccharide (PPSV23)  vaccine. You may need one or two doses if you smoke cigarettes or if you have certain conditions. Talk to your health care provider about which screenings and vaccines you need and how often you need them. This information is not intended to replace advice given to you by your health care provider. Make sure you discuss any questions you have with your health care provider. Document Released: 02/15/2015 Document Revised: 10/09/2015 Document Reviewed: 11/20/2014 Elsevier Interactive Patient Education  2017 [place] Prevention in the Home Falls can cause injuries. They can happen to people of all ages. There are many things you can do to make your home safe and to help prevent falls. What can I do on the outside of my home? Regularly fix the edges of walkways and driveways and fix any cracks. Remove anything  that might make you trip as you walk through a door, such as a raised step or threshold. Trim any bushes or trees on the path to your home. Use bright outdoor lighting. Clear any walking paths of anything that might make someone trip, such as rocks or tools. Regularly check to see if handrails are loose or broken. Make sure that both sides of any steps have handrails. Any raised decks and porches should have guardrails on the edges. Have any leaves, snow, or ice cleared regularly. Use sand or salt on walking paths during winter. Clean up any spills in your garage right away. This includes oil or grease spills. What can I do in the bathroom? Use night lights. Install grab bars by the toilet and in the tub and shower. Do not use towel bars as grab bars. Use non-skid mats or decals in the tub or shower. If you need to sit down in the shower, use a plastic, non-slip stool. Keep the floor dry. Clean up any water that spills on the floor as soon as it happens. Remove soap buildup in the tub or shower regularly. Attach bath mats securely with double-sided non-slip rug tape. Do  not have throw rugs and other things on the floor that can make you trip. What can I do in the bedroom? Use night lights. Make sure that you have a light by your bed that is easy to reach. Do not use any sheets or blankets that are too big for your bed. They should not hang down onto the floor. Have a firm chair that has side arms. You can use this for support while you get dressed. Do not have throw rugs and other things on the floor that can make you trip. What can I do in the kitchen? Clean up any spills right away. Avoid walking on wet floors. Keep items that you use a lot in easy-to-reach places. If you need to reach something above you, use a strong step stool that has a grab bar. Keep electrical cords out of the way. Do not use floor polish or wax that makes floors slippery. If you must use wax, use non-skid floor wax. Do not have throw rugs and other things on the floor that can make you trip. What can I do with my stairs? Do not leave any items on the stairs. Make sure that there are handrails on both sides of the stairs and use them. Fix handrails that are broken or loose. Make sure that handrails are as long as the stairways. Check any carpeting to make sure that it is firmly attached to the stairs. Fix any carpet that is loose or worn. Avoid having throw rugs at the top or bottom of the stairs. If you do have throw rugs, attach them to the floor with carpet tape. Make sure that you have a light switch at the top of the stairs and the bottom of the stairs. If you do not have them, ask someone to add them for you. What else can I do to help prevent falls? Wear shoes that: Do not have high heels. Have rubber bottoms. Are comfortable and fit you well. Are closed at the toe. Do not wear sandals. If you use a stepladder: Make sure that it is fully opened. Do not climb a closed stepladder. Make sure that both sides of the stepladder are locked into place. Ask someone to hold it for  you, if possible. Clearly mark and make sure that you can  see: Any grab bars or handrails. First and last steps. Where the edge of each step is. Use tools that help you move around (mobility aids) if they are needed. These include: Canes. Walkers. Scooters. Crutches. Turn on the lights when you go into a dark area. Replace any light bulbs as soon as they burn out. Set up your furniture so you have a clear path. Avoid moving your furniture around. If any of your floors are uneven, fix them. If there are any pets around you, be aware of where they are. Review your medicines with your doctor. Some medicines can make you feel dizzy. This can increase your chance of falling. Ask your doctor what other things that you can do to help prevent falls. This information is not intended to replace advice given to you by your health care provider. Make sure you discuss any questions you have with your health care provider. Document Released: 11/15/2008 Document Revised: 06/27/2015 Document Reviewed: 02/23/2014 Elsevier Interactive Patient Education  2017 [employer].

## 2021-10-30 NOTE — Progress Notes (Signed)
Subjective:   Marissa Scott is a 56 y.o. female who presents for Medicare Annual (Subsequent) preventive examination.   Virtual Visit via Telephone Note  I connected with  [name] on 10/30/21 at  3:00 PM EDT by telephone and verified that I am speaking with the correct person using two identifiers.  Location: Patient: home  Provider: [place]  Persons participating in the virtual visit: patient/Nurse Health Advisor   I discussed the limitations, risks, security and privacy concerns of performing an evaluation and management service by telephone and the availability of in person appointments. The patient expressed understanding and agreed to proceed.  Interactive audio and video telecommunications were attempted between this nurse and patient, however failed, due to patient having technical difficulties OR patient did not have access to video capability.  We continued and completed visit with audio only.  Some vital signs may be absent or patient reported.   [name], LPN  Review of Systems     Cardiac Risk Factors include: advanced age (>1mn, >>46women)     Objective:    Today's Vitals   10/30/21 1428  Weight: 157 lb (71.2 kg)  Height: 5' (1.524 m)   Body mass index is 30.66 kg/m.     10/30/2021    2:32 PM 08/28/2021    9:37 AM 10/11/2014   10:24 PM 02/19/2014    2:29 PM  Advanced Directives  Does Patient Have a Medical Advance Directive? No No No No  Would patient like information on creating a medical advance directive? No - Patient declined No - Patient declined      Current Medications (verified) Outpatient Encounter Medications as of 10/30/2021  Medication Sig   acetaminophen (TYLENOL) 650 MG CR tablet Take 650 mg by mouth every 6 (six) hours.   carvedilol (COREG) 12.5 MG tablet TAKE 1 TABLET TWICE DAILY WITH MEALS   levothyroxine (SYNTHROID) 100 MCG tablet TAKE 1 TABLET EVERY DAY   Multiple Vitamin (MULTI-VITAMINS) TABS Take 1  tablet by mouth daily.   rosuvastatin (CRESTOR) 10 MG tablet TAKE 1 TABLET AT BEDTIME   Semaglutide, 1 MG/DOSE, 4 MG/3ML SOPN Inject 1 mg as directed once a week.   calcitRIOL (ROCALTROL) 0.25 MCG capsule Take 0.25 mcg by mouth daily. (Patient not taking: Reported on 10/30/2021)   No facility-administered encounter medications on file as of 10/30/2021.    Allergies (verified) Eggs or egg-derived products, Enalapril maleate, Penicillin g, and Penicillins   History: Past Medical History:  Diagnosis Date   Anemia    Arthritis    DIABETES MELLITUS, TYPE II 01/30/2008   HYPERLIPIDEMIA 01/30/2008   HYPERTENSION 01/30/2008   HYPERURICEMIA 10/01/2008   Hypothyroidism    Obesity    Polycystic ovaries 01/04/2009   RENAL DISEASE, CHRONIC, STAGE III 01/30/2008   Follows w/ Renal   Past Surgical History:  Procedure Laterality Date   APPENDECTOMY     COLONOSCOPY  03/05/2014   ESOPHAGOGASTRODUODENOSCOPY     LUMBAR FUSION     L4-5   LUMBAR LAMINECTOMY     L4-L5   NEPHRECTOMY     left removed, partial right-Ottelin   Neural Stimulator     POLYPECTOMY     Right hand cyst     TUBAL LIGATION     GYN Mezer   Family History  Problem Relation Age of Onset   Hypertension Father    Heart disease Father        CAD   Diabetes Father  Stroke Father    Colon cancer Father 64   Colon polyps Father    Colon cancer Cousin 43       Lynch Syndrome   Stomach cancer Cousin 54   Kidney cancer Cousin    Lung cancer Maternal Aunt 60       history of smoking   Esophageal cancer Neg Hx    Rectal cancer Neg Hx    Social History   Socioeconomic History   Marital status: Married    Spouse name: Not on file   Number of children: 1   Years of education: Not on file   Highest education level: Not on file  Occupational History   Occupation: Disabled  Tobacco Use   Smoking status: Never   Smokeless tobacco: Never  Vaping Use   Vaping Use: Never used  Substance and Sexual Activity   Alcohol  use: Yes    Comment: rarely   Drug use: No   Sexual activity: Yes  Other Topics Concern   Not on file  Social History Narrative   Disabled for back pain   Social Determinants of Health   Financial Resource Strain: Low Risk  (10/30/2021)   Overall Financial Resource Strain (CARDIA)    Difficulty of Paying Living Expenses: Not hard at all  Food Insecurity: No Food Insecurity (10/30/2021)   Hunger Vital Sign    Worried About Running Out of Food in the Last Year: Never true    Ran Out of Food in the Last Year: Never true  Transportation Needs: No Transportation Needs (10/30/2021)   PRAPARE - Hydrologist (Medical): No    Lack of Transportation (Non-Medical): No  Physical Activity: Insufficiently Active (10/30/2021)   Exercise Vital Sign    Days of Exercise per Week: 3 days    Minutes of Exercise per Session: 30 min  Stress: No Stress Concern Present (10/30/2021)   [place]    Feeling of Stress : Not at all  Social Connections: [employer] (10/30/2021)   Social Connection and Isolation Panel [NHANES]    Frequency of Communication with Friends and Family: More than three times a week    Frequency of Social Gatherings with Friends and Family: More than three times a week    Attends Religious Services: More than 4 times per year    Active Member of [employer] or Organizations: Yes    Attends Music therapist: More than 4 times per year    Marital Status: Married    Tobacco Counseling Counseling given: Not Answered   Clinical Intake:  Pre-visit preparation completed: Yes  Pain : No/denies pain     Nutritional Risks: None Diabetes: No  How often do you need to have someone help you when you read instructions, pamphlets, or other written materials from your doctor or pharmacy?: 1 - Never  Diabetic?yes Nutrition Risk Assessment:  Has the patient had any N/V/D within  the last 2 months?  No  Does the patient have any non-healing wounds?  No  Has the patient had any unintentional weight loss or weight gain?  No   Diabetes:  Is the patient diabetic?  Yes  If diabetic, was a CBG obtained today?  No  Did the patient bring in their glucometer from home?  No  How often do you monitor your CBG's? Occasional .   Financial Strains and Diabetes Management:  Are you having any financial strains with the  device, your supplies or your medication? No .  Does the patient want to be seen by Chronic Care Management for management of their diabetes?  No  Would the patient like to be referred to a Nutritionist or for Diabetic Management?  No   Diabetic Exams:  Diabetic Eye Exam: Overdue for diabetic eye exam. Pt has been advised about the importance in completing this exam. Patient advised to call and schedule an eye exam. Diabetic Foot Exam: Overdue, Pt has been advised about the importance in completing this exam. Pt is scheduled for diabetic foot exam on next office visit .   Interpreter Needed?: No  Information entered by :: Jadene Pierini, LPN   Activities of Daily Living    10/30/2021    2:33 PM  In your present state of health, do you have any difficulty performing the following activities:  Hearing? 0  Vision? 0  Difficulty concentrating or making decisions? 0  Walking or climbing stairs? 0  Dressing or bathing? 0  Doing errands, shopping? 0  Preparing Food and eating ? N  Using the Toilet? N  In the past six months, have you accidently leaked urine? N  Do you have problems with loss of bowel control? N  Managing your Medications? N  Managing your Finances? N  Housekeeping or managing your Housekeeping? N    Patient Care Team: Midge Minium, MD as PCP - General Bo Merino, MD as Consulting Physician (Rheumatology) Renato Shin, MD (Inactive) as Consulting Physician (Endocrinology) Jamal Maes, MD as Consulting Physician  (Nephrology) Jari Pigg, MD as Consulting Physician (Dermatology) Sheryn Bison, MD as Referring Physician (Dermatology) Linda Hedges, DO as Consulting Physician (Obstetrics and Gynecology) Edythe Clarity, [facility] (Pharmacist)  Indicate any recent Medical Services you may have received from other than [facility] providers in the past year (date may be approximate).     Assessment:   This is a routine wellness examination for [place].  Hearing/Vision screen Vision Screening - Comments:: Due annual eye exam patient to schedule   Dietary issues and exercise activities discussed: Current Exercise Habits: Home exercise routine, Type of exercise: walking, Time (Minutes): 30, Frequency (Times/Week): 3, Weekly Exercise (Minutes/Week): 90, Intensity: Mild, Exercise limited by: None identified   Goals Addressed             This Visit's Progress    Track and Manage My Blood Pressure-Hypertension   On track    Timeframe:  Long-Range Goal Priority:  High Start Date:    01/15/21                         Expected End Date: 07/16/21                    Follow Up Date 04/15/21    - check blood pressure weekly - choose a place to take my blood pressure (home, clinic or office, retail store) - write blood pressure results in a log or diary    Why is this important?   You won't feel high blood pressure, but it can still hurt your blood vessels.  High blood pressure can cause heart or kidney problems. It can also cause a stroke.  Making lifestyle changes like losing a little weight or eating less salt will help.  Checking your blood pressure at home and at different times of the day can help to control blood pressure.  If the doctor prescribes medicine remember to take it the way  the doctor ordered.  Call the office if you cannot afford the medicine or if there are questions about it.     Notes:        Depression Screen    10/30/2021    2:31 PM 08/01/2021   10:33 AM 05/28/2021    1:26 PM  04/16/2021    2:31 PM 03/05/2021    1:56 PM 05/03/2020    2:05 PM 01/03/2020    1:54 PM  PHQ 2/9 Scores  PHQ - 2 Score 0 0 0 0 0 0 0  PHQ- 9 Score  0 1 1 0 0 0    Fall Risk    10/30/2021    2:30 PM 08/01/2021   10:33 AM 05/28/2021    1:21 PM 04/16/2021    2:32 PM 03/05/2021    1:56 PM  [place] in the past year? 0  1 0 1  Number falls in past yr: 0 0 1  0  Injury with Fall? 0 0 1  0  Risk for fall due to : No Fall Risks No Fall Risks History of fall(s) No Fall Risks History of fall(s)  Follow up Falls prevention discussed Falls evaluation completed Falls evaluation completed Falls evaluation completed Falls evaluation completed    [place]:  Any stairs in or around the home? No  If so, are there any without handrails? No  Home free of loose throw rugs in walkways, pet beds, electrical cords, etc? Yes  Adequate lighting in your home to reduce risk of falls? Yes   ASSISTIVE DEVICES UTILIZED TO PREVENT FALLS:  Life alert? No  Use of a cane, walker or w/c? No  Grab bars in the bathroom? No  Shower chair or bench in shower? No  Elevated toilet seat or a handicapped toilet? No        10/30/2021    2:33 PM  6CIT Screen  What Year? 0 points  What month? 0 points  What time? 0 points  Count back from 20 0 points  Months in reverse 0 points  Repeat phrase 0 points  Total Score 0 points    Immunizations Immunization History  Administered Date(s) Administered   Hep A / Hep B 03/24/2021, 05/22/2021   Moderna Sars-Covid-2 Vaccination 05/27/2019, 06/25/2019   PNEUMOCOCCAL CONJUGATE-20 03/05/2021   Tdap 03/05/2021   Zoster Recombinat (Shingrix) 04/30/2021, 07/01/2021    TDAP status: Up to date  Flu Vaccine status: Declined, Education has been provided regarding the importance of this vaccine but patient still declined. Advised may receive this vaccine at local pharmacy or Health Dept. Aware to provide a copy of the vaccination record if  obtained from local pharmacy or Health Dept. Verbalized acceptance and understanding.  Pneumococcal vaccine status: Up to date  Covid-19 vaccine status: Completed vaccines  Qualifies for Shingles Vaccine? Yes   Zostavax completed Yes   Shingrix Completed?: Yes  Screening Tests Health Maintenance  Topic Date Due   OPHTHALMOLOGY EXAM  12/03/2017   INFLUENZA VACCINE  Never done   MAMMOGRAM  11/26/2021   HEMOGLOBIN A1C  01/31/2022   Diabetic kidney evaluation - Urine ACR  04/17/2022   FOOT EXAM  04/17/2022   Diabetic kidney evaluation - GFR measurement  08/02/2022   PAP SMEAR-Modifier  02/19/2024   COLONOSCOPY (Pts 45-51yr Insurance coverage will need to be confirmed)  07/26/2024   TETANUS/TDAP  03/06/2031   Zoster Vaccines- Shingrix  Completed   HPV VACCINES  Aged  Out   COVID-19 Vaccine  Discontinued   Hepatitis C Screening  Discontinued   HIV Screening  Discontinued    Health Maintenance  Health Maintenance Due  Topic Date Due   OPHTHALMOLOGY EXAM  12/03/2017   INFLUENZA VACCINE  Never done    Colorectal cancer screening: Type of screening: Colonoscopy. Completed 07/27/2019. Repeat every 5 years  Mammogram status: Completed 11/26/2020. Repeat every year  Bone Density status: Ordered not of age . Pt provided with contact info and advised to call to schedule appt.  Lung Cancer Screening: (Low Dose CT Chest recommended if Age 73-80 years, 30 pack-year currently smoking OR have quit w/in 15years.) does not qualify.   Lung Cancer Screening Referral: n/a  Additional Screening:  Hepatitis C Screening: does not qualify;   Vision Screening: Recommended annual ophthalmology exams for early detection of glaucoma and other disorders of the eye. Is the patient up to date with their annual eye exam?  Yes  Who is the provider or what is the name of the office in which the patient attends annual eye exams? Dr.Shipiro  If pt is not established with a provider, would they like to  be referred to a provider to establish care? No .   Dental Screening: Recommended annual dental exams for proper oral hygiene  Community Resource Referral / Chronic Care Management: CRR required this visit?  No   CCM required this visit?  No      Plan:     I have personally reviewed and noted the following in the patient's chart:   Medical and social history Use of alcohol, tobacco or illicit drugs  Current medications and supplements including opioid prescriptions. Patient is not currently taking opioid prescriptions. Functional ability and status Nutritional status Physical activity Advanced directives List of other physicians Hospitalizations, surgeries, and ER visits in previous 12 months Vitals Screenings to include cognitive, depression, and falls Referrals and appointments  In addition, I have reviewed and discussed with patient certain preventive protocols, quality metrics, and best practice recommendations. A written personalized care plan for preventive services as well as general preventive health recommendations were provided to patient.     [name], LPN   [id number]   Nurse Notes: Due eye exam , Patient to schedule mammogram

## 2021-11-11 ENCOUNTER — Telehealth: Payer: Self-pay

## 2021-11-11 NOTE — Telephone Encounter (Signed)
Informed pt that her Rx Ozempic is here and placed in fridge till pt can come pick up

## 2021-11-11 NOTE — Telephone Encounter (Signed)
Ozempic dispensed to pt's husband.

## 2021-11-27 ENCOUNTER — Ambulatory Visit: Payer: Medicare HMO

## 2021-12-18 ENCOUNTER — Other Ambulatory Visit: Payer: Self-pay | Admitting: Family Medicine

## 2021-12-31 ENCOUNTER — Ambulatory Visit
Admission: RE | Admit: 2021-12-31 | Discharge: 2021-12-31 | Disposition: A | Discharge status: Home / Self Care | Payer: Medicare HMO | Source: Ambulatory Visit | Attending: Family Medicine | Admitting: Family Medicine

## 2021-12-31 DIAGNOSIS — Z1231 Encounter for screening mammogram for malignant neoplasm of breast: Secondary | ICD-10-CM

## 2022-01-02 ENCOUNTER — Other Ambulatory Visit: Payer: Self-pay | Admitting: Family Medicine

## 2022-01-02 DIAGNOSIS — R928 Other abnormal and inconclusive findings on diagnostic imaging of breast: Secondary | ICD-10-CM

## 2022-01-05 ENCOUNTER — Other Ambulatory Visit: Payer: Self-pay | Admitting: Lab

## 2022-01-05 ENCOUNTER — Other Ambulatory Visit: Payer: Self-pay | Admitting: Family Medicine

## 2022-01-05 DIAGNOSIS — I10 Essential (primary) hypertension: Secondary | ICD-10-CM

## 2022-01-05 MED ORDER — CARVEDILOL 12.5 MG PO TABS: 12.5000 mg | ORAL_TABLET | Freq: Two times a day (BID) | ORAL | 1 refills | Status: DC

## 2022-01-07 ENCOUNTER — Telehealth: Payer: Self-pay

## 2022-01-07 NOTE — Telephone Encounter (Signed)
I have laid this pt 's [employer] Patient Assistance program forms on your desk .

## 2022-01-12 ENCOUNTER — Ambulatory Visit
Admission: RE | Admit: 2022-01-12 | Discharge: 2022-01-12 | Disposition: A | Discharge status: Home / Self Care | Payer: Medicare HMO | Source: Ambulatory Visit | Attending: Family Medicine | Admitting: Family Medicine

## 2022-01-12 DIAGNOSIS — R928 Other abnormal and inconclusive findings on diagnostic imaging of breast: Secondary | ICD-10-CM

## 2022-01-12 DIAGNOSIS — N6001 Solitary cyst of right breast: Secondary | ICD-10-CM | POA: Diagnosis not present

## 2022-01-12 DIAGNOSIS — R922 Inconclusive mammogram: Secondary | ICD-10-CM | POA: Diagnosis not present

## 2022-02-10 ENCOUNTER — Ambulatory Visit (INDEPENDENT_AMBULATORY_CARE_PROVIDER_SITE_OTHER): Payer: Medicare HMO | Admitting: Family Medicine

## 2022-02-10 ENCOUNTER — Telehealth: Payer: Self-pay

## 2022-02-10 ENCOUNTER — Telehealth: Payer: Self-pay | Admitting: Family Medicine

## 2022-02-10 ENCOUNTER — Encounter: Payer: Self-pay | Admitting: Family Medicine

## 2022-02-10 VITALS — BP 126/72 | HR 72 | Temp 98.2°F | Resp 18 | Ht 60.0 in | Wt 147.1 lb

## 2022-02-10 DIAGNOSIS — Z0000 Encounter for general adult medical examination without abnormal findings: Secondary | ICD-10-CM

## 2022-02-10 DIAGNOSIS — N1832 Chronic kidney disease, stage 3b: Secondary | ICD-10-CM | POA: Diagnosis not present

## 2022-02-10 DIAGNOSIS — N184 Chronic kidney disease, stage 4 (severe): Secondary | ICD-10-CM

## 2022-02-10 DIAGNOSIS — E1122 Type 2 diabetes mellitus with diabetic chronic kidney disease: Secondary | ICD-10-CM | POA: Diagnosis not present

## 2022-02-10 DIAGNOSIS — E559 Vitamin D deficiency, unspecified: Secondary | ICD-10-CM | POA: Diagnosis not present

## 2022-02-10 LAB — LIPID PANEL
Cholesterol: 189 mg/dL (ref 0–200)
HDL: 26.6 mg/dL — ABNORMAL LOW (ref >39.00)
LDL Cholesterol: 133 mg/dL — ABNORMAL HIGH (ref 0–99)
NonHDL: 162.54
Total CHOL/HDL Ratio: 7
Triglycerides: 149 mg/dL (ref 0.0–149.0)
VLDL: 29.8 mg/dL (ref 0.0–40.0)

## 2022-02-10 LAB — CBC WITH DIFFERENTIAL/PLATELET
Basophils Absolute: 0.1 10*3/uL (ref 0.0–0.1)
Basophils Relative: 1.2 % (ref 0.0–3.0)
Eosinophils Absolute: 0.5 10*3/uL (ref 0.0–0.7)
Eosinophils Relative: 5.3 % — ABNORMAL HIGH (ref 0.0–5.0)
HCT: 32.8 % — ABNORMAL LOW (ref 36.0–46.0)
Hemoglobin: 10.6 g/dL — ABNORMAL LOW (ref 12.0–15.0)
Lymphocytes Relative: 18.1 % (ref 12.0–46.0)
Lymphs Abs: 1.6 10*3/uL (ref 0.7–4.0)
MCHC: 32.4 g/dL (ref 30.0–36.0)
MCV: 86.5 fl (ref 78.0–100.0)
Monocytes Absolute: 0.4 10*3/uL (ref 0.1–1.0)
Monocytes Relative: 4.6 % (ref 3.0–12.0)
Neutro Abs: 6.3 10*3/uL (ref 1.4–7.7)
Neutrophils Relative %: 70.8 % (ref 43.0–77.0)
Platelets: 326 10*3/uL (ref 150.0–400.0)
RBC: 3.79 Mil/uL — ABNORMAL LOW (ref 3.87–5.11)
RDW: 15.6 % — ABNORMAL HIGH (ref 11.5–15.5)
WBC: 9 10*3/uL (ref 4.0–10.5)

## 2022-02-10 LAB — VITAMIN D 25 HYDROXY (VIT D DEFICIENCY, FRACTURES): VITD: 17.68 ng/mL — ABNORMAL LOW (ref 30.00–100.00)

## 2022-02-10 LAB — BASIC METABOLIC PANEL
BUN: 58 mg/dL — ABNORMAL HIGH (ref 6–23)
CO2: 17 mEq/L — ABNORMAL LOW (ref 19–32)
Calcium: 7.4 mg/dL — ABNORMAL LOW (ref 8.4–10.5)
Chloride: 111 mEq/L (ref 96–112)
Creatinine, Ser: 4.11 mg/dL — ABNORMAL HIGH (ref 0.40–1.20)
GFR: 11.45 mL/min — CL (ref >60.00)
Glucose, Bld: 92 mg/dL (ref 70–99)
Potassium: 5.1 mEq/L (ref 3.5–5.1)
Sodium: 140 mEq/L (ref 135–145)

## 2022-02-10 LAB — HEPATIC FUNCTION PANEL
ALT: 5 U/L (ref 0–35)
AST: 10 U/L (ref 0–37)
Albumin: 3.7 g/dL (ref 3.5–5.2)
Alkaline Phosphatase: 92 U/L (ref 39–117)
Bilirubin, Direct: 0.1 mg/dL (ref 0.0–0.3)
Total Bilirubin: 0.3 mg/dL (ref 0.2–1.2)
Total Protein: 6.6 g/dL (ref 6.0–8.3)

## 2022-02-10 LAB — TSH: TSH: 0.23 u[IU]/mL — ABNORMAL LOW (ref 0.35–5.50)

## 2022-02-10 LAB — HEMOGLOBIN A1C: Hgb A1c MFr Bld: 5.6 % (ref 4.6–6.5)

## 2022-02-10 NOTE — Telephone Encounter (Signed)
Thanks I will look tomorrow!

## 2022-02-10 NOTE — Progress Notes (Signed)
Subjective:    Patient ID: Marissa Scott, female    DOB: Feb 29, 1965, 57 y.o.   MRN: [id number]  HPI CPE- UTD on foot exam, microalbumin.  Due for eye exam- pt to schedule.  UTD on mammo, pap, colonoscopy, Tdap  Patient Care Team    Relationship Specialty Notifications Start End  Midge Minium, MD PCP - General   01/15/10   Bo Merino, MD Consulting Physician Rheumatology  09/01/13   Renato Shin, MD (Inactive) Consulting Physician Endocrinology  09/25/13   Jamal Maes, MD Consulting Physician Nephrology  01/15/14   Jari Pigg, MD Consulting Physician Dermatology  05/07/14   Sheryn Bison, MD Referring Physician Dermatology  11/22/15   Linda Hedges, [place] Physician Obstetrics and Gynecology  12/02/16   Edythe Clarity [facility]  Pharmacist  01/15/21    Comment: [phone]    Health Maintenance  Topic Date Due  . OPHTHALMOLOGY EXAM  12/03/2017  . HEMOGLOBIN A1C  01/31/2022  . Diabetic kidney evaluation - Urine ACR  04/17/2022  . FOOT EXAM  04/17/2022  . Diabetic kidney evaluation - eGFR measurement  08/02/2022  . Medicare Annual Wellness (AWV)  10/31/2022  . MAMMOGRAM  01/01/2023  . PAP SMEAR-Modifier  02/19/2024  . COLONOSCOPY (Pts 45-55yr Insurance coverage will need to be confirmed)  07/26/2024  . DTaP/Tdap/Td (2 - Td or Tdap) 03/06/2031  . Zoster Vaccines- Shingrix  Completed  . HPV VACCINES  Aged Out  . INFLUENZA VACCINE  Discontinued  . COVID-19 Vaccine  Discontinued  . Hepatitis C Screening  Discontinued  . HIV Screening  Discontinued     Review of Systems Patient reports no vision/ hearing changes, adenopathy,fever, weight change,  persistant/recurrent hoarseness , swallowing issues, chest pain, palpitations, edema, persistant/recurrent cough, hemoptysis, dyspnea (rest/exertional/paroxysmal nocturnal), gastrointestinal bleeding (melena, rectal bleeding), abdominal pain, significant heartburn, bowel changes, GU symptoms (dysuria,  hematuria, incontinence), Gyn symptoms (abnormal  bleeding, pain),  syncope, focal weakness, memory loss, numbness & tingling, skin/hair/nail changes, abnormal bruising or bleeding, anxiety, or depression.     Objective:   Physical Exam General Appearance:    Alert, cooperative, no distress, appears stated age  Head:    Normocephalic, without obvious abnormality, atraumatic  Eyes:    PERRL, conjunctiva/corneas clear, EOM's intact both eyes  Ears:    Normal TM's and external ear canals, both ears  Nose:   Nares normal, septum midline, mucosa normal, no drainage    or sinus tenderness  Throat:   Lips, mucosa, and tongue normal; teeth and gums normal  Neck:   Supple, symmetrical, trachea midline, no adenopathy;    Thyroid: no enlargement/tenderness/nodules  Back:     Symmetric, no curvature, ROM normal, no CVA tenderness  Lungs:     Clear to auscultation bilaterally, respirations unlabored  Chest Wall:    No tenderness or deformity   Heart:    Regular rate and rhythm, S1 and S2 normal, no murmur, rub   or gallop  Breast Exam:    Deferred to GYN  Abdomen:     Soft, non-tender, bowel sounds active all four quadrants,    no masses, no organomegaly  Genitalia:    Deferred to GYN  Rectal:    Extremities:   Extremities normal, atraumatic, no cyanosis or edema  Pulses:   2+ and symmetric all extremities  Skin:   Skin color, texture, turgor normal, no rashes or lesions  Lymph nodes:   Cervical, supraclavicular, and axillary nodes normal  Neurologic:   CNII-XII intact,  normal strength, sensation and reflexes    throughout          Assessment & Plan:

## 2022-02-10 NOTE — Patient Instructions (Signed)
Follow up in 6 months to recheck sugar, blood pressure and cholesterol We'll notify you of your lab results and make any changes if needed Keep up the good work on healthy diet and regular exercise- you look great!! Call and schedule your eye exam and have them send me a copy of their report Call with any questions or concerns Stay Safe!  Stay Healthy! Happy New Year!!!

## 2022-02-10 NOTE — Telephone Encounter (Signed)
[employer] envelope placed on your desk for fill out . Pt dropped off this morning

## 2022-02-10 NOTE — Telephone Encounter (Signed)
[facility] lab called with a critical.  GFR 11.45

## 2022-02-11 ENCOUNTER — Telehealth: Payer: Self-pay

## 2022-02-11 ENCOUNTER — Other Ambulatory Visit: Payer: Self-pay

## 2022-02-11 DIAGNOSIS — E559 Vitamin D deficiency, unspecified: Secondary | ICD-10-CM

## 2022-02-11 DIAGNOSIS — E039 Hypothyroidism, unspecified: Secondary | ICD-10-CM

## 2022-02-11 MED ORDER — LEVOTHYROXINE SODIUM 88 MCG PO TABS: 88.0000 ug | ORAL_TABLET | Freq: Every day | ORAL | 3 refills | Status: DC

## 2022-02-11 MED ORDER — VITAMIN D (ERGOCALCIFEROL) 1.25 MG (50000 UNIT) PO CAPS: 50000.0000 [IU] | ORAL_CAPSULE | ORAL | 12 refills | Status: DC

## 2022-02-11 NOTE — Assessment & Plan Note (Signed)
Pt's PE WNL.  UTD on mammo, pap, colonoscopy, Tdap.  Check labs.  Anticipatory guidance provided.

## 2022-02-11 NOTE — Assessment & Plan Note (Signed)
Chronic problem.  Currently on Wegovy w/ hx of good control.  UTD on foot exam, microalbumin.  Due for eye exam and she plans to schedule.  Check labs.  Adjust meds prn

## 2022-02-11 NOTE — Assessment & Plan Note (Signed)
Check labs and replete prn. 

## 2022-02-11 NOTE — Telephone Encounter (Signed)
Thank you . I will let you know when I place them back .

## 2022-02-11 NOTE — Telephone Encounter (Signed)
Notified pt of lab results and faxed lab results to her kidney specialist [facility]

## 2022-02-11 NOTE — Telephone Encounter (Signed)
Left pt a VM to call office in regards to lab results . Vit d and Levothyroxine 88 mcg has been sent to pharmacy . Pt needs a lab only visit for one month to repeat TSH and order is in

## 2022-02-11 NOTE — Telephone Encounter (Signed)
-----   Message from Midge Minium, MD sent at 02/11/2022  7:25 AM EST ----- Creatinine is up to 4.11 (from 3.92 in June).  Your kidney function is dangerously low and you need to see your kidney specialist ASAP (we will send a copy of these results to Dr Hollie Salk at [facility])  Because of your abnormal kidney function, you also have low calcium and Vit D which both need to be addressed by your kidney specialist.  In the meantime, we need to start 50,000 units weekly x12 weeks in addition to daily OTC supplement of at least 2000 units.   Your Hemoglobin (blood count) is stable  Your total cholesterol and LDL (bad cholesterol) are too high.  Particularly for someone with diabetes.  Please make sure you are taking your Crestor daily  Your TSH is too low which means your thyroid dose is too high.  We will decrease your Levothyroxine to 61mg daily (#30, 3 refills) and repeat your TSH level at a lab only visit in 1 month (dx hypothyroid)  A1C is amazing at 5.6%

## 2022-02-11 NOTE — Telephone Encounter (Signed)
Noted and addressed via result notes

## 2022-02-11 NOTE — Assessment & Plan Note (Signed)
Ongoing issue.  Pt was following w/ [facility] and has been undergoing a transplant workup.

## 2022-02-11 NOTE — Telephone Encounter (Signed)
I have left a VM for pt to call the office

## 2022-03-12 ENCOUNTER — Other Ambulatory Visit (INDEPENDENT_AMBULATORY_CARE_PROVIDER_SITE_OTHER): Payer: Medicare HMO

## 2022-03-12 ENCOUNTER — Other Ambulatory Visit: Payer: Self-pay

## 2022-03-12 ENCOUNTER — Telehealth: Payer: Self-pay

## 2022-03-12 DIAGNOSIS — E039 Hypothyroidism, unspecified: Secondary | ICD-10-CM

## 2022-03-12 LAB — TSH: TSH: 0.29 u[IU]/mL — ABNORMAL LOW (ref 0.35–5.50)

## 2022-03-12 MED ORDER — LEVOTHYROXINE SODIUM 75 MCG PO TABS: 75.0000 ug | ORAL_TABLET | Freq: Every day | ORAL | 3 refills | Status: DC

## 2022-03-12 NOTE — Telephone Encounter (Signed)
Informed pt of lab result. Sent in Levothyroxine 75 mcg to pharmacy and TSH order is in and lab only visit has been made

## 2022-03-12 NOTE — Telephone Encounter (Signed)
-----   Message from Midge Minium, MD sent at 03/12/2022  3:38 PM EST ----- TSH remains low.  Based on this we will decrease your Levothyroxine to 26mg (#30, 3 refills) daily and repeat your TSH at a lab only visit in 1 month.

## 2022-03-31 ENCOUNTER — Encounter: Payer: Self-pay | Admitting: Family Medicine

## 2022-04-02 ENCOUNTER — Encounter: Payer: Self-pay | Admitting: Family Medicine

## 2022-04-03 ENCOUNTER — Encounter: Payer: Self-pay | Admitting: Family Medicine

## 2022-04-09 ENCOUNTER — Other Ambulatory Visit (INDEPENDENT_AMBULATORY_CARE_PROVIDER_SITE_OTHER): Payer: Medicare HMO

## 2022-04-09 DIAGNOSIS — E039 Hypothyroidism, unspecified: Secondary | ICD-10-CM

## 2022-04-09 LAB — TSH: TSH: 10.3 u[IU]/mL — ABNORMAL HIGH (ref 0.35–5.50)

## 2022-04-10 ENCOUNTER — Other Ambulatory Visit: Payer: Self-pay

## 2022-04-10 ENCOUNTER — Telehealth: Payer: Self-pay

## 2022-04-10 DIAGNOSIS — E039 Hypothyroidism, unspecified: Secondary | ICD-10-CM

## 2022-04-10 NOTE — Telephone Encounter (Signed)
-----   Message from Midge Minium, MD sent at 04/10/2022  7:37 AM EST ----- My goodness!  Your thyroid took a BIG jump!  Since your TSH is now elevated, we will increase your Levothyroxine back to 129mcg daily (#30, 3 refills) and repeat your TSH level at a lab only visit in 1 month (dx hypothyroid)

## 2022-04-10 NOTE — Telephone Encounter (Signed)
Informed pt of lab results . Repeat TSH order is in and lab only visit has been made . Pt states she does not need an Rx for Levothyroxine 100 mcg states she has plenty of that mg at home . Refused to have a new Rx sent in

## 2022-04-12 ENCOUNTER — Encounter: Payer: Self-pay | Admitting: Family Medicine

## 2022-04-13 ENCOUNTER — Telehealth: Payer: Self-pay

## 2022-04-13 NOTE — Telephone Encounter (Signed)
Pt called and ask if we have heard anything back from the assistance program for her Ozempic . I know we signed the forms have you heard anything

## 2022-04-14 NOTE — Telephone Encounter (Signed)
Patient notified program had not yet received application, will re-fax today.  Follow up in 2 days

## 2022-04-14 NOTE — Telephone Encounter (Signed)
Informed pt that Beverly Milch will re-fax  forms

## 2022-04-22 ENCOUNTER — Other Ambulatory Visit: Payer: Self-pay | Admitting: Family Medicine

## 2022-04-22 DIAGNOSIS — E039 Hypothyroidism, unspecified: Secondary | ICD-10-CM

## 2022-04-23 ENCOUNTER — Other Ambulatory Visit: Payer: Self-pay

## 2022-04-30 ENCOUNTER — Telehealth: Payer: Self-pay | Admitting: Family Medicine

## 2022-04-30 NOTE — Telephone Encounter (Signed)
Noted  

## 2022-04-30 NOTE — Telephone Encounter (Signed)
FYI: Patient has picked up her medication (ozempic Semaglutide, 1 MG/DOSE, 4 MG/3ML SOPN)   (4 injections) from our office today.

## 2022-05-11 ENCOUNTER — Encounter: Payer: Self-pay | Admitting: Family Medicine

## 2022-05-11 ENCOUNTER — Other Ambulatory Visit (INDEPENDENT_AMBULATORY_CARE_PROVIDER_SITE_OTHER): Payer: Medicare HMO

## 2022-05-11 DIAGNOSIS — E039 Hypothyroidism, unspecified: Secondary | ICD-10-CM | POA: Diagnosis not present

## 2022-05-11 LAB — TSH: TSH: 0.83 u[IU]/mL (ref 0.35–5.50)

## 2022-05-12 ENCOUNTER — Telehealth: Payer: Self-pay

## 2022-05-12 NOTE — Telephone Encounter (Signed)
-----   Message from Sheliah Hatch, MD sent at 05/11/2022  4:54 PM EDT ----- TSH is now normal.  Great news!  No med changes at this time

## 2022-05-12 NOTE — Telephone Encounter (Signed)
Pt seen results Via my chart  

## 2022-05-13 ENCOUNTER — Other Ambulatory Visit: Payer: Self-pay | Admitting: Family Medicine

## 2022-05-13 DIAGNOSIS — E785 Hyperlipidemia, unspecified: Secondary | ICD-10-CM | POA: Diagnosis not present

## 2022-05-13 DIAGNOSIS — E039 Hypothyroidism, unspecified: Secondary | ICD-10-CM

## 2022-05-13 DIAGNOSIS — I10 Essential (primary) hypertension: Secondary | ICD-10-CM | POA: Diagnosis not present

## 2022-05-13 DIAGNOSIS — I6523 Occlusion and stenosis of bilateral carotid arteries: Secondary | ICD-10-CM | POA: Diagnosis not present

## 2022-05-13 DIAGNOSIS — I129 Hypertensive chronic kidney disease with stage 1 through stage 4 chronic kidney disease, or unspecified chronic kidney disease: Secondary | ICD-10-CM | POA: Diagnosis not present

## 2022-05-13 DIAGNOSIS — N184 Chronic kidney disease, stage 4 (severe): Secondary | ICD-10-CM | POA: Diagnosis not present

## 2022-05-13 DIAGNOSIS — E1122 Type 2 diabetes mellitus with diabetic chronic kidney disease: Secondary | ICD-10-CM | POA: Diagnosis not present

## 2022-05-24 ENCOUNTER — Encounter: Payer: Self-pay | Admitting: Family Medicine

## 2022-07-20 DIAGNOSIS — Z6825 Body mass index (BMI) 25.0-25.9, adult: Secondary | ICD-10-CM | POA: Diagnosis not present

## 2022-07-20 DIAGNOSIS — Z01419 Encounter for gynecological examination (general) (routine) without abnormal findings: Secondary | ICD-10-CM | POA: Diagnosis not present

## 2022-07-21 ENCOUNTER — Encounter: Payer: Self-pay | Admitting: Family Medicine

## 2022-08-04 ENCOUNTER — Telehealth: Payer: Self-pay

## 2022-08-04 NOTE — Telephone Encounter (Signed)
Informed pt her Ozempic is here for pickup . Placed in fridge on my side

## 2022-08-11 ENCOUNTER — Ambulatory Visit: Payer: Medicare HMO | Admitting: Family Medicine

## 2022-08-13 ENCOUNTER — Ambulatory Visit (INDEPENDENT_AMBULATORY_CARE_PROVIDER_SITE_OTHER): Payer: Medicare HMO | Admitting: Family Medicine

## 2022-08-13 ENCOUNTER — Telehealth: Payer: Self-pay

## 2022-08-13 ENCOUNTER — Encounter: Payer: Self-pay | Admitting: Family Medicine

## 2022-08-13 VITALS — BP 130/84 | HR 75 | Temp 98.1°F | Resp 18 | Ht 60.0 in | Wt 137.4 lb

## 2022-08-13 DIAGNOSIS — E039 Hypothyroidism, unspecified: Secondary | ICD-10-CM | POA: Diagnosis not present

## 2022-08-13 DIAGNOSIS — I10 Essential (primary) hypertension: Secondary | ICD-10-CM | POA: Diagnosis not present

## 2022-08-13 DIAGNOSIS — Z7985 Long-term (current) use of injectable non-insulin antidiabetic drugs: Secondary | ICD-10-CM

## 2022-08-13 DIAGNOSIS — E785 Hyperlipidemia, unspecified: Secondary | ICD-10-CM | POA: Diagnosis not present

## 2022-08-13 DIAGNOSIS — E1169 Type 2 diabetes mellitus with other specified complication: Secondary | ICD-10-CM

## 2022-08-13 DIAGNOSIS — N1832 Chronic kidney disease, stage 3b: Secondary | ICD-10-CM | POA: Diagnosis not present

## 2022-08-13 DIAGNOSIS — E1122 Type 2 diabetes mellitus with diabetic chronic kidney disease: Secondary | ICD-10-CM

## 2022-08-13 LAB — CBC WITH DIFFERENTIAL/PLATELET
Basophils Absolute: 0.1 10*3/uL (ref 0.0–0.1)
Basophils Relative: 1.2 % (ref 0.0–3.0)
Eosinophils Absolute: 0.6 10*3/uL (ref 0.0–0.7)
Eosinophils Relative: 7.3 % — ABNORMAL HIGH (ref 0.0–5.0)
HCT: 31.3 % — ABNORMAL LOW (ref 36.0–46.0)
Hemoglobin: 10.1 g/dL — ABNORMAL LOW (ref 12.0–15.0)
Lymphocytes Relative: 15.9 % (ref 12.0–46.0)
Lymphs Abs: 1.3 10*3/uL (ref 0.7–4.0)
MCHC: 32.4 g/dL (ref 30.0–36.0)
MCV: 87.5 fl (ref 78.0–100.0)
Monocytes Absolute: 0.4 10*3/uL (ref 0.1–1.0)
Monocytes Relative: 4.9 % (ref 3.0–12.0)
Neutro Abs: 5.9 10*3/uL (ref 1.4–7.7)
Neutrophils Relative %: 70.7 % (ref 43.0–77.0)
Platelets: 291 10*3/uL (ref 150.0–400.0)
RBC: 3.58 Mil/uL — ABNORMAL LOW (ref 3.87–5.11)
RDW: 13.9 % (ref 11.5–15.5)
WBC: 8.3 10*3/uL (ref 4.0–10.5)

## 2022-08-13 LAB — BASIC METABOLIC PANEL
BUN: 68 mg/dL — ABNORMAL HIGH (ref 6–23)
CO2: 18 mEq/L — ABNORMAL LOW (ref 19–32)
Calcium: 8.9 mg/dL (ref 8.4–10.5)
Chloride: 111 mEq/L (ref 96–112)
Creatinine, Ser: 4.75 mg/dL (ref 0.40–1.20)
GFR: 9.59 mL/min — CL (ref >60.00)
Glucose, Bld: 94 mg/dL (ref 70–99)
Potassium: 5.7 mEq/L — ABNORMAL HIGH (ref 3.5–5.1)
Sodium: 137 mEq/L (ref 135–145)

## 2022-08-13 LAB — HEPATIC FUNCTION PANEL
ALT: 5 U/L (ref 0–35)
AST: 11 U/L (ref 0–37)
Albumin: 3.7 g/dL (ref 3.5–5.2)
Alkaline Phosphatase: 80 U/L (ref 39–117)
Bilirubin, Direct: 0.1 mg/dL (ref 0.0–0.3)
Total Bilirubin: 0.3 mg/dL (ref 0.2–1.2)
Total Protein: 6.6 g/dL (ref 6.0–8.3)

## 2022-08-13 LAB — TSH: TSH: 0.07 u[IU]/mL — ABNORMAL LOW (ref 0.35–5.50)

## 2022-08-13 LAB — LIPID PANEL
Cholesterol: 164 mg/dL (ref 0–200)
HDL: 26.3 mg/dL — ABNORMAL LOW (ref >39.00)
LDL Cholesterol: 121 mg/dL — ABNORMAL HIGH (ref 0–99)
NonHDL: 137.71
Total CHOL/HDL Ratio: 6
Triglycerides: 83 mg/dL (ref 0.0–149.0)
VLDL: 16.6 mg/dL (ref 0.0–40.0)

## 2022-08-13 LAB — MICROALBUMIN / CREATININE URINE RATIO
Creatinine,U: 83.5 mg/dL
Microalb Creat Ratio: 110.4 mg/g — ABNORMAL HIGH (ref 0.0–30.0)
Microalb, Ur: 92.2 mg/dL — ABNORMAL HIGH (ref 0.0–1.9)

## 2022-08-13 LAB — HEMOGLOBIN A1C: Hgb A1c MFr Bld: 5.6 % (ref 4.6–6.5)

## 2022-08-13 MED ORDER — TRIAMCINOLONE ACETONIDE 0.1 % EX CREA: 1.0000 | TOPICAL_CREAM | Freq: Two times a day (BID) | CUTANEOUS | 3 refills | Status: DC

## 2022-08-13 NOTE — Telephone Encounter (Signed)
Critical lab call for creatinine 4.75 and critical GFR 9.59

## 2022-08-13 NOTE — Patient Instructions (Signed)
Schedule your complete physical in 6 months We'll notify you of your lab results and make any changes if needed Keep up the good work on healthy diet and regular exercise- you look great!! Call with any questions or concerns Stay Safe!  Stay Healthy! Have a great summer!!! 

## 2022-08-13 NOTE — Progress Notes (Signed)
   Subjective:    Patient ID: Marissa Scott, female    DOB: Jan 22, 1966, 57 y.o.   MRN: [id number]  HPI DM- chronic problem, on Ozempic 1mg  weekly.  Has lost another 10 lbs since January.  Due for foot exam, eye exam, microalbumin.  Eye exam scheduled for next week.  Pt reports feeling well.  Denies symptomatic lows.  No numbness/tingling of hands/feet.  HTN- chronic problem, on Coreg 12.5mg  BID.  No CP, SOB, HA's, visual changes, edema.  Hyperlipidemia- chronic problem, on Crestor 10mg  nightly.  No abd pain, N/V.  Hypothyroid- chronic problem.  On Levothyroxine daily.  No changes to skin/hair/nails.   Review of Systems For ROS see HPI     Objective:   Physical Exam Vitals reviewed.  Constitutional:      General: She is not in acute distress.    Appearance: Normal appearance. She is well-developed. She is not ill-appearing.  HENT:     Head: Normocephalic and atraumatic.  Eyes:     Conjunctiva/sclera: Conjunctivae normal.     Pupils: Pupils are equal, round, and reactive to light.  Neck:     Thyroid: No thyromegaly.  Cardiovascular:     Rate and Rhythm: Normal rate and regular rhythm.     Pulses: Normal pulses.     Heart sounds: Normal heart sounds. No murmur heard. Pulmonary:     Effort: Pulmonary effort is normal. No respiratory distress.     Breath sounds: Normal breath sounds.  Abdominal:     General: There is no distension.     Palpations: Abdomen is soft.     Tenderness: There is no abdominal tenderness.  Musculoskeletal:     Cervical back: Normal range of motion and neck supple.     Right lower leg: No edema.     Left lower leg: No edema.  Lymphadenopathy:     Cervical: No cervical adenopathy.  Skin:    General: Skin is warm and dry.  Neurological:     General: No focal deficit present.     Mental Status: She is alert and oriented to person, place, and time.  Psychiatric:        Mood and Affect: Mood normal.        Behavior: Behavior normal.         Thought Content: Thought content normal.          Assessment & Plan:

## 2022-08-13 NOTE — Assessment & Plan Note (Signed)
Ongoing issue for pt.  Currently on Ozempic 1mg  weekly.  Is down another 10 lbs.  Foot exam done today.  Eye exam scheduled for next week.  Microalbumin ordered.  Check labs.  Adjust meds prn

## 2022-08-13 NOTE — Assessment & Plan Note (Signed)
Chronic problem.  Adequate control on Coreg 12.5mg  BID.  Currently asymptomatic.  No changes at this time.

## 2022-08-13 NOTE — Assessment & Plan Note (Signed)
Chronic problem.  Currently on Crestor 10mg  nightly w/o difficulty.  Check labs.  Adjust meds prn

## 2022-08-13 NOTE — Assessment & Plan Note (Signed)
Chronic problem.  Currently asymptomatic on Levothyroxine 100mcg daily.  Check labs.  Adjust meds prn  ?

## 2022-08-14 ENCOUNTER — Telehealth: Payer: Self-pay

## 2022-08-14 ENCOUNTER — Other Ambulatory Visit: Payer: Self-pay

## 2022-08-14 DIAGNOSIS — E039 Hypothyroidism, unspecified: Secondary | ICD-10-CM

## 2022-08-14 DIAGNOSIS — E875 Hyperkalemia: Secondary | ICD-10-CM

## 2022-08-14 NOTE — Telephone Encounter (Signed)
I have advised pt of lab results and the repeat BMP and TSH order is in . Lab apt's has been made as well. She is contacting her kidney dr

## 2022-08-14 NOTE — Telephone Encounter (Signed)
Informed pt of lab results this morning

## 2022-08-14 NOTE — Telephone Encounter (Signed)
Addressed via Result Note 

## 2022-08-14 NOTE — Telephone Encounter (Signed)
-----   Message from Neena Rhymes sent at 08/14/2022  7:27 AM EDT ----- Your Creatinine is up to 4.75- which is the highest it has been.  I know you were being evaluated for a kidney transplant but I'm not sure where you are in that process or who you are currently seeing.  At this point, you need to see your kidney doctor ASAP.  If you need help getting an appointment, just let me know.  Your potassium is elevated.  This is due to your decreased kidney function.  We will repeat your BMP at a lab only visit in 1 week (dx elevated potassium)  Your TSH is low, which means your Levothyroxine dose is too high.  Based on this, we need to decrease your Levothyroxine to daily (#30, 3 refills) and repeat your TSH level at a lab only visit in 1 month  Your total cholesterol and LDL (bad cholesterol) are stable.  Please continue to take your Rosuvastatin (Crestor) daily

## 2022-08-17 ENCOUNTER — Encounter: Payer: Self-pay | Admitting: Family Medicine

## 2022-08-26 ENCOUNTER — Ambulatory Visit (INDEPENDENT_AMBULATORY_CARE_PROVIDER_SITE_OTHER): Payer: Medicare HMO | Admitting: *Deleted

## 2022-08-26 DIAGNOSIS — Z0000 Encounter for general adult medical examination without abnormal findings: Secondary | ICD-10-CM

## 2022-08-26 NOTE — Progress Notes (Signed)
Subjective:   Marissa Scott is a 57 y.o. female who presents for Medicare Annual (Subsequent) preventive examination.  Visit Complete: Virtual  I connected with  Marissa Scott on 08/26/22 by a audio enabled telemedicine application and verified that I am speaking with the correct person using two identifiers.  Patient Location: Home  Provider Location: Home Office  I discussed the limitations of evaluation and management by telemedicine. The patient expressed understanding and agreed to proceed.  Patient Medicare AWV questionnaire was completed by the patient on 08-13-2022; I have confirmed that all information answered by patient is correct and no changes since this date.  Patient not in clinic unable to obtain vitals  Review of Systems            Objective:    Today's Vitals   There is no height or weight on file to calculate BMI.     10/30/2021    2:32 PM 08/28/2021    9:37 AM 10/11/2014   10:24 PM 02/19/2014    2:29 PM  Advanced Directives  Does Patient Have a Medical Advance Directive? No No No No  Would patient like information on creating a medical advance directive? No - Patient declined No - Patient declined      Current Medications (verified) Outpatient Encounter Medications as of 08/26/2022  Medication Sig   acetaminophen (TYLENOL) 650 MG CR tablet Take 650 mg by mouth every 6 (six) hours.   carvedilol (COREG) 12.5 MG tablet Take 1 tablet (12.5 mg total) by mouth 2 (two) times daily with a meal.   levothyroxine (SYNTHROID) 100 MCG tablet TAKE 1 TABLET EVERY DAY   Multiple Vitamin (MULTI-VITAMINS) TABS Take 1 tablet by mouth daily.   rosuvastatin (CRESTOR) 10 MG tablet TAKE 1 TABLET AT BEDTIME   Semaglutide, 1 MG/DOSE, 4 MG/3ML SOPN Inject 1 mg as directed once a week.   triamcinolone cream (KENALOG) 0.1 % Apply 1 Application topically 2 (two) times daily.   Vitamin D, Ergocalciferol, (DRISDOL) 1.25 MG (50000 UNIT) CAPS capsule Take 1 capsule  (50,000 Units total) by mouth every 7 (seven) days.   No facility-administered encounter medications on file as of 08/26/2022.    Allergies (verified) Egg-derived products, Enalapril maleate, Penicillin g, and Penicillins   History: Past Medical History:  Diagnosis Date   Anemia    Arthritis    DIABETES MELLITUS, TYPE II 01/30/2008   HYPERLIPIDEMIA 01/30/2008   HYPERTENSION 01/30/2008   HYPERURICEMIA 10/01/2008   Hypothyroidism    Obesity    Polycystic ovaries 01/04/2009   RENAL DISEASE, CHRONIC, STAGE III 01/30/2008   Follows w/ Renal   Past Surgical History:  Procedure Laterality Date   APPENDECTOMY     COLONOSCOPY  03/05/2014   ESOPHAGOGASTRODUODENOSCOPY     LUMBAR FUSION     L4-5   LUMBAR LAMINECTOMY     L4-L5   NEPHRECTOMY     left removed, partial right-Ottelin   Neural Stimulator     POLYPECTOMY     Right hand cyst     TUBAL LIGATION     GYN Mezer   Family History  Problem Relation Age of Onset   Hypertension Father    Heart disease Father        CAD   Diabetes Father    Stroke Father    Colon cancer Father 19   Colon polyps Father    Colon cancer Cousin 52       Lynch Syndrome   Stomach cancer Cousin 21  Kidney cancer Cousin    Lung cancer Maternal Aunt 60       history of smoking   Esophageal cancer Neg Hx    Rectal cancer Neg Hx    Social History   Socioeconomic History   Marital status: Married    Spouse name: Not on file   Number of children: 1   Years of education: Not on file   Highest education level: Not on file  Occupational History   Occupation: Disabled  Tobacco Use   Smoking status: Never   Smokeless tobacco: Never  Vaping Use   Vaping status: Never Used  Substance and Sexual Activity   Alcohol use: Yes    Comment: rarely   Drug use: No   Sexual activity: Yes  Other Topics Concern   Not on file  Social History Narrative   Disabled for back pain   Social Determinants of Health   Financial Resource Strain: Low Risk   (10/30/2021)   Overall Financial Resource Strain (CARDIA)    Difficulty of Paying Living Expenses: Not hard at all  Food Insecurity: No Food Insecurity (10/30/2021)   Hunger Vital Sign    Worried About Running Out of Food in the Last Year: Never true    Ran Out of Food in the Last Year: Never true  Transportation Needs: No Transportation Needs (10/30/2021)   PRAPARE - Administrator, Civil Service (Medical): No    Lack of Transportation (Non-Medical): No  Physical Activity: Insufficiently Active (10/30/2021)   Exercise Vital Sign    Days of Exercise per Week: 3 days    Minutes of Exercise per Session: 30 min  Stress: No Stress Concern Present (10/30/2021)   [employer] of Occupational Health - Occupational Stress Questionnaire    Feeling of Stress : Not at all  Social Connections: Socially Integrated (10/30/2021)   Social Connection and Isolation Panel [NHANES]    Frequency of Communication with Friends and Family: More than three times a week    Frequency of Social Gatherings with Friends and Family: More than three times a week    Attends Religious Services: More than 4 times per year    Active Member of [employer] or Organizations: Yes    Attends Engineer, structural: More than 4 times per year    Marital Status: Married    Tobacco Counseling Counseling given: Not Answered   Clinical Intake:                        Activities of Daily Living    02/10/2022   11:16 AM 10/30/2021    2:33 PM  In your present state of health, do you have any difficulty performing the following activities:  Hearing? 0 0  Vision? 0 0  Difficulty concentrating or making decisions? 0 0  Walking or climbing stairs? 0 0  Dressing or bathing? 0 0  Doing errands, shopping? 0 0  Preparing Food and eating ?  N  Using the Toilet?  N  In the past six months, have you accidently leaked urine?  N  Do you have problems with loss of bowel control?  N  Managing your  Medications?  N  Managing your Finances?  N  Housekeeping or managing your Housekeeping?  N    Patient Care Team: Sheliah Hatch, MD as PCP - General (Family Medicine) Pollyann Savoy, MD as Consulting Physician (Rheumatology) Romero Belling, MD (Inactive) as Consulting Physician (Endocrinology) Camille Bal, MD  as Consulting Physician (Nephrology) Elmon Else, MD as Consulting Physician (Dermatology) Malva Cogan, MD as Referring Physician (Dermatology) Mitchel Honour, DO as Consulting Physician (Obstetrics and Gynecology) Erroll Luna, [facility] (Inactive) (Pharmacist) [place], [facility]  Indicate any recent Medical Services you may have received from other than [facility] providers in the past year (date may be approximate).     Assessment:   This is a routine wellness examination for Lupe.  Hearing/Vision screen No results found.  Dietary issues and exercise activities discussed:     Goals Addressed   None    Depression Screen    08/13/2022   10:49 AM 02/10/2022   11:16 AM 10/30/2021    2:31 PM 08/01/2021   10:33 AM 05/28/2021    1:26 PM 04/16/2021    2:31 PM 03/05/2021    1:56 PM  PHQ 2/9 Scores  PHQ - 2 Score 0 0 0 0 0 0 0  PHQ- 9 Score 3 3  0 1 1 0    Fall Risk    08/13/2022   10:52 AM 02/10/2022   11:16 AM 10/30/2021    2:30 PM 08/01/2021   10:33 AM 05/28/2021    1:21 PM  Fall Risk   Falls in the past year? 1 0 0  1  Number falls in past yr: 0 0 0 0 1  Injury with Fall? 0 0 0 0 1  Risk for fall due to : No Fall Risks No Fall Risks No Fall Risks No Fall Risks History of fall(s)  Follow up Falls evaluation completed Falls evaluation completed Falls prevention discussed Falls evaluation completed Falls evaluation completed    MEDICARE RISK AT HOME:   TIMED UP AND GO:  Was the test performed?  No    Cognitive Function:        10/30/2021    2:33 PM  6CIT Screen  What Year? 0 points  What month? 0 points  What time? 0 points  Count back from  20 0 points  Months in reverse 0 points  Repeat phrase 0 points  Total Score 0 points    Immunizations Immunization History  Administered Date(s) Administered   DTaP 03/05/2021   Hep A / Hep B 03/24/2021, 05/22/2021   Hepatitis A 10/30/2021   Hepatitis B 10/30/2021   Moderna Sars-Covid-2 Vaccination 05/27/2019, 06/25/2019   PNEUMOCOCCAL CONJUGATE-20 03/05/2021   Tdap 03/05/2021   Unspecified SARS-COV-2 Vaccination 05/27/2019, 06/25/2019   Zoster Recombinant(Shingrix) 04/30/2021, 07/01/2021    TDAP status: Up to date  Flu Vaccine status: Due, Education has been provided regarding the importance of this vaccine. Advised may receive this vaccine at local pharmacy or Health Dept. Aware to provide a copy of the vaccination record if obtained from local pharmacy or Health Dept. Verbalized acceptance and understanding.  Pneumococcal vaccine status: Up to date  Covid-19 vaccine status: Information provided on how to obtain vaccines.   Qualifies for Shingles Vaccine? No   Zostavax completed No   Shingrix Completed?: Yes  Screening Tests Health Maintenance  Topic Date Due   OPHTHALMOLOGY EXAM  12/03/2017   Medicare Annual Wellness (AWV)  10/31/2022   MAMMOGRAM  01/01/2023   HEMOGLOBIN A1C  02/13/2023   Diabetic kidney evaluation - eGFR measurement  08/13/2023   Diabetic kidney evaluation - Urine ACR  08/13/2023   FOOT EXAM  08/13/2023   PAP SMEAR-Modifier  02/25/2024   Colonoscopy  07/26/2024   DTaP/Tdap/Td (3 - Td or Tdap) 03/06/2031   Zoster Vaccines- Shingrix  Completed   HPV VACCINES  Aged Out   INFLUENZA VACCINE  Discontinued   COVID-19 Vaccine  Discontinued   Hepatitis C Screening  Discontinued   HIV Screening  Discontinued    Health Maintenance  Health Maintenance Due  Topic Date Due   OPHTHALMOLOGY EXAM  12/03/2017    Colorectal cancer screening: Type of screening: Colonoscopy. Completed 2021. Repeat every 5 years  Mammogram status: Completed  . Repeat  every year    Lung Cancer Screening: (Low Dose CT Chest recommended if Age 69-80 years, 20 pack-year currently smoking OR have quit w/in 15years.) does not qualify.   Lung Cancer Screening Referral:   Additional Screening:  Hepatitis C Screening: does not qualify;  Vision Screening: Recommended annual ophthalmology exams for early detection of glaucoma and other disorders of the eye. Is the patient up to date with their annual eye exam?  Yes  Who is the provider or what is the name of the office in which the patient attends annual eye exams? [facility] If pt is not established with a provider, would they like to be referred to a provider to establish care? No .   Dental Screening: Recommended annual dental exams for proper oral hygiene  Nutrition Risk Assessment:  Has the patient had any N/V/D within the last 2 months?  No  Does the patient have any non-healing wounds?  No  Has the patient had any unintentional weight loss or weight gain?  No   Diabetes:  Is the patient diabetic?  Yes  If diabetic, was a CBG obtained today?  No  Did the patient bring in their glucometer from home?  No  How often do you monitor your CBG's? rarley.   Financial Strains and Diabetes Management:  Are you having any financial strains with the device, your supplies or your medication? No .  Does the patient want to be seen by Chronic Care Management for management of their diabetes?  No  Would the patient like to be referred to a Nutritionist or for Diabetic Management?  No   Diabetic Exams:  Diabetic Eye Exam: Pt has been advised about the importance in completing this exam.Message sent to referral coordinator for scheduling purposes. Advised pt to expect a call from office referred to regarding appt.  Diabetic Foot Exam:  Pt has been advised about the importance in completing this exam.   Community Resource Referral / Chronic Care Management: CRR required this visit?  No   CCM required this  visit?  No     Plan:     I have personally reviewed and noted the following in the patient's chart:   Medical and social history Use of alcohol, tobacco or illicit drugs  Current medications and supplements including opioid prescriptions. Patient is not currently taking opioid prescriptions. Functional ability and status Nutritional status Physical activity Advanced directives List of other physicians Hospitalizations, surgeries, and ER visits in previous 12 months Vitals Screenings to include cognitive, depression, and falls Referrals and appointments  In addition, I have reviewed and discussed with patient certain preventive protocols, quality metrics, and best practice recommendations. A written personalized care plan for preventive services as well as general preventive health recommendations were provided to patient.     Remi Haggard, LPN   10/17/7827   After Visit Summary: (MyChart) Due to this being a telephonic visit, the after visit summary with patients personalized plan was offered to patient via MyChart   Nurse Notes:

## 2022-08-27 ENCOUNTER — Other Ambulatory Visit (INDEPENDENT_AMBULATORY_CARE_PROVIDER_SITE_OTHER): Payer: Medicare HMO

## 2022-08-27 ENCOUNTER — Telehealth: Payer: Self-pay | Admitting: Family Medicine

## 2022-08-27 DIAGNOSIS — E039 Hypothyroidism, unspecified: Secondary | ICD-10-CM | POA: Diagnosis not present

## 2022-08-27 DIAGNOSIS — E875 Hyperkalemia: Secondary | ICD-10-CM

## 2022-08-27 LAB — TSH: TSH: 0.1 u[IU]/mL — ABNORMAL LOW (ref 0.35–5.50)

## 2022-08-27 LAB — BASIC METABOLIC PANEL
BUN: 71 mg/dL — ABNORMAL HIGH (ref 6–23)
CO2: 18 mEq/L — ABNORMAL LOW (ref 19–32)
Calcium: 9.4 mg/dL (ref 8.4–10.5)
Chloride: 114 mEq/L — ABNORMAL HIGH (ref 96–112)
Creatinine, Ser: 4.89 mg/dL (ref 0.40–1.20)
GFR: 9.26 mL/min — CL (ref >60.00)
Glucose, Bld: 96 mg/dL (ref 70–99)
Potassium: 5.6 mEq/L — ABNORMAL HIGH (ref 3.5–5.1)
Sodium: 141 mEq/L (ref 135–145)

## 2022-08-27 NOTE — Telephone Encounter (Signed)
Aware of pt's renal function.  She is undergoing evaluation for kidney transplant

## 2022-08-27 NOTE — Telephone Encounter (Signed)
Hope from [place] lab critical result. Creatnine 4.89, GFR 9.26. Read results back to [facility] for confirmation. Sent PCP teams and phone note.

## 2022-08-28 ENCOUNTER — Other Ambulatory Visit: Payer: Self-pay

## 2022-08-28 DIAGNOSIS — E039 Hypothyroidism, unspecified: Secondary | ICD-10-CM

## 2022-08-28 MED ORDER — LEVOTHYROXINE SODIUM 88 MCG PO TABS: 88.0000 ug | ORAL_TABLET | Freq: Every day | ORAL | Status: DC

## 2022-09-10 DIAGNOSIS — D649 Anemia, unspecified: Secondary | ICD-10-CM | POA: Diagnosis not present

## 2022-09-10 DIAGNOSIS — N1370 Vesicoureteral-reflux, unspecified: Secondary | ICD-10-CM | POA: Diagnosis not present

## 2022-09-10 DIAGNOSIS — N2581 Secondary hyperparathyroidism of renal origin: Secondary | ICD-10-CM | POA: Diagnosis not present

## 2022-09-10 DIAGNOSIS — E872 Acidosis, unspecified: Secondary | ICD-10-CM | POA: Diagnosis not present

## 2022-09-10 DIAGNOSIS — N184 Chronic kidney disease, stage 4 (severe): Secondary | ICD-10-CM | POA: Diagnosis not present

## 2022-09-10 DIAGNOSIS — I129 Hypertensive chronic kidney disease with stage 1 through stage 4 chronic kidney disease, or unspecified chronic kidney disease: Secondary | ICD-10-CM | POA: Diagnosis not present

## 2022-09-10 DIAGNOSIS — E1122 Type 2 diabetes mellitus with diabetic chronic kidney disease: Secondary | ICD-10-CM | POA: Diagnosis not present

## 2022-09-14 ENCOUNTER — Other Ambulatory Visit: Payer: Medicare HMO

## 2022-09-15 ENCOUNTER — Other Ambulatory Visit: Payer: Self-pay

## 2022-09-15 ENCOUNTER — Other Ambulatory Visit (INDEPENDENT_AMBULATORY_CARE_PROVIDER_SITE_OTHER): Payer: Medicare HMO

## 2022-09-15 DIAGNOSIS — E039 Hypothyroidism, unspecified: Secondary | ICD-10-CM | POA: Diagnosis not present

## 2022-09-15 LAB — TSH: TSH: 0.38 u[IU]/mL (ref 0.35–5.50)

## 2022-09-16 ENCOUNTER — Telehealth: Payer: Self-pay

## 2022-09-16 NOTE — Telephone Encounter (Signed)
Left lab results on pt VM 

## 2022-09-16 NOTE — Telephone Encounter (Signed)
-----   Message from Neena Rhymes sent at 09/16/2022  7:40 AM EDT ----- TSH is now normal- great news!  No med changes at this time

## 2022-09-22 ENCOUNTER — Other Ambulatory Visit: Payer: Self-pay | Admitting: *Deleted

## 2022-09-22 DIAGNOSIS — N189 Chronic kidney disease, unspecified: Secondary | ICD-10-CM

## 2022-09-25 ENCOUNTER — Ambulatory Visit (INDEPENDENT_AMBULATORY_CARE_PROVIDER_SITE_OTHER): Admission: RE | Admit: 2022-09-25 | Payer: Medicare HMO | Source: Ambulatory Visit

## 2022-09-25 ENCOUNTER — Ambulatory Visit (HOSPITAL_COMMUNITY)
Admission: RE | Admit: 2022-09-25 | Discharge: 2022-09-25 | Disposition: A | Discharge status: Home / Self Care | Payer: Medicare HMO | Source: Ambulatory Visit | Attending: Vascular Surgery | Admitting: Vascular Surgery

## 2022-09-25 DIAGNOSIS — N184 Chronic kidney disease, stage 4 (severe): Secondary | ICD-10-CM | POA: Diagnosis not present

## 2022-09-25 DIAGNOSIS — N189 Chronic kidney disease, unspecified: Secondary | ICD-10-CM | POA: Insufficient documentation

## 2022-09-25 LAB — LAB REPORT - SCANNED
Albumin, Urine POC: 2112.5
Creatinine, POC: 91.1 mg/dL
EGFR: 9

## 2022-09-30 ENCOUNTER — Ambulatory Visit: Payer: Medicare HMO | Admitting: Vascular Surgery

## 2022-09-30 ENCOUNTER — Encounter: Payer: Self-pay | Admitting: Vascular Surgery

## 2022-09-30 VITALS — BP 207/101 | HR 71 | Temp 97.3°F | Resp 14 | Ht 60.0 in | Wt 133.0 lb

## 2022-09-30 DIAGNOSIS — N185 Chronic kidney disease, stage 5: Secondary | ICD-10-CM | POA: Diagnosis not present

## 2022-09-30 NOTE — Progress Notes (Signed)
Patient ID: Marissa Scott, female   DOB: 04-05-1965, 57 y.o.   MRN: [id number]  Reason for Consult: Chronic Kidney Disease (Permanent Access)   Referred by Bufford Buttner, MD  Subjective:     HPI:  Marissa Scott is a 57 y.o. female with chronic headaches for many years.  She has never.  She is right-hand dominant.  She has never had any left arm or chest or breast surgeries in the past.  Past Medical History:  Diagnosis Date   Anemia    Arthritis    DIABETES MELLITUS, TYPE II 01/30/2008   HYPERLIPIDEMIA 01/30/2008   HYPERTENSION 01/30/2008   HYPERURICEMIA 10/01/2008   Hypothyroidism    Obesity    Polycystic ovaries 01/04/2009   RENAL DISEASE, CHRONIC, STAGE III 01/30/2008   Follows w/ Renal   Family History  Problem Relation Age of Onset   Hypertension Father    Heart disease Father        CAD   Diabetes Father    Stroke Father    Colon cancer Father 45   Colon polyps Father    Colon cancer Cousin 47       Lynch Syndrome   Stomach cancer Cousin 44   Kidney cancer Cousin    Lung cancer Maternal Aunt 60       history of smoking   Esophageal cancer Neg Hx    Rectal cancer Neg Hx    Past Surgical History:  Procedure Laterality Date   APPENDECTOMY     COLONOSCOPY  03/05/2014   ESOPHAGOGASTRODUODENOSCOPY     LUMBAR FUSION     L4-5   LUMBAR LAMINECTOMY     L4-L5   NEPHRECTOMY     left removed, partial right-Ottelin   Neural Stimulator     POLYPECTOMY     Right hand cyst     TUBAL LIGATION     GYN Mezer    Short Social History:  Social History   Tobacco Use   Smoking status: Never   Smokeless tobacco: Never  Substance Use Topics   Alcohol use: Yes    Comment: rarely    Allergies  Allergen Reactions   Egg-Derived Products Itching   Enalapril Maleate    Penicillin G Nausea And Vomiting   Penicillins Rash    Current Outpatient Medications  Medication Sig Dispense Refill   acetaminophen (TYLENOL) 650 MG CR tablet Take 650 mg by  mouth every 6 (six) hours.     carvedilol (COREG) 12.5 MG tablet Take 1 tablet (12.5 mg total) by mouth 2 (two) times daily with a meal. 180 tablet 1   levothyroxine (SYNTHROID) 88 MCG tablet Take 1 tablet (88 mcg total) by mouth daily.     Multiple Vitamin (MULTI-VITAMINS) TABS Take 1 tablet by mouth daily.     rosuvastatin (CRESTOR) 10 MG tablet TAKE 1 TABLET AT BEDTIME 90 tablet 10   Semaglutide, 1 MG/DOSE, 4 MG/3ML SOPN Inject 1 mg as directed once a week. 3 mL 3   triamcinolone cream (KENALOG) 0.1 % Apply 1 Application topically 2 (two) times daily. (Patient not taking: Reported on 09/30/2022) 80 g 3   Vitamin D, Ergocalciferol, (DRISDOL) 1.25 MG (50000 UNIT) CAPS capsule Take 1 capsule (50,000 Units total) by mouth every 7 (seven) days. (Patient not taking: Reported on 09/30/2022) 7 capsule 12   No current facility-administered medications for this visit.    Review of Systems  Constitutional:  Constitutional negative. HENT: HENT negative.  Eyes: Eyes negative.  Respiratory:  Respiratory negative.  Cardiovascular: Cardiovascular negative.  GI: Gastrointestinal negative.  Musculoskeletal: Musculoskeletal negative.  Skin: Skin negative.  Neurological: Neurological negative. Hematologic: Hematologic/lymphatic negative.  Psychiatric: Psychiatric negative.        Objective:  Objective   Vitals:   09/30/22 1029  BP: (!) 207/101  Pulse: 71  Resp: 14  Temp: (!) 97.3 F (36.3 C)  TempSrc: Temporal  SpO2: 99%  Weight: 133 lb (60.3 kg)  Height: 5' (1.524 m)   Body mass index is 25.97 kg/m.  Physical Exam HENT:     Head: Normocephalic.     Nose: Nose normal.  Eyes:     Pupils: Pupils are equal, round, and reactive to light.  Cardiovascular:     Rate and Rhythm: Normal rate.     Pulses:          Radial pulses are 2+ on the right side and 2+ on the left side.  Pulmonary:     Effort: Pulmonary effort is normal.  Abdominal:     General: Abdomen is flat.  Musculoskeletal:      Right lower leg: No edema.     Left lower leg: No edema.  Skin:    General: Skin is warm.     Capillary Refill: Capillary refill takes less than 2 seconds.  Neurological:     General: No focal deficit present.     Mental Status: She is alert.  Psychiatric:        Mood and Affect: Mood normal.        Thought Content: Thought content normal.        Judgment: Judgment normal.     Data: Right Pre-Dialysis Findings:  +-----------------------+----------+--------------------+---------+--------  +  Location              PSV (cm/s)Intralum. Diam. (cm)Waveform  Comments  +-----------------------+----------+--------------------+---------+--------  +  Brachial Antecub. [id number]        0.43                triphasic           +-----------------------+----------+--------------------+---------+--------  +  Radial Art at Wrist    80        0.14                triphasic           +-----------------------+----------+--------------------+---------+--------  +  Ulnar Art at Wrist     70        0.12                triphasic           +-----------------------+----------+--------------------+---------+--------  +     Left Pre-Dialysis Findings:  +-----------------------+----------+--------------------+---------+--------  +  Location              PSV (cm/s)Intralum. Diam. (cm)Waveform  Comments  +-----------------------+----------+--------------------+---------+--------  +  Brachial Antecub. fossa96        0.37                triphasic           +-----------------------+----------+--------------------+---------+--------  +  Radial Art at Wrist    75        0.18                triphasic           +-----------------------+----------+--------------------+---------+--------  +  Ulnar Art at Wrist     48        0.13  triphasic           +-----------------------+----------+--------------------+---------+--------  +         Summary:    Right: No obstruction visualized in the right upper extremity.  Left: No obstruction visualized in the left upper extremity.  *See table(s) above for measurements and observations.    +-----------------+-------------+----------+---------+  Right Cephalic   Diameter (cm)Depth (cm)Findings   +-----------------+-------------+----------+---------+  Shoulder            0.23                          +-----------------+-------------+----------+---------+  Prox upper arm       0.22                          +-----------------+-------------+----------+---------+  Mid upper arm        0.19                          +-----------------+-------------+----------+---------+  Dist upper arm       0.20               branching  +-----------------+-------------+----------+---------+  Antecubital fossa    0.18                          +-----------------+-------------+----------+---------+  Prox forearm         0.42                          +-----------------+-------------+----------+---------+  Mid forearm          0.06                          +-----------------+-------------+----------+---------+   +-----------------+-------------+----------+--------+  Right Basilic    Diameter (cm)Depth (cm)Findings  +-----------------+-------------+----------+--------+  Mid upper arm        0.27                         +-----------------+-------------+----------+--------+  Dist upper arm       0.16                         +-----------------+-------------+----------+--------+  Antecubital fossa    0.17                         +-----------------+-------------+----------+--------+  Prox forearm         0.11                         +-----------------+-------------+----------+--------+   +-----------------+-------------+----------+--------+  Left Cephalic    Diameter (cm)Depth (cm)Findings   +-----------------+-------------+----------+--------+  Shoulder            0.17                         +-----------------+-------------+----------+--------+  Prox upper arm       0.17                         +-----------------+-------------+----------+--------+  Mid upper arm        0.13                         +-----------------+-------------+----------+--------+  Dist upper arm       0.12                         +-----------------+-------------+----------+--------+  Antecubital fossa    0.15                         +-----------------+-------------+----------+--------+  Prox forearm         0.20                         +-----------------+-------------+----------+--------+  Mid forearm          0.07                         +-----------------+-------------+----------+--------+   +-----------------+-------------+----------+--------+  Left Basilic     Diameter (cm)Depth (cm)Findings  +-----------------+-------------+----------+--------+  Mid upper arm        0.15                         +-----------------+-------------+----------+--------+  Dist upper arm       0.15                         +-----------------+-------------+----------+--------+  Antecubital fossa    0.13                         +-----------------+-------------+----------+--------+      Assessment/Plan:    57 year old female with advanced chronic kidney disease stage V.  We have been asked to place a fistula right arm graft.  She does not appear to have suitable vein under ultrasound or physical exam.  Will plan for AV graft left upper extremity when she is closer to requiring dialysis.  Specifics of the procedure were discussed with patient and her husband today and we can schedule without the need to be evaluated again if she has questions prior to surgery.     Maeola Harman MD [name]

## 2022-10-07 ENCOUNTER — Encounter: Payer: Self-pay | Admitting: Nephrology

## 2022-10-09 ENCOUNTER — Telehealth: Payer: Self-pay | Admitting: Family Medicine

## 2022-10-09 ENCOUNTER — Other Ambulatory Visit: Payer: Self-pay

## 2022-10-09 DIAGNOSIS — E039 Hypothyroidism, unspecified: Secondary | ICD-10-CM

## 2022-10-09 MED ORDER — LEVOTHYROXINE SODIUM 88 MCG PO TABS: 88.0000 ug | ORAL_TABLET | Freq: Every day | ORAL | 1 refills | Status: DC

## 2022-10-09 NOTE — Telephone Encounter (Signed)
Error

## 2022-10-15 ENCOUNTER — Other Ambulatory Visit: Payer: Self-pay

## 2022-10-15 DIAGNOSIS — E039 Hypothyroidism, unspecified: Secondary | ICD-10-CM

## 2022-10-15 MED ORDER — LEVOTHYROXINE SODIUM 88 MCG PO TABS: 88.0000 ug | ORAL_TABLET | Freq: Every day | ORAL | 1 refills | Status: DC

## 2022-10-21 ENCOUNTER — Encounter: Payer: Self-pay | Admitting: Family Medicine

## 2022-10-21 NOTE — Telephone Encounter (Signed)
The only difference is a different dye seems it is the Blue dye 1 manufacturer is the same

## 2022-10-21 NOTE — Telephone Encounter (Signed)
Pt notes a burning or brain freeze type feeling when taking levo rather than the , please advise

## 2022-10-22 MED ORDER — LEVOTHYROXINE SODIUM 100 MCG PO TABS: 100.0000 ug | ORAL_TABLET | Freq: Every day | ORAL | 3 refills | Status: DC

## 2022-11-19 DIAGNOSIS — E872 Acidosis, unspecified: Secondary | ICD-10-CM | POA: Diagnosis not present

## 2022-11-19 DIAGNOSIS — N184 Chronic kidney disease, stage 4 (severe): Secondary | ICD-10-CM | POA: Diagnosis not present

## 2022-11-19 DIAGNOSIS — N185 Chronic kidney disease, stage 5: Secondary | ICD-10-CM | POA: Diagnosis not present

## 2022-11-19 DIAGNOSIS — E1122 Type 2 diabetes mellitus with diabetic chronic kidney disease: Secondary | ICD-10-CM | POA: Diagnosis not present

## 2022-11-19 DIAGNOSIS — N2581 Secondary hyperparathyroidism of renal origin: Secondary | ICD-10-CM | POA: Diagnosis not present

## 2022-11-19 DIAGNOSIS — D649 Anemia, unspecified: Secondary | ICD-10-CM | POA: Diagnosis not present

## 2022-11-19 DIAGNOSIS — I120 Hypertensive chronic kidney disease with stage 5 chronic kidney disease or end stage renal disease: Secondary | ICD-10-CM | POA: Diagnosis not present

## 2022-11-23 ENCOUNTER — Encounter: Payer: Self-pay | Admitting: Family Medicine

## 2022-11-23 ENCOUNTER — Other Ambulatory Visit: Payer: Medicare HMO

## 2022-12-21 ENCOUNTER — Other Ambulatory Visit: Payer: Self-pay | Admitting: Family Medicine

## 2022-12-29 ENCOUNTER — Other Ambulatory Visit: Payer: Self-pay | Admitting: Family Medicine

## 2022-12-29 DIAGNOSIS — Z1231 Encounter for screening mammogram for malignant neoplasm of breast: Secondary | ICD-10-CM

## 2023-01-10 ENCOUNTER — Encounter: Payer: Self-pay | Admitting: Family Medicine

## 2023-01-11 MED ORDER — SEMAGLUTIDE (1 MG/DOSE) 4 MG/3ML ~~LOC~~ SOPN: 1.0000 mg | PEN_INJECTOR | SUBCUTANEOUS | 3 refills | Status: DC

## 2023-01-11 NOTE — Addendum Note (Signed)
Addended by: Sheliah Hatch on: 01/11/2023 11:33 AM   Modules accepted: Orders

## 2023-01-14 DIAGNOSIS — N185 Chronic kidney disease, stage 5: Secondary | ICD-10-CM | POA: Diagnosis not present

## 2023-01-14 DIAGNOSIS — E119 Type 2 diabetes mellitus without complications: Secondary | ICD-10-CM | POA: Diagnosis not present

## 2023-01-14 DIAGNOSIS — E872 Acidosis, unspecified: Secondary | ICD-10-CM | POA: Diagnosis not present

## 2023-01-14 DIAGNOSIS — E1122 Type 2 diabetes mellitus with diabetic chronic kidney disease: Secondary | ICD-10-CM | POA: Diagnosis not present

## 2023-01-14 DIAGNOSIS — D649 Anemia, unspecified: Secondary | ICD-10-CM | POA: Diagnosis not present

## 2023-01-14 DIAGNOSIS — I120 Hypertensive chronic kidney disease with stage 5 chronic kidney disease or end stage renal disease: Secondary | ICD-10-CM | POA: Diagnosis not present

## 2023-01-14 DIAGNOSIS — E039 Hypothyroidism, unspecified: Secondary | ICD-10-CM | POA: Diagnosis not present

## 2023-01-15 LAB — LAB REPORT - SCANNED
Albumin, Urine POC: 1790
Creatinine, POC: 77 mg/dL
EGFR: 8
Microalb Creat Ratio: 2325

## 2023-02-02 ENCOUNTER — Ambulatory Visit
Admission: RE | Admit: 2023-02-02 | Discharge: 2023-02-02 | Disposition: A | Discharge status: Home / Self Care | Payer: Medicare HMO | Source: Ambulatory Visit | Attending: Family Medicine | Admitting: Family Medicine

## 2023-02-02 DIAGNOSIS — Z1231 Encounter for screening mammogram for malignant neoplasm of breast: Secondary | ICD-10-CM | POA: Diagnosis not present

## 2023-02-05 ENCOUNTER — Encounter: Payer: Self-pay | Admitting: Family Medicine

## 2023-02-09 ENCOUNTER — Telehealth: Payer: Self-pay | Admitting: Family Medicine

## 2023-02-09 NOTE — Telephone Encounter (Signed)
 Type of form received:[employer]   Additional comments: Patient Surveyor, Quantity   Received ab:[id number] -Front Desk   Form should be Faxed/mailed to: Given back Pt   Is patient requesting call for pickup: Yes  Form placed: [employer] charge sheet.  Provider will determine charge. If applicable   Individual made aware of 3-5 business day turn around Yes?

## 2023-02-09 NOTE — Telephone Encounter (Signed)
 Placed in your sign folder

## 2023-02-11 NOTE — Telephone Encounter (Signed)
 Faxed  Called pt left vm to make her aware these forms are done. Placed copy in front file cabinet for pt to pick up.

## 2023-02-11 NOTE — Telephone Encounter (Signed)
 Forms completed and returned to [place]

## 2023-02-17 ENCOUNTER — Encounter: Payer: Self-pay | Admitting: Family Medicine

## 2023-02-17 ENCOUNTER — Ambulatory Visit: Payer: Medicare HMO | Admitting: Family Medicine

## 2023-02-17 VITALS — BP 130/72 | Temp 97.8°F | Ht 61.0 in | Wt 124.1 lb

## 2023-02-17 DIAGNOSIS — E559 Vitamin D deficiency, unspecified: Secondary | ICD-10-CM | POA: Diagnosis not present

## 2023-02-17 DIAGNOSIS — N185 Chronic kidney disease, stage 5: Secondary | ICD-10-CM

## 2023-02-17 DIAGNOSIS — Z1159 Encounter for screening for other viral diseases: Secondary | ICD-10-CM | POA: Diagnosis not present

## 2023-02-17 DIAGNOSIS — Z0000 Encounter for general adult medical examination without abnormal findings: Secondary | ICD-10-CM

## 2023-02-17 DIAGNOSIS — E1122 Type 2 diabetes mellitus with diabetic chronic kidney disease: Secondary | ICD-10-CM | POA: Diagnosis not present

## 2023-02-17 DIAGNOSIS — Z114 Encounter for screening for human immunodeficiency virus [HIV]: Secondary | ICD-10-CM

## 2023-02-17 MED ORDER — SEMAGLUTIDE (1 MG/DOSE) 4 MG/3ML ~~LOC~~ SOPN: 1.0000 mg | PEN_INJECTOR | SUBCUTANEOUS | 3 refills | Status: DC

## 2023-02-17 MED ORDER — TRIAMCINOLONE ACETONIDE 0.1 % EX CREA: 1.0000 | TOPICAL_CREAM | Freq: Two times a day (BID) | CUTANEOUS | 3 refills | Status: AC

## 2023-02-17 NOTE — Patient Instructions (Signed)
 Follow up in 6 months to recheck sugar, BP, and cholesterol We'll notify you of your lab results and make any changes if needed Keep up the good work!  You look great! Call with any questions or concerns Stay Safe!  Stay Healthy! Happy New Year!!

## 2023-02-17 NOTE — Progress Notes (Signed)
 Subjective:    Patient ID: Marissa Scott, female    DOB: 05-05-1965, 58 y.o.   MRN: [id number]  HPI CPE- UTD on pap, mammo, colonoscopy, Tdap, PNA.  UTD on eye exam, foot exam, microalbumin.  Patient Care Team    Relationship Specialty Notifications Start End  Jess Morita, MD PCP - General Family Medicine  05/11/22   Nicholas Bari, MD Consulting Physician Rheumatology  09/01/13   Gwyndolyn Lerner, MD (Inactive) Consulting Physician Endocrinology  09/25/13   Verlena Glenn, MD Consulting Physician Nephrology  01/15/14   Thais Fill, MD Consulting Physician Dermatology  05/07/14   Irwin Manual, MD Referring Physician Dermatology  11/22/15   Dyanna Glasgow, DO Consulting Physician Obstetrics and Gynecology  12/02/16   Myrle Aspen, [facility] (Inactive)  Pharmacist  01/15/21    Comment: [phone]  Eura Higashi, OD  Optometry  02/11/22   Lesle Ras, MD Referring Physician Nephrology  01/12/23     Health Maintenance  Topic Date Due   HIV Screening  Never done   Hepatitis C Screening  Never done   INFLUENZA VACCINE  Never done   COVID-19 Vaccine (5 - 2024-25 season) 10/04/2022   HEMOGLOBIN A1C  02/13/2023   FOOT EXAM  08/13/2023   Medicare Annual Wellness (AWV)  08/26/2023   OPHTHALMOLOGY EXAM  09/02/2023   Diabetic kidney evaluation - eGFR measurement  01/15/2024   Diabetic kidney evaluation - Urine ACR  01/15/2024   MAMMOGRAM  02/02/2024   Colonoscopy  07/26/2024   Cervical Cancer Screening (HPV/Pap Cotest)  02/24/2026   DTaP/Tdap/Td (3 - Td or Tdap) 03/06/2031   Pneumococcal Vaccine 68-2 Years old  Completed   Zoster Vaccines- Shingrix  Completed   HPV VACCINES  Aged Out      Review of Systems Patient reports no vision/ hearing changes, adenopathy,fever, weight change,  persistant/recurrent hoarseness , swallowing issues, chest pain, palpitations, edema, persistant/recurrent cough, hemoptysis, dyspnea (rest/exertional/paroxysmal  nocturnal), gastrointestinal bleeding (melena, rectal bleeding), abdominal pain, significant heartburn, bowel changes, GU symptoms (dysuria, hematuria, incontinence), Gyn symptoms (abnormal  bleeding, pain),  syncope, focal weakness, memory loss, numbness & tingling, skin/hair/nail changes, abnormal bruising or bleeding, anxiety, or depression.     Objective:   Physical Exam General Appearance:    Alert, cooperative, no distress, appears stated age  Head:    Normocephalic, without obvious abnormality, atraumatic  Eyes:    PERRL, conjunctiva/corneas clear, EOM's intact both eyes  Ears:    Normal TM's and external ear canals, both ears  Nose:   Nares normal, septum midline, mucosa normal, no drainage    or sinus tenderness  Throat:   Lips, mucosa, and tongue normal; teeth and gums normal  Neck:   Supple, symmetrical, trachea midline, no adenopathy;    Thyroid : no enlargement/tenderness/nodules  Back:     Symmetric, no curvature, ROM normal, no CVA tenderness  Lungs:     Clear to auscultation bilaterally, respirations unlabored  Chest Wall:    No tenderness or deformity   Heart:    Regular rate and rhythm, S1 and S2 normal, no murmur, rub   or gallop  Breast Exam:    Deferred to GYN  Abdomen:     Soft, non-tender, bowel sounds active all four quadrants,    no masses, no organomegaly  Genitalia:    Deferred to GYN  Rectal:    Extremities:   Extremities normal, atraumatic, no cyanosis or edema  Pulses:   2+ and symmetric all extremities  Skin:  Skin color, texture, turgor normal, no rashes or lesions  Lymph nodes:   Cervical, supraclavicular, and axillary nodes normal  Neurologic:   CNII-XII intact, normal strength, sensation and reflexes    throughout          Assessment & Plan:

## 2023-02-17 NOTE — Assessment & Plan Note (Signed)
 Pt's PE WNL.  She has lost another 9 lbs.  Encouraged her to eat regularly.  UTD on pap, mammo, colonoscopy, PNA, Tdap.  Declines flu.  Check labs. Anticipatory guidance provided.

## 2023-02-17 NOTE — Assessment & Plan Note (Signed)
 Ongoing issue.  Is on the transplant list.  Following closely w/ nephrology.  Will follow along.

## 2023-02-18 ENCOUNTER — Telehealth: Payer: Self-pay

## 2023-02-18 ENCOUNTER — Other Ambulatory Visit: Payer: Medicare HMO

## 2023-02-18 LAB — CBC WITH DIFFERENTIAL/PLATELET
Basophils Absolute: 0.1 10*3/uL (ref 0.0–0.1)
Basophils Relative: 0.8 % (ref 0.0–3.0)
Eosinophils Absolute: 0.6 10*3/uL (ref 0.0–0.7)
Eosinophils Relative: 7.4 % — ABNORMAL HIGH (ref 0.0–5.0)
HCT: 30.1 % — ABNORMAL LOW (ref 36.0–46.0)
Hemoglobin: 9.7 g/dL — ABNORMAL LOW (ref 12.0–15.0)
Lymphocytes Relative: 21.1 % (ref 12.0–46.0)
Lymphs Abs: 1.7 10*3/uL (ref 0.7–4.0)
MCHC: 32.1 g/dL (ref 30.0–36.0)
MCV: 85.6 fL (ref 78.0–100.0)
Monocytes Absolute: 0.3 10*3/uL (ref 0.1–1.0)
Monocytes Relative: 4.3 % (ref 3.0–12.0)
Neutro Abs: 5.4 10*3/uL (ref 1.4–7.7)
Neutrophils Relative %: 66.4 % (ref 43.0–77.0)
Platelets: 294 10*3/uL (ref 150.0–400.0)
RBC: 3.52 Mil/uL — ABNORMAL LOW (ref 3.87–5.11)
RDW: 14.4 % (ref 11.5–15.5)
WBC: 8.1 10*3/uL (ref 4.0–10.5)

## 2023-02-18 LAB — HEPATIC FUNCTION PANEL
ALT: 7 U/L (ref 0–35)
AST: 10 U/L (ref 0–37)
Albumin: 3.9 g/dL (ref 3.5–5.2)
Alkaline Phosphatase: 76 U/L (ref 39–117)
Bilirubin, Direct: 0.1 mg/dL (ref 0.0–0.3)
Total Bilirubin: 0.3 mg/dL (ref 0.2–1.2)
Total Protein: 6.8 g/dL (ref 6.0–8.3)

## 2023-02-18 LAB — LIPID PANEL
Cholesterol: 166 mg/dL (ref 0–200)
HDL: 26.3 mg/dL — ABNORMAL LOW (ref >39.00)
LDL Cholesterol: 119 mg/dL — ABNORMAL HIGH (ref 0–99)
NonHDL: 139.82
Total CHOL/HDL Ratio: 6
Triglycerides: 106 mg/dL (ref 0.0–149.0)
VLDL: 21.2 mg/dL (ref 0.0–40.0)

## 2023-02-18 LAB — VITAMIN D 25 HYDROXY (VIT D DEFICIENCY, FRACTURES): VITD: 17.58 ng/mL — ABNORMAL LOW (ref 30.00–100.00)

## 2023-02-18 LAB — BASIC METABOLIC PANEL
BUN: 81 mg/dL — ABNORMAL HIGH (ref 6–23)
CO2: 17 meq/L — ABNORMAL LOW (ref 19–32)
Calcium: 8.7 mg/dL (ref 8.4–10.5)
Chloride: 111 meq/L (ref 96–112)
Creatinine, Ser: 5.38 mg/dL (ref 0.40–1.20)
GFR: 8.23 mL/min — CL (ref >60.00)
Glucose, Bld: 93 mg/dL (ref 70–99)
Potassium: 5.6 meq/L — ABNORMAL HIGH (ref 3.5–5.1)
Sodium: 136 meq/L (ref 135–145)

## 2023-02-18 LAB — TSH: TSH: 9.83 u[IU]/mL — ABNORMAL HIGH (ref 0.35–5.50)

## 2023-02-18 LAB — HEMOGLOBIN A1C: Hgb A1c MFr Bld: 5.5 % (ref 4.6–6.5)

## 2023-02-18 LAB — HIV ANTIBODY (ROUTINE TESTING W REFLEX): HIV 1&2 Ab, 4th Generation: NONREACTIVE

## 2023-02-18 LAB — HEPATITIS C ANTIBODY: Hepatitis C Ab: NONREACTIVE

## 2023-02-18 NOTE — Telephone Encounter (Signed)
Shanequa from the lab called to let us know that this patient had a critical Creatinine of 5.38 and GFR of 8.23. Read results back to her. PCP has been made aware.

## 2023-02-22 ENCOUNTER — Encounter: Payer: Self-pay | Admitting: Family Medicine

## 2023-02-23 ENCOUNTER — Other Ambulatory Visit: Payer: Self-pay

## 2023-02-23 ENCOUNTER — Telehealth: Payer: Self-pay | Admitting: Family Medicine

## 2023-02-23 DIAGNOSIS — I10 Essential (primary) hypertension: Secondary | ICD-10-CM

## 2023-02-23 MED ORDER — CARVEDILOL 12.5 MG PO TABS
12.5000 mg | ORAL_TABLET | Freq: Two times a day (BID) | ORAL | 1 refills | Status: DC
Start: 2023-02-23 — End: 2023-09-09

## 2023-02-23 NOTE — Telephone Encounter (Signed)
Type of form received:[employer]  Additional comments: Not covered - Alternative   Received ZO:[id number]- Front Desk   Form should be Faxed/mailed to: N/A  Is patient requesting call for pickup:N/A  Form placed: [employer] charge sheet.  Provider will determine charge.N/A  Individual made aware of 3-5 business day turn around No?

## 2023-02-23 NOTE — Telephone Encounter (Signed)
Does not need a phone note, does not require a provider signature

## 2023-03-02 ENCOUNTER — Telehealth: Payer: Self-pay

## 2023-03-02 NOTE — Telephone Encounter (Signed)
Pt has picked up ?

## 2023-03-02 NOTE — Telephone Encounter (Signed)
Received Shipment of Ozempic 4mg /48mL for patient  4 boxes placed in the POD B refrigerator for storage  Patient has been called and made aware, she will pick up before the end of the week

## 2023-04-07 ENCOUNTER — Encounter: Payer: Self-pay | Admitting: Family Medicine

## 2023-04-07 NOTE — Progress Notes (Signed)
 Called Patient to let her know that transplant team can repeat those labs per Dr.Tabori. Patient did verbalize understanding.

## 2023-04-15 DIAGNOSIS — E872 Acidosis, unspecified: Secondary | ICD-10-CM | POA: Diagnosis not present

## 2023-04-15 DIAGNOSIS — G8929 Other chronic pain: Secondary | ICD-10-CM | POA: Diagnosis not present

## 2023-04-15 DIAGNOSIS — N185 Chronic kidney disease, stage 5: Secondary | ICD-10-CM | POA: Diagnosis not present

## 2023-04-15 DIAGNOSIS — E1122 Type 2 diabetes mellitus with diabetic chronic kidney disease: Secondary | ICD-10-CM | POA: Diagnosis not present

## 2023-04-15 DIAGNOSIS — I120 Hypertensive chronic kidney disease with stage 5 chronic kidney disease or end stage renal disease: Secondary | ICD-10-CM | POA: Diagnosis not present

## 2023-04-15 DIAGNOSIS — E039 Hypothyroidism, unspecified: Secondary | ICD-10-CM | POA: Diagnosis not present

## 2023-04-15 DIAGNOSIS — M549 Dorsalgia, unspecified: Secondary | ICD-10-CM | POA: Diagnosis not present

## 2023-04-16 LAB — LAB REPORT - SCANNED: EGFR: 8

## 2023-04-19 ENCOUNTER — Encounter: Payer: Self-pay | Admitting: Family Medicine

## 2023-05-15 ENCOUNTER — Other Ambulatory Visit: Payer: Self-pay | Admitting: Family Medicine

## 2023-05-19 DIAGNOSIS — E1122 Type 2 diabetes mellitus with diabetic chronic kidney disease: Secondary | ICD-10-CM | POA: Diagnosis not present

## 2023-05-19 DIAGNOSIS — I129 Hypertensive chronic kidney disease with stage 1 through stage 4 chronic kidney disease, or unspecified chronic kidney disease: Secondary | ICD-10-CM | POA: Diagnosis not present

## 2023-05-19 DIAGNOSIS — E785 Hyperlipidemia, unspecified: Secondary | ICD-10-CM | POA: Diagnosis not present

## 2023-05-19 DIAGNOSIS — I6523 Occlusion and stenosis of bilateral carotid arteries: Secondary | ICD-10-CM | POA: Diagnosis not present

## 2023-05-19 DIAGNOSIS — N184 Chronic kidney disease, stage 4 (severe): Secondary | ICD-10-CM | POA: Diagnosis not present

## 2023-06-16 DIAGNOSIS — E875 Hyperkalemia: Secondary | ICD-10-CM | POA: Diagnosis not present

## 2023-06-16 DIAGNOSIS — I120 Hypertensive chronic kidney disease with stage 5 chronic kidney disease or end stage renal disease: Secondary | ICD-10-CM | POA: Diagnosis not present

## 2023-06-16 DIAGNOSIS — E872 Acidosis, unspecified: Secondary | ICD-10-CM | POA: Diagnosis not present

## 2023-06-16 DIAGNOSIS — D649 Anemia, unspecified: Secondary | ICD-10-CM | POA: Diagnosis not present

## 2023-06-16 DIAGNOSIS — N185 Chronic kidney disease, stage 5: Secondary | ICD-10-CM | POA: Diagnosis not present

## 2023-06-16 DIAGNOSIS — G8929 Other chronic pain: Secondary | ICD-10-CM | POA: Diagnosis not present

## 2023-06-16 DIAGNOSIS — E1122 Type 2 diabetes mellitus with diabetic chronic kidney disease: Secondary | ICD-10-CM | POA: Diagnosis not present

## 2023-06-16 DIAGNOSIS — N2581 Secondary hyperparathyroidism of renal origin: Secondary | ICD-10-CM | POA: Diagnosis not present

## 2023-06-16 DIAGNOSIS — M549 Dorsalgia, unspecified: Secondary | ICD-10-CM | POA: Diagnosis not present

## 2023-08-17 DIAGNOSIS — I120 Hypertensive chronic kidney disease with stage 5 chronic kidney disease or end stage renal disease: Secondary | ICD-10-CM | POA: Diagnosis not present

## 2023-08-17 DIAGNOSIS — E1122 Type 2 diabetes mellitus with diabetic chronic kidney disease: Secondary | ICD-10-CM | POA: Diagnosis not present

## 2023-08-17 DIAGNOSIS — N2581 Secondary hyperparathyroidism of renal origin: Secondary | ICD-10-CM | POA: Diagnosis not present

## 2023-08-17 DIAGNOSIS — N185 Chronic kidney disease, stage 5: Secondary | ICD-10-CM | POA: Diagnosis not present

## 2023-08-17 DIAGNOSIS — E875 Hyperkalemia: Secondary | ICD-10-CM | POA: Diagnosis not present

## 2023-08-17 DIAGNOSIS — D649 Anemia, unspecified: Secondary | ICD-10-CM | POA: Diagnosis not present

## 2023-08-18 ENCOUNTER — Ambulatory Visit: Payer: Medicare HMO | Admitting: Family Medicine

## 2023-08-20 LAB — LAB REPORT - SCANNED: EGFR: 7

## 2023-08-27 ENCOUNTER — Telehealth (HOSPITAL_BASED_OUTPATIENT_CLINIC_OR_DEPARTMENT_OTHER): Payer: Self-pay | Admitting: Pharmacist

## 2023-08-27 ENCOUNTER — Telehealth: Payer: Self-pay | Admitting: Pharmacy Technician

## 2023-08-27 NOTE — Telephone Encounter (Signed)
 Faxed refill request for Ozempic  to [employer] patient assistance program. Paperwork labeled and sent to scan.

## 2023-08-27 NOTE — Telephone Encounter (Signed)
 Paperwork has been signed and back at the nurse station

## 2023-08-27 NOTE — Telephone Encounter (Addendum)
 Auth Submission: approved Site of care: Site of care: [facility] Payer: retacrit Medication & CPT/J Code(s) submitted:  Diagnosis Code:  Route of submission (phone, fax, portal):  Phone # Fax # Auth type: Buy/Bill HB 08/24/23 - 02/02/24 Units/visits requested: 20,000 units q4wks  Ref: [id number] Rep: Rosaria Barrows: [id number]  NPI has been updated to [id number]

## 2023-08-27 NOTE — Telephone Encounter (Signed)
 Paperwork from Tammy in Dr. Charis folder for review.

## 2023-08-27 NOTE — Telephone Encounter (Signed)
 Signed.

## 2023-08-27 NOTE — Telephone Encounter (Signed)
 Received fax that patient is due to have Ozempic  refilled thru the [employer] medication assistance program. She has been getting Ozempic  1mg .  Dr Mahlon is out of the office until August so will need to change provider to either Dr Levora or Dr Jerrell for this refill.  It has been taking about 2 to 4 weeks to receive the shipment from [employer]. Tried to call patient to see how much Ozempic  she has on hand. Had to leave message. CB# [phone] or [phone]

## 2023-08-30 NOTE — Telephone Encounter (Signed)
 Patient called back. I confirmed that she has at least 4 doses of Ozempic  on hand at home. This should last until [employer] ships her next medication assistance program refill.

## 2023-09-01 ENCOUNTER — Telehealth: Payer: Self-pay | Admitting: Family Medicine

## 2023-09-01 ENCOUNTER — Telehealth: Payer: Self-pay

## 2023-09-01 NOTE — Telephone Encounter (Signed)
 Received document for [employer] Assistance. Placed in providers folder for review and sign

## 2023-09-01 NOTE — Telephone Encounter (Signed)
 Encounter not needed

## 2023-09-01 NOTE — Telephone Encounter (Signed)
 [employer] stated they were missing the date where provider signed Found original with signature & date refaxed to [employer] - signed order is already in pt's chart

## 2023-09-02 LAB — HM DIABETES EYE EXAM

## 2023-09-03 NOTE — Telephone Encounter (Addendum)
 [employer] has faxed over the PAP application form - this time stating that the provider section is incomplete. All of section J needs to be filled out Form is in the provider bin with labels

## 2023-09-03 NOTE — Telephone Encounter (Signed)
 Information has been filled out Placed in folder

## 2023-09-08 ENCOUNTER — Ambulatory Visit (HOSPITAL_COMMUNITY)
Admission: RE | Admit: 2023-09-08 | Discharge: 2023-09-08 | Disposition: A | Discharge status: Home / Self Care | Source: Ambulatory Visit | Attending: Nephrology | Admitting: Nephrology

## 2023-09-08 VITALS — BP 154/85 | HR 67 | Temp 97.3°F | Resp 16

## 2023-09-08 DIAGNOSIS — N185 Chronic kidney disease, stage 5: Secondary | ICD-10-CM | POA: Diagnosis not present

## 2023-09-08 DIAGNOSIS — D631 Anemia in chronic kidney disease: Secondary | ICD-10-CM | POA: Insufficient documentation

## 2023-09-08 LAB — POCT HEMOGLOBIN-HEMACUE: Hemoglobin: 8.4 g/dL — ABNORMAL LOW (ref 12.0–15.0)

## 2023-09-08 MED ORDER — EPOETIN ALFA-EPBX 10000 UNIT/ML IJ SOLN
20000.0000 [IU] | INTRAMUSCULAR | Status: DC
  Administered 2023-09-08: 20000 [IU] via SUBCUTANEOUS

## 2023-09-08 MED ORDER — EPOETIN ALFA-EPBX 10000 UNIT/ML IJ SOLN
INTRAMUSCULAR | Status: AC
  Filled 2023-09-08: qty 2

## 2023-09-09 ENCOUNTER — Ambulatory Visit: Admitting: Family Medicine

## 2023-09-09 ENCOUNTER — Encounter: Payer: Self-pay | Admitting: Family Medicine

## 2023-09-09 VITALS — BP 148/72 | HR 68 | Temp 98.7°F | Ht 61.0 in | Wt 124.4 lb

## 2023-09-09 DIAGNOSIS — E1169 Type 2 diabetes mellitus with other specified complication: Secondary | ICD-10-CM | POA: Diagnosis not present

## 2023-09-09 DIAGNOSIS — N185 Chronic kidney disease, stage 5: Secondary | ICD-10-CM | POA: Diagnosis not present

## 2023-09-09 DIAGNOSIS — E785 Hyperlipidemia, unspecified: Secondary | ICD-10-CM | POA: Diagnosis not present

## 2023-09-09 DIAGNOSIS — I10 Essential (primary) hypertension: Secondary | ICD-10-CM

## 2023-09-09 DIAGNOSIS — Z7985 Long-term (current) use of injectable non-insulin antidiabetic drugs: Secondary | ICD-10-CM | POA: Diagnosis not present

## 2023-09-09 DIAGNOSIS — E1122 Type 2 diabetes mellitus with diabetic chronic kidney disease: Secondary | ICD-10-CM | POA: Diagnosis not present

## 2023-09-09 LAB — CBC WITH DIFFERENTIAL/PLATELET
Basophils Absolute: 0.1 K/uL (ref 0.0–0.1)
Basophils Relative: 0.9 % (ref 0.0–3.0)
Eosinophils Absolute: 0.7 K/uL (ref 0.0–0.7)
Eosinophils Relative: 10.7 % — ABNORMAL HIGH (ref 0.0–5.0)
HCT: 26.6 % — ABNORMAL LOW (ref 36.0–46.0)
Hemoglobin: 8.8 g/dL — ABNORMAL LOW (ref 12.0–15.0)
Lymphocytes Relative: 21.6 % (ref 12.0–46.0)
Lymphs Abs: 1.3 K/uL (ref 0.7–4.0)
MCHC: 32.9 g/dL (ref 30.0–36.0)
MCV: 84.8 fl (ref 78.0–100.0)
Monocytes Absolute: 0.4 K/uL (ref 0.1–1.0)
Monocytes Relative: 6.1 % (ref 3.0–12.0)
Neutro Abs: 3.7 K/uL (ref 1.4–7.7)
Neutrophils Relative %: 60.7 % (ref 43.0–77.0)
Platelets: 212 K/uL (ref 150.0–400.0)
RBC: 3.13 Mil/uL — ABNORMAL LOW (ref 3.87–5.11)
RDW: 13.9 % (ref 11.5–15.5)
WBC: 6.2 K/uL (ref 4.0–10.5)

## 2023-09-09 LAB — HEMOGLOBIN A1C: Hgb A1c MFr Bld: 5.7 % (ref 4.6–6.5)

## 2023-09-09 LAB — TSH: TSH: 2.32 u[IU]/mL (ref 0.35–5.50)

## 2023-09-09 MED ORDER — CARVEDILOL 12.5 MG PO TABS: 12.5000 mg | ORAL_TABLET | Freq: Two times a day (BID) | ORAL | 1 refills | Status: DC

## 2023-09-09 MED ORDER — AMLODIPINE BESYLATE 2.5 MG PO TABS: 2.5000 mg | ORAL_TABLET | Freq: Every day | ORAL | 0 refills | Status: DC

## 2023-09-09 NOTE — Patient Instructions (Signed)
 Schedule your complete physical in 6 months We'll notify you of your lab results and make any changes if needed Keep up the good work!  You look great! Start the Amlodipine  2.5mg  daily for the blood pressure Call with any questions or concerns Stay Safe!  Stay Healthy! Enjoy the rest of your summer!!!

## 2023-09-09 NOTE — Assessment & Plan Note (Signed)
 Chronic problem.  On Crestor 10mg  daily w/o difficulty.  Check labs.  Adjust meds prn

## 2023-09-09 NOTE — Assessment & Plan Note (Signed)
 Chronic problem.  Currently on Ozempic  1mg  weekly w/ hx of good control.  UTD on eye exam, microalbumin.  Foot exam done today.  Check labs.  Adjust meds prn

## 2023-09-09 NOTE — Progress Notes (Signed)
   Subjective:    Patient ID: Marissa Scott, female    DOB: Mar 04, 1965, 58 y.o.   MRN: [id number]  HPI DM- chronic problem, on Ozempic  1mg  weekly.  UTD on eye exam (requesting records), microalbumin.  Due for foot exam.  No numbness/tingling of hands/feet.  HTN- chronic problem, on Carvedilol  12.5mg  BID.  BP was elevated yesterday when she had her Retacrit  injxn.  No CP, SOB, HA's, visual changes, edema.  Hyperlipidemia- chronic problem, on Crestor  10mg  daily.  No abd pain, N/V.   Review of Systems For ROS see HPI     Objective:   Physical Exam Vitals reviewed.  Constitutional:      General: She is not in acute distress.    Appearance: Normal appearance. She is well-developed. She is not ill-appearing.  HENT:     Head: Normocephalic and atraumatic.  Eyes:     Conjunctiva/sclera: Conjunctivae normal.     Pupils: Pupils are equal, round, and reactive to light.  Neck:     Thyroid : No thyromegaly.  Cardiovascular:     Rate and Rhythm: Normal rate and regular rhythm.     Pulses: Normal pulses.     Heart sounds: Normal heart sounds. No murmur heard. Pulmonary:     Effort: Pulmonary effort is normal. No respiratory distress.     Breath sounds: Normal breath sounds.  Abdominal:     General: There is no distension.     Palpations: Abdomen is soft.     Tenderness: There is no abdominal tenderness.  Musculoskeletal:     Cervical back: Normal range of motion and neck supple.     Right lower leg: No edema.     Left lower leg: No edema.  Lymphadenopathy:     Cervical: No cervical adenopathy.  Skin:    General: Skin is warm and dry.  Neurological:     General: No focal deficit present.     Mental Status: She is alert and oriented to person, place, and time.  Psychiatric:        Mood and Affect: Mood normal.        Behavior: Behavior normal.        Thought Content: Thought content normal.           Assessment & Plan:

## 2023-09-09 NOTE — Assessment & Plan Note (Signed)
 Chronic problem.  Currently on Carvedilol  12.5mg  BID but BP has been increasing at last few appts.  Due to renal dysfxn, will start Amlodipine  2.5mg  daily and monitor.  She has appt at [facility] on Monday- will check BP at that time.  Pt expressed understanding and is in agreement w/ plan.

## 2023-09-10 ENCOUNTER — Ambulatory Visit: Payer: Self-pay | Admitting: Family Medicine

## 2023-09-10 ENCOUNTER — Telehealth: Payer: Self-pay

## 2023-09-10 LAB — HEPATIC FUNCTION PANEL
ALT: 13 U/L (ref 0–35)
AST: 14 U/L (ref 0–37)
Albumin: 3.7 g/dL (ref 3.5–5.2)
Alkaline Phosphatase: 57 U/L (ref 39–117)
Bilirubin, Direct: 0.1 mg/dL (ref 0.0–0.3)
Total Bilirubin: 0.2 mg/dL (ref 0.2–1.2)
Total Protein: 6.4 g/dL (ref 6.0–8.3)

## 2023-09-10 LAB — LIPID PANEL
Cholesterol: 153 mg/dL (ref 0–200)
HDL: 27.3 mg/dL — ABNORMAL LOW (ref >39.00)
LDL Cholesterol: 107 mg/dL — ABNORMAL HIGH (ref 0–99)
NonHDL: 125.55
Total CHOL/HDL Ratio: 6
Triglycerides: 94 mg/dL (ref 0.0–149.0)
VLDL: 18.8 mg/dL (ref 0.0–40.0)

## 2023-09-10 LAB — BASIC METABOLIC PANEL WITH GFR
BUN: 70 mg/dL — ABNORMAL HIGH (ref 6–23)
CO2: 18 meq/L — ABNORMAL LOW (ref 19–32)
Calcium: 8.5 mg/dL (ref 8.4–10.5)
Chloride: 108 meq/L (ref 96–112)
Creatinine, Ser: 5.87 mg/dL (ref 0.40–1.20)
GFR: 7.39 mL/min — CL (ref >60.00)
Glucose, Bld: 98 mg/dL (ref 70–99)
Potassium: 5.2 meq/L — ABNORMAL HIGH (ref 3.5–5.1)
Sodium: 140 meq/L (ref 135–145)

## 2023-09-10 NOTE — Telephone Encounter (Signed)
 Form completed and returned to [employer]

## 2023-09-10 NOTE — Telephone Encounter (Signed)
 Critical lab call: Creatinine 5.87 GFR: 7.39 PCP notified via teams and telephone note

## 2023-09-10 NOTE — Telephone Encounter (Signed)
 Aware of renal issues- pt is waiting on kidney transplant.  No further action needed

## 2023-09-10 NOTE — Progress Notes (Signed)
 LVM to call office.

## 2023-09-13 DIAGNOSIS — R9439 Abnormal result of other cardiovascular function study: Secondary | ICD-10-CM | POA: Diagnosis not present

## 2023-09-13 NOTE — Telephone Encounter (Signed)
 Faxed back [phone]

## 2023-09-14 NOTE — Telephone Encounter (Signed)
 Attached already completed form to this and placed in sign folder for review and sign

## 2023-09-14 NOTE — Telephone Encounter (Addendum)
 This PAP application has been faxed over once again - missing information Form has been placed in provider bin

## 2023-09-15 NOTE — Telephone Encounter (Signed)
 Form completed and returned to [place]

## 2023-09-16 NOTE — Telephone Encounter (Signed)
 Faxed back to the requested fax number ? ?

## 2023-09-23 DIAGNOSIS — I2582 Chronic total occlusion of coronary artery: Secondary | ICD-10-CM | POA: Diagnosis not present

## 2023-09-23 DIAGNOSIS — I2510 Atherosclerotic heart disease of native coronary artery without angina pectoris: Secondary | ICD-10-CM | POA: Diagnosis not present

## 2023-09-23 DIAGNOSIS — R079 Chest pain, unspecified: Secondary | ICD-10-CM | POA: Diagnosis not present

## 2023-09-23 DIAGNOSIS — R9439 Abnormal result of other cardiovascular function study: Secondary | ICD-10-CM | POA: Diagnosis not present

## 2023-09-28 DIAGNOSIS — E875 Hyperkalemia: Secondary | ICD-10-CM | POA: Diagnosis not present

## 2023-09-28 DIAGNOSIS — E1122 Type 2 diabetes mellitus with diabetic chronic kidney disease: Secondary | ICD-10-CM | POA: Diagnosis not present

## 2023-09-28 DIAGNOSIS — I120 Hypertensive chronic kidney disease with stage 5 chronic kidney disease or end stage renal disease: Secondary | ICD-10-CM | POA: Diagnosis not present

## 2023-09-28 DIAGNOSIS — E119 Type 2 diabetes mellitus without complications: Secondary | ICD-10-CM | POA: Diagnosis not present

## 2023-09-28 DIAGNOSIS — N2581 Secondary hyperparathyroidism of renal origin: Secondary | ICD-10-CM | POA: Diagnosis not present

## 2023-09-28 DIAGNOSIS — N185 Chronic kidney disease, stage 5: Secondary | ICD-10-CM | POA: Diagnosis not present

## 2023-09-28 DIAGNOSIS — D649 Anemia, unspecified: Secondary | ICD-10-CM | POA: Diagnosis not present

## 2023-09-29 NOTE — Telephone Encounter (Signed)
 Patient's [employer] PAP was approved and her shipment of Ozempic  arrived 09/29/23. Patient has been notified that it's here.

## 2023-10-06 ENCOUNTER — Encounter (HOSPITAL_COMMUNITY)

## 2023-10-10 ENCOUNTER — Other Ambulatory Visit (HOSPITAL_COMMUNITY): Payer: Self-pay | Admitting: Nephrology

## 2023-10-10 DIAGNOSIS — D631 Anemia in chronic kidney disease: Secondary | ICD-10-CM | POA: Insufficient documentation

## 2023-10-11 ENCOUNTER — Other Ambulatory Visit: Payer: Self-pay | Admitting: Family Medicine

## 2023-10-11 DIAGNOSIS — I259 Chronic ischemic heart disease, unspecified: Secondary | ICD-10-CM | POA: Diagnosis not present

## 2023-10-13 ENCOUNTER — Inpatient Hospital Stay (HOSPITAL_COMMUNITY): Admission: RE | Admit: 2023-10-13 | Source: Ambulatory Visit

## 2023-10-13 ENCOUNTER — Ambulatory Visit (HOSPITAL_COMMUNITY)
Admission: RE | Admit: 2023-10-13 | Discharge: 2023-10-13 | Disposition: A | Discharge status: Home / Self Care | Source: Ambulatory Visit | Attending: Nephrology | Admitting: Nephrology

## 2023-10-13 VITALS — BP 149/64 | HR 63 | Temp 97.2°F | Resp 16

## 2023-10-13 DIAGNOSIS — D631 Anemia in chronic kidney disease: Secondary | ICD-10-CM | POA: Diagnosis not present

## 2023-10-13 DIAGNOSIS — N185 Chronic kidney disease, stage 5: Secondary | ICD-10-CM | POA: Diagnosis not present

## 2023-10-13 LAB — IRON AND TIBC
Iron: 56 ug/dL (ref 28–170)
Saturation Ratios: 23 % (ref 10.4–31.8)
TIBC: 244 ug/dL — ABNORMAL LOW (ref 250–450)
UIBC: 188 ug/dL

## 2023-10-13 LAB — FERRITIN: Ferritin: 160 ng/mL (ref 11–307)

## 2023-10-13 MED ORDER — EPOETIN ALFA-EPBX 20000 UNIT/ML IJ SOLN
20000.0000 [IU] | Freq: Once | INTRAMUSCULAR | Status: AC
  Administered 2023-10-13: 20000 [IU] via SUBCUTANEOUS

## 2023-10-13 MED ORDER — EPOETIN ALFA-EPBX 20000 UNIT/ML IJ SOLN
INTRAMUSCULAR | Status: AC
  Filled 2023-10-13: qty 1

## 2023-10-14 LAB — POCT HEMOGLOBIN-HEMACUE: Hemoglobin: 8.1 g/dL — ABNORMAL LOW (ref 12.0–15.0)

## 2023-10-15 ENCOUNTER — Ambulatory Visit (HOSPITAL_COMMUNITY)
Admission: RE | Admit: 2023-10-15 | Discharge: 2023-10-15 | Disposition: A | Discharge status: Home / Self Care | Attending: Vascular Surgery | Admitting: Vascular Surgery

## 2023-10-15 ENCOUNTER — Encounter (HOSPITAL_COMMUNITY)
Admission: RE | Disposition: A | Discharge status: Home / Self Care | Payer: Self-pay | Source: Home / Self Care | Attending: Vascular Surgery

## 2023-10-15 ENCOUNTER — Other Ambulatory Visit: Payer: Self-pay

## 2023-10-15 DIAGNOSIS — Z7985 Long-term (current) use of injectable non-insulin antidiabetic drugs: Secondary | ICD-10-CM | POA: Insufficient documentation

## 2023-10-15 DIAGNOSIS — I2510 Atherosclerotic heart disease of native coronary artery without angina pectoris: Secondary | ICD-10-CM | POA: Diagnosis not present

## 2023-10-15 DIAGNOSIS — I120 Hypertensive chronic kidney disease with stage 5 chronic kidney disease or end stage renal disease: Secondary | ICD-10-CM | POA: Diagnosis not present

## 2023-10-15 DIAGNOSIS — N185 Chronic kidney disease, stage 5: Secondary | ICD-10-CM | POA: Insufficient documentation

## 2023-10-15 DIAGNOSIS — E1122 Type 2 diabetes mellitus with diabetic chronic kidney disease: Secondary | ICD-10-CM | POA: Insufficient documentation

## 2023-10-15 DIAGNOSIS — Z79899 Other long term (current) drug therapy: Secondary | ICD-10-CM | POA: Diagnosis not present

## 2023-10-15 LAB — GLUCOSE, CAPILLARY: Glucose-Capillary: 93 mg/dL (ref 70–99)

## 2023-10-15 SURGERY — DIALYSIS/PERMA CATHETER INSERTION
Anesthesia: LOCAL | Site: Chest

## 2023-10-15 MED ORDER — MIDAZOLAM HCL 2 MG/2ML IJ SOLN
INTRAMUSCULAR | Status: DC | PRN
  Administered 2023-10-15: 1 mg via INTRAVENOUS

## 2023-10-15 MED ORDER — HEPARIN SODIUM (PORCINE) 1000 UNIT/ML IJ SOLN
INTRAMUSCULAR | Status: AC
  Filled 2023-10-15: qty 10

## 2023-10-15 MED ORDER — MIDAZOLAM HCL 2 MG/2ML IJ SOLN
INTRAMUSCULAR | Status: AC
  Filled 2023-10-15: qty 2

## 2023-10-15 MED ORDER — HEPARIN (PORCINE) IN NACL 1000-0.9 UT/500ML-% IV SOLN
INTRAVENOUS | Status: DC | PRN
  Administered 2023-10-15: 500 mL

## 2023-10-15 MED ORDER — HEPARIN SODIUM (PORCINE) 1000 UNIT/ML IJ SOLN
INTRAMUSCULAR | Status: DC | PRN
  Administered 2023-10-15: 3200 [IU] via INTRAVENOUS

## 2023-10-15 MED ORDER — FENTANYL CITRATE (PF) 100 MCG/2ML IJ SOLN
INTRAMUSCULAR | Status: AC
  Filled 2023-10-15: qty 2

## 2023-10-15 MED ORDER — LIDOCAINE HCL (PF) 1 % IJ SOLN
INTRAMUSCULAR | Status: AC
  Filled 2023-10-15: qty 30

## 2023-10-15 MED ORDER — FENTANYL CITRATE (PF) 100 MCG/2ML IJ SOLN
INTRAMUSCULAR | Status: DC | PRN
  Administered 2023-10-15: 25 ug via INTRAVENOUS

## 2023-10-15 MED ORDER — LIDOCAINE HCL (PF) 1 % IJ SOLN
INTRAMUSCULAR | Status: DC | PRN
  Administered 2023-10-15: 30 mL via INTRADERMAL

## 2023-10-15 SURGICAL SUPPLY — 4 items
CATH PALINDROME-P 19CM W/VT (CATHETERS) IMPLANT
KIT MICROPUNCTURE NIT STIFF (SHEATH) IMPLANT
KIT PV (KITS) ×1 IMPLANT
TRAY PV CATH (CUSTOM PROCEDURE TRAY) ×1 IMPLANT

## 2023-10-15 NOTE — H&P (Signed)
 H&P     History of Present Illness: This is a 58 y.o. female with CKD 5 and CAD that presents for tunneled dialysis catheter placement.  Patient is undergoing workup for CABG at [facility] with severe coronary disease.  Given almost certain need for dialysis after CABG they recommended tunneled catheter placement.  No chest wall implants.  Past Medical History:  Diagnosis Date   Anemia    Arthritis    DIABETES MELLITUS, TYPE II 01/30/2008   HYPERLIPIDEMIA 01/30/2008   HYPERTENSION 01/30/2008   HYPERURICEMIA 10/01/2008   Hypothyroidism    Obesity    Polycystic ovaries 01/04/2009   RENAL DISEASE, CHRONIC, STAGE III 01/30/2008   Follows w/ Renal    Past Surgical History:  Procedure Laterality Date   APPENDECTOMY     COLONOSCOPY  03/05/2014   ESOPHAGOGASTRODUODENOSCOPY     LUMBAR FUSION     L4-5   LUMBAR LAMINECTOMY     L4-L5   NEPHRECTOMY     left removed, partial right-Ottelin   Neural Stimulator     POLYPECTOMY     Right hand cyst     TUBAL LIGATION     GYN Mezer    Allergies  Allergen Reactions   Egg-Derived Products Itching   Enalapril  Maleate    Penicillin G Nausea And Vomiting   Penicillins Rash    Prior to Admission medications   Medication Sig Start Date End Date Taking? Authorizing Provider  acetaminophen  (TYLENOL ) 650 MG CR tablet Take 650 mg by mouth every 6 (six) hours.    [provider]  amLODipine  (NORVASC ) 2.5 MG tablet Take 1 tablet (2.5 mg total) by mouth daily. 09/09/23   Tabori, Katherine E, MD  carvedilol  (COREG ) 12.5 MG tablet Take 1 tablet (12.5 mg total) by mouth 2 (two) times daily with a meal. 09/09/23   Mahlon Comer BRAVO, MD  levothyroxine  (SYNTHROID ) 88 MCG tablet TAKE 1 TABLET EVERY DAY 05/18/23   Tabori, Katherine E, MD  Multiple Vitamin (MULTI-VITAMINS) TABS Take 1 tablet by mouth daily.    [provider]  rosuvastatin  (CRESTOR ) 10 MG tablet TAKE 1 TABLET AT BEDTIME 10/11/23   Tabori, Katherine E, MD  Semaglutide , 1  MG/DOSE, 4 MG/3ML SOPN Inject 1 mg as directed once a week. 02/17/23   Tabori, Katherine E, MD  triamcinolone  cream (KENALOG ) 0.1 % Apply 1 Application topically 2 (two) times daily. 02/17/23   Mahlon Comer BRAVO, MD    Social History   Socioeconomic History   Marital status: Married    Spouse name: Not on file   Number of children: 1   Years of education: Not on file   Highest education level: Not on file  Occupational History   Occupation: Disabled  Tobacco Use   Smoking status: Never   Smokeless tobacco: Never  Vaping Use   Vaping status: Never Used  Substance and Sexual Activity   Alcohol use: Yes    Comment: rarely   Drug use: No   Sexual activity: Yes  Other Topics Concern   Not on file  Social History Narrative   Disabled for back pain   Social Drivers of Health   Financial Resource Strain: Low Risk  (08/26/2022)   Overall Financial Resource Strain (CARDIA)    Difficulty of Paying Living Expenses: Not hard at all  Food Insecurity: Low Risk  (10/11/2023)   Received from [facility]   Hunger Vital Sign    Within the past 12 months, you worried that your food would  run out before you got money to buy more: Never true    Within the past 12 months, the food you bought just didn't last and you didn't have money to get more. : Never true  Transportation Needs: No Transportation Needs (10/11/2023)   Received from [facility]   Transportation    In the past 12 months, has lack of reliable transportation kept you from medical appointments, meetings, work or from getting things needed for daily living? : No  Physical Activity: Inactive (08/26/2022)   Exercise Vital Sign    Days of Exercise per Week: 0 days    Minutes of Exercise per Session: 0 min  Stress: No Stress Concern Present (08/26/2022)   [employer] of Occupational Health - Occupational Stress Questionnaire    Feeling of Stress : Not at all  Social Connections: Moderately Isolated (08/26/2022)   Social  Connection and Isolation Panel    Frequency of Communication with Friends and Family: More than three times a week    Frequency of Social Gatherings with Friends and Family: More than three times a week    Attends Religious Services: Never    Database administrator or Organizations: No    Attends Banker Meetings: Never    Marital Status: Married  Catering manager Violence: Not At Risk (08/26/2022)   Humiliation, Afraid, Rape, and Kick questionnaire    Fear of Current or Ex-Partner: No    Emotionally Abused: No    Physically Abused: No    Sexually Abused: No     Family History  Problem Relation Age of Onset   Hypertension Father    Heart disease Father        CAD   Diabetes Father    Stroke Father    Colon cancer Father 82   Colon polyps Father    Colon cancer Cousin 50       Lynch Syndrome   Stomach cancer Cousin 44   Kidney cancer Cousin    Lung cancer Maternal Aunt 60       history of smoking   Esophageal cancer Neg Hx    Rectal cancer Neg Hx     ROS: [x]  Positive   [ ]  Negative   [ ]  All sytems reviewed and are negative  Cardiovascular: []  chest pain/pressure []  palpitations []  SOB lying flat []  DOE []  pain in legs while walking []  pain in legs at rest []  pain in legs at night []  non-healing ulcers []  hx of DVT []  swelling in legs  Pulmonary: []  productive cough []  asthma/wheezing []  home O2  Neurologic: []  weakness in []  arms []  legs []  numbness in []  arms []  legs []  hx of CVA []  mini stroke [] difficulty speaking or slurred speech []  temporary loss of vision in one eye []  dizziness  Hematologic: []  hx of cancer []  bleeding problems []  problems with blood clotting easily  Endocrine:   []  diabetes []  thyroid  disease  GI []  vomiting blood []  blood in stool  GU: []  CKD/renal failure []  HD--[]  M/W/F or []  T/T/S []  burning with urination []  blood in urine  Psychiatric: []  anxiety []  depression  Musculoskeletal: []   arthritis []  joint pain  Integumentary: []  rashes []  ulcers  Constitutional: []  fever []  chills   Physical Examination  Vitals:   10/15/23 0953 10/15/23 1010  BP: (!) 160/73 (!) 142/83  Pulse: 74 74  Resp: 12 14  Temp: 97.9 F (36.6 C)   SpO2: 100% 100%   There  is no height or weight on file to calculate BMI.  General:  WDWN in NAD Gait: Not observed HENT: WNL, normocephalic Pulmonary: normal non-labored breathing Cardiac: regular, without  Murmurs, rubs or gallops Abdomen:  soft, NT/ND Vascular Exam/Pulses: No chest wall implants  CBC    Component Value Date/Time   WBC 6.2 09/09/2023 1320   RBC 3.13 (L) 09/09/2023 1320   HGB 8.1 (L) 10/13/2023 1014   HCT 26.6 (L) 09/09/2023 1320   PLT 212.0 09/09/2023 1320   PLT 367 01/21/2007 0000   MCV 84.8 09/09/2023 1320   MCH 28.3 05/03/2020 1431   MCHC 32.9 09/09/2023 1320   RDW 13.9 09/09/2023 1320   LYMPHSABS 1.3 09/09/2023 1320   MONOABS 0.4 09/09/2023 1320   EOSABS 0.7 09/09/2023 1320   BASOSABS 0.1 09/09/2023 1320    BMET    Component Value Date/Time   NA 140 09/09/2023 1320   K 5.2 (H) 09/09/2023 1320   CL 108 09/09/2023 1320   CO2 18 (L) 09/09/2023 1320   GLUCOSE 98 09/09/2023 1320   GLUCOSE 137 08/30/2007 0000   BUN 70 (H) 09/09/2023 1320   CREATININE 5.87 (HH) 09/09/2023 1320   CREATININE 2.64 (H) 05/03/2020 1431   CALCIUM  8.5 09/09/2023 1320   GFRNONAA 30 (L) 10/11/2014 2329   GFRAA 34 (L) 10/11/2014 2329    COAGS: No results found for: INR, PROTIME   Non-Invasive Vascular Imaging:    N/A   ASSESSMENT/PLAN: This is a 58 y.o. female with CKD 5 and CAD that presents for tunneled dialysis catheter placement.  Patient is undergoing workup for CABG at [facility] with severe coronary disease.  Given almost certain need for dialysis after CABG they recommended tunneled catheter placement.   Discussed plan for IJ access and tunneled catheter placement.  All questions answered.  Lonni DOROTHA Gaskins, MD [facility] Office: [phone]  Lonni JINNY Gaskins

## 2023-10-15 NOTE — Op Note (Signed)
    Patient name: Marissa Scott MRN: [id number] DOB: [birth year] Sex: female  10/15/2023 Pre-operative Diagnosis: CKD stage V with need for HD Post-operative diagnosis:  Same Surgeon:  Lonni DOROTHA Gaskins, MD Procedure Performed: 1.  Ultrasound-guided access right internal jugular vein 2.  Placement of right internal jugular vein tunneled dialysis catheter (19 cm palindrome) 3.  12 minutes of monitored moderate conscious sedation time  Indications: 58 year old female with stage V CKD with need for CABG due to severe underlying coronary artery disease.  Presents for tunneled dialysis catheter placement after risk benefits discussed.  Findings:   Ultrasound-guided access right internal jugular vein.  Placement of 19 cm palindrome catheter with tip in the right atrium.  This flushed and aspirated easily.   Procedure:  The patient was identified in the holding area and taken to [name] lab.  Placed on the table in the supine position.  The right neck and right chest wall were then prepped and draped in standard sterile fashion.  Timeout was performed.  Ultimately 1% lidocaine  without epinephrine was used to anesthetize the skin of the right neck and chest wall.  I used ultrasound access the right IJ under ultrasound guidance the micro needle placed a microwire and a micro sheath.  I then gave 1 mg of versed  and 25 mcg of fentanyl .  The J-wire was advanced into the right atrium under fluoroscopic guidance.  I measured a 19 cm palindrome catheter on the chest wall.  I made a counterincision and then tunneled the catheter making sure the cuff was under the skin.  Dilated over the wire placed a large dilator peel-away sheath.  The catheter was placed into the right atrium and the sheath was peeled away.  This was position under fluoroscopy.  It flushed and aspirated easily.  A loaded heparin  according to manufactures recommendations.  Taken to recovery in stable condition.    Lonni DOROTHA Gaskins,  MD [facility] [place] Office: [phone]

## 2023-10-18 ENCOUNTER — Encounter (HOSPITAL_COMMUNITY): Payer: Self-pay | Admitting: Vascular Surgery

## 2023-10-18 DIAGNOSIS — N2581 Secondary hyperparathyroidism of renal origin: Secondary | ICD-10-CM | POA: Diagnosis not present

## 2023-10-18 DIAGNOSIS — N186 End stage renal disease: Secondary | ICD-10-CM | POA: Diagnosis not present

## 2023-10-18 DIAGNOSIS — Z992 Dependence on renal dialysis: Secondary | ICD-10-CM | POA: Diagnosis not present

## 2023-10-20 DIAGNOSIS — N186 End stage renal disease: Secondary | ICD-10-CM | POA: Diagnosis not present

## 2023-10-20 DIAGNOSIS — N2581 Secondary hyperparathyroidism of renal origin: Secondary | ICD-10-CM | POA: Diagnosis not present

## 2023-10-20 DIAGNOSIS — Z992 Dependence on renal dialysis: Secondary | ICD-10-CM | POA: Diagnosis not present

## 2023-10-21 DIAGNOSIS — I120 Hypertensive chronic kidney disease with stage 5 chronic kidney disease or end stage renal disease: Secondary | ICD-10-CM | POA: Diagnosis not present

## 2023-10-21 DIAGNOSIS — N186 End stage renal disease: Secondary | ICD-10-CM | POA: Diagnosis not present

## 2023-10-21 DIAGNOSIS — Z992 Dependence on renal dialysis: Secondary | ICD-10-CM | POA: Diagnosis not present

## 2023-10-21 DIAGNOSIS — N2581 Secondary hyperparathyroidism of renal origin: Secondary | ICD-10-CM | POA: Diagnosis not present

## 2023-10-22 DIAGNOSIS — N2581 Secondary hyperparathyroidism of renal origin: Secondary | ICD-10-CM | POA: Diagnosis not present

## 2023-10-22 DIAGNOSIS — Z992 Dependence on renal dialysis: Secondary | ICD-10-CM | POA: Diagnosis not present

## 2023-10-22 DIAGNOSIS — N186 End stage renal disease: Secondary | ICD-10-CM | POA: Diagnosis not present

## 2023-10-25 DIAGNOSIS — N2581 Secondary hyperparathyroidism of renal origin: Secondary | ICD-10-CM | POA: Diagnosis not present

## 2023-10-25 DIAGNOSIS — N186 End stage renal disease: Secondary | ICD-10-CM | POA: Diagnosis not present

## 2023-10-25 DIAGNOSIS — Z992 Dependence on renal dialysis: Secondary | ICD-10-CM | POA: Diagnosis not present

## 2023-10-26 DIAGNOSIS — N2581 Secondary hyperparathyroidism of renal origin: Secondary | ICD-10-CM | POA: Diagnosis not present

## 2023-10-26 DIAGNOSIS — N186 End stage renal disease: Secondary | ICD-10-CM | POA: Diagnosis not present

## 2023-10-26 DIAGNOSIS — Z992 Dependence on renal dialysis: Secondary | ICD-10-CM | POA: Diagnosis not present

## 2023-10-28 DIAGNOSIS — Z992 Dependence on renal dialysis: Secondary | ICD-10-CM | POA: Diagnosis not present

## 2023-10-28 DIAGNOSIS — N186 End stage renal disease: Secondary | ICD-10-CM | POA: Diagnosis not present

## 2023-10-28 DIAGNOSIS — N2581 Secondary hyperparathyroidism of renal origin: Secondary | ICD-10-CM | POA: Diagnosis not present

## 2023-10-29 DIAGNOSIS — N186 End stage renal disease: Secondary | ICD-10-CM | POA: Diagnosis not present

## 2023-10-29 DIAGNOSIS — Z992 Dependence on renal dialysis: Secondary | ICD-10-CM | POA: Diagnosis not present

## 2023-10-29 DIAGNOSIS — N2581 Secondary hyperparathyroidism of renal origin: Secondary | ICD-10-CM | POA: Diagnosis not present

## 2023-11-01 DIAGNOSIS — Z992 Dependence on renal dialysis: Secondary | ICD-10-CM | POA: Diagnosis not present

## 2023-11-01 DIAGNOSIS — N186 End stage renal disease: Secondary | ICD-10-CM | POA: Diagnosis not present

## 2023-11-01 DIAGNOSIS — N2581 Secondary hyperparathyroidism of renal origin: Secondary | ICD-10-CM | POA: Diagnosis not present

## 2023-11-02 DIAGNOSIS — N186 End stage renal disease: Secondary | ICD-10-CM | POA: Diagnosis not present

## 2023-11-02 DIAGNOSIS — N2581 Secondary hyperparathyroidism of renal origin: Secondary | ICD-10-CM | POA: Diagnosis not present

## 2023-11-02 DIAGNOSIS — Z992 Dependence on renal dialysis: Secondary | ICD-10-CM | POA: Diagnosis not present

## 2023-11-04 DIAGNOSIS — Z992 Dependence on renal dialysis: Secondary | ICD-10-CM | POA: Diagnosis not present

## 2023-11-04 DIAGNOSIS — N186 End stage renal disease: Secondary | ICD-10-CM | POA: Diagnosis not present

## 2023-11-04 DIAGNOSIS — N2581 Secondary hyperparathyroidism of renal origin: Secondary | ICD-10-CM | POA: Diagnosis not present

## 2023-11-05 DIAGNOSIS — N186 End stage renal disease: Secondary | ICD-10-CM | POA: Diagnosis not present

## 2023-11-08 DIAGNOSIS — N2581 Secondary hyperparathyroidism of renal origin: Secondary | ICD-10-CM | POA: Diagnosis not present

## 2023-11-09 DIAGNOSIS — N186 End stage renal disease: Secondary | ICD-10-CM | POA: Diagnosis not present

## 2023-11-10 ENCOUNTER — Encounter (HOSPITAL_COMMUNITY)

## 2023-11-10 ENCOUNTER — Inpatient Hospital Stay (HOSPITAL_COMMUNITY): Admission: RE | Admit: 2023-11-10 | Source: Ambulatory Visit

## 2023-11-12 DIAGNOSIS — Z992 Dependence on renal dialysis: Secondary | ICD-10-CM | POA: Diagnosis not present

## 2023-11-15 DIAGNOSIS — N186 End stage renal disease: Secondary | ICD-10-CM | POA: Diagnosis not present

## 2023-11-15 DIAGNOSIS — N2581 Secondary hyperparathyroidism of renal origin: Secondary | ICD-10-CM | POA: Diagnosis not present

## 2023-11-18 DIAGNOSIS — Z992 Dependence on renal dialysis: Secondary | ICD-10-CM | POA: Diagnosis not present

## 2023-11-18 DIAGNOSIS — N2581 Secondary hyperparathyroidism of renal origin: Secondary | ICD-10-CM | POA: Diagnosis not present

## 2023-11-22 DIAGNOSIS — Z992 Dependence on renal dialysis: Secondary | ICD-10-CM | POA: Diagnosis not present

## 2023-11-22 DIAGNOSIS — N2581 Secondary hyperparathyroidism of renal origin: Secondary | ICD-10-CM | POA: Diagnosis not present

## 2023-11-26 DIAGNOSIS — Z992 Dependence on renal dialysis: Secondary | ICD-10-CM | POA: Diagnosis not present

## 2023-11-26 DIAGNOSIS — N2581 Secondary hyperparathyroidism of renal origin: Secondary | ICD-10-CM | POA: Diagnosis not present

## 2023-11-28 ENCOUNTER — Encounter: Payer: Self-pay | Admitting: Family Medicine

## 2023-11-29 ENCOUNTER — Other Ambulatory Visit: Payer: Self-pay | Admitting: Family Medicine

## 2023-11-29 DIAGNOSIS — N186 End stage renal disease: Secondary | ICD-10-CM | POA: Diagnosis not present

## 2023-11-29 DIAGNOSIS — Z992 Dependence on renal dialysis: Secondary | ICD-10-CM | POA: Diagnosis not present

## 2023-11-29 DIAGNOSIS — N2581 Secondary hyperparathyroidism of renal origin: Secondary | ICD-10-CM | POA: Diagnosis not present

## 2023-11-30 ENCOUNTER — Other Ambulatory Visit: Payer: Self-pay | Admitting: Pharmacist

## 2023-11-30 DIAGNOSIS — N186 End stage renal disease: Secondary | ICD-10-CM | POA: Diagnosis not present

## 2023-11-30 NOTE — Telephone Encounter (Signed)
 Opened in error

## 2023-12-01 ENCOUNTER — Other Ambulatory Visit: Payer: Self-pay | Admitting: Family Medicine

## 2023-12-02 ENCOUNTER — Other Ambulatory Visit: Payer: Self-pay | Admitting: Pharmacist

## 2023-12-02 DIAGNOSIS — Z992 Dependence on renal dialysis: Secondary | ICD-10-CM | POA: Diagnosis not present

## 2023-12-02 DIAGNOSIS — N2581 Secondary hyperparathyroidism of renal origin: Secondary | ICD-10-CM | POA: Diagnosis not present

## 2023-12-02 NOTE — Progress Notes (Signed)
 12/02/2023 Name: Marissa Scott MRN: [id number] DOB: [birth year]  Chief Complaint  Patient presents with   Medication Management    Ozempic  PAP    Subjective:  Marissa Scott is a 58 y.o. year old female. Marissa Scott received Ozempic  from [employer] patient assistance program in 2025. [employer] notified providers that in 2026 Medicare patients with coverage for Ozempic  would not be eligible but patient assistance program however patient sent me a MyChart message that [employer] recommended she reapply.   Hi Marissa Scott I just received a call from [employer]. The lady said they would need y'all to fill out paperwork in order to get Ozempic  for next year. I have filled an Rx with [facility] that y'all will be getting an approval for but could you please fill out the paperwork they fax to you in hopes it's approved for next year too? Marissa Scott   They were referred to the pharmacist by their PCP for assistance in managing medication access.     Medication Access/Adherence  Current Pharmacy:  [name] [address] [place] - [address] [place] [address] Phone: [phone] Fax: [phone]  [facility] - [place], [place] - [address] [place] [address] Phone: [phone] Fax: [phone]   Patient reports affordability concerns with their medications: Yes  Patient reports access/transportation concerns to their pharmacy: No  Patient reports adherence concerns with their medications:  No    Patient is taking Ozempic  1mg  weekly. Last received #4 boxes of Ozempic  09/01/2023 from [employer] patient assistance program.    Objective:  Lab Results  Component Value Date   HGBA1C 5.7 09/09/2023    Lab Results  Component Value Date   CREATININE 5.87 (HH) 09/09/2023   BUN 70 (H) 09/09/2023   NA 140 09/09/2023   K 5.2 (H) 09/09/2023   CL 108 09/09/2023   CO2 18 (L)  09/09/2023    Lab Results  Component Value Date   CHOL 153 09/09/2023   HDL 27.30 (L) 09/09/2023   LDLCALC 107 (H) 09/09/2023   LDLDIRECT 153.0 01/03/2020   TRIG 94.0 09/09/2023   CHOLHDL 6 09/09/2023    Medications Reviewed Today     Reviewed by Carla Milling, RPH-CPP (Pharmacist) on 12/02/23 at 1518  Med List Status: <None>   Medication Order Taking? Sig Documenting Provider Last Dose Status Informant  acetaminophen  (TYLENOL ) 650 MG CR tablet [id number]  Take 650 mg by mouth every 6 (six) hours. [provider]  Active   amLODipine  (NORVASC ) 2.5 MG tablet [id number]  TAKE 1 TABLET EVERY DAY Tabori, Katherine E, MD  Active   carvedilol  (COREG ) 12.5 MG tablet [id number]  Take 1 tablet (12.5 mg total) by mouth 2 (two) times daily with a meal. Mahlon Comer BRAVO, MD  Active   levothyroxine  (SYNTHROID ) 88 MCG tablet [id number]  TAKE 1 TABLET EVERY DAY Tabori, Katherine E, MD  Active   Multiple Vitamin (MULTI-VITAMINS) TABS [id number]  Take 1 tablet by mouth daily. [provider]  Active Self           Med Note Scott, Marissa W   Thu Oct 11, 2014 10:50 PM) ....  rosuvastatin  (CRESTOR ) 10 MG tablet [id number]  TAKE 1 TABLET AT BEDTIME Tabori, Katherine E, MD  Active   Semaglutide , 1 MG/DOSE, (OZEMPIC , 1 MG/DOSE,) 4 MG/3ML SOPN [id number]  INJECT 1MG  UNDER THE SKIN ONE TIME WEEKLY Mahlon Comer BRAVO, MD  Active  triamcinolone  cream (KENALOG ) 0.1 % [id number]  Apply 1 Application topically 2 (two) times daily. Tabori, Katherine E, MD  Active               Assessment/Plan:   Medication Management / Access: - called [employer] - patient is eligible to get 1 more refill of Ozempic  around 12/18/2023. Completed reorder request and forwarded to PCP to review and sign.  - Also complete 2026 re-enrollment for [employer] / Ozempic  provider portion - forwarded to PCP to review and sign. Patient reports she completed her portion on-line.    Follow Up Plan: 3 to 4 weeks.    Madelin Ray, PharmD Clinical Pharmacist [facility]  Population Health [phone]

## 2023-12-03 DIAGNOSIS — N2581 Secondary hyperparathyroidism of renal origin: Secondary | ICD-10-CM | POA: Diagnosis not present

## 2023-12-03 DIAGNOSIS — N186 End stage renal disease: Secondary | ICD-10-CM | POA: Diagnosis not present

## 2023-12-03 DIAGNOSIS — Z992 Dependence on renal dialysis: Secondary | ICD-10-CM | POA: Diagnosis not present

## 2023-12-03 DIAGNOSIS — I120 Hypertensive chronic kidney disease with stage 5 chronic kidney disease or end stage renal disease: Secondary | ICD-10-CM | POA: Diagnosis not present

## 2023-12-04 DIAGNOSIS — I132 Hypertensive heart and chronic kidney disease with heart failure and with stage 5 chronic kidney disease, or end stage renal disease: Secondary | ICD-10-CM | POA: Diagnosis not present

## 2023-12-04 DIAGNOSIS — Z992 Dependence on renal dialysis: Secondary | ICD-10-CM | POA: Diagnosis not present

## 2023-12-04 DIAGNOSIS — E785 Hyperlipidemia, unspecified: Secondary | ICD-10-CM | POA: Diagnosis not present

## 2023-12-04 DIAGNOSIS — I509 Heart failure, unspecified: Secondary | ICD-10-CM | POA: Diagnosis not present

## 2023-12-04 DIAGNOSIS — Z8249 Family history of ischemic heart disease and other diseases of the circulatory system: Secondary | ICD-10-CM | POA: Diagnosis not present

## 2023-12-04 DIAGNOSIS — Z809 Family history of malignant neoplasm, unspecified: Secondary | ICD-10-CM | POA: Diagnosis not present

## 2023-12-04 DIAGNOSIS — Z833 Family history of diabetes mellitus: Secondary | ICD-10-CM | POA: Diagnosis not present

## 2023-12-04 DIAGNOSIS — K219 Gastro-esophageal reflux disease without esophagitis: Secondary | ICD-10-CM | POA: Diagnosis not present

## 2023-12-04 DIAGNOSIS — E039 Hypothyroidism, unspecified: Secondary | ICD-10-CM | POA: Diagnosis not present

## 2023-12-04 DIAGNOSIS — E1122 Type 2 diabetes mellitus with diabetic chronic kidney disease: Secondary | ICD-10-CM | POA: Diagnosis not present

## 2023-12-04 DIAGNOSIS — N186 End stage renal disease: Secondary | ICD-10-CM | POA: Diagnosis not present

## 2023-12-06 DIAGNOSIS — N186 End stage renal disease: Secondary | ICD-10-CM | POA: Diagnosis not present

## 2023-12-06 DIAGNOSIS — N2581 Secondary hyperparathyroidism of renal origin: Secondary | ICD-10-CM | POA: Diagnosis not present

## 2023-12-06 DIAGNOSIS — Z992 Dependence on renal dialysis: Secondary | ICD-10-CM | POA: Diagnosis not present

## 2023-12-13 DIAGNOSIS — N186 End stage renal disease: Secondary | ICD-10-CM | POA: Diagnosis not present

## 2023-12-14 DIAGNOSIS — Z992 Dependence on renal dialysis: Secondary | ICD-10-CM | POA: Diagnosis not present

## 2023-12-14 DIAGNOSIS — N186 End stage renal disease: Secondary | ICD-10-CM | POA: Diagnosis not present

## 2023-12-14 DIAGNOSIS — N2581 Secondary hyperparathyroidism of renal origin: Secondary | ICD-10-CM | POA: Diagnosis not present

## 2023-12-17 DIAGNOSIS — N186 End stage renal disease: Secondary | ICD-10-CM | POA: Diagnosis not present

## 2023-12-17 DIAGNOSIS — Z992 Dependence on renal dialysis: Secondary | ICD-10-CM | POA: Diagnosis not present

## 2023-12-17 DIAGNOSIS — N2581 Secondary hyperparathyroidism of renal origin: Secondary | ICD-10-CM | POA: Diagnosis not present

## 2023-12-21 ENCOUNTER — Encounter: Payer: Self-pay | Admitting: Pharmacist

## 2023-12-21 DIAGNOSIS — N186 End stage renal disease: Secondary | ICD-10-CM | POA: Diagnosis not present

## 2023-12-21 DIAGNOSIS — N2581 Secondary hyperparathyroidism of renal origin: Secondary | ICD-10-CM | POA: Diagnosis not present

## 2023-12-22 DIAGNOSIS — N2581 Secondary hyperparathyroidism of renal origin: Secondary | ICD-10-CM | POA: Diagnosis not present

## 2023-12-22 DIAGNOSIS — Z992 Dependence on renal dialysis: Secondary | ICD-10-CM | POA: Diagnosis not present

## 2023-12-22 DIAGNOSIS — N186 End stage renal disease: Secondary | ICD-10-CM | POA: Diagnosis not present

## 2023-12-24 DIAGNOSIS — N2581 Secondary hyperparathyroidism of renal origin: Secondary | ICD-10-CM | POA: Diagnosis not present

## 2023-12-24 DIAGNOSIS — N186 End stage renal disease: Secondary | ICD-10-CM | POA: Diagnosis not present

## 2023-12-24 DIAGNOSIS — Z992 Dependence on renal dialysis: Secondary | ICD-10-CM | POA: Diagnosis not present

## 2023-12-27 DIAGNOSIS — Z992 Dependence on renal dialysis: Secondary | ICD-10-CM | POA: Diagnosis not present

## 2023-12-27 DIAGNOSIS — N186 End stage renal disease: Secondary | ICD-10-CM | POA: Diagnosis not present

## 2023-12-27 DIAGNOSIS — N2581 Secondary hyperparathyroidism of renal origin: Secondary | ICD-10-CM | POA: Diagnosis not present

## 2023-12-30 DIAGNOSIS — N186 End stage renal disease: Secondary | ICD-10-CM | POA: Diagnosis not present

## 2024-01-02 DIAGNOSIS — Z992 Dependence on renal dialysis: Secondary | ICD-10-CM | POA: Diagnosis not present

## 2024-01-02 DIAGNOSIS — I120 Hypertensive chronic kidney disease with stage 5 chronic kidney disease or end stage renal disease: Secondary | ICD-10-CM | POA: Diagnosis not present

## 2024-01-02 DIAGNOSIS — N186 End stage renal disease: Secondary | ICD-10-CM | POA: Diagnosis not present

## 2024-01-10 ENCOUNTER — Other Ambulatory Visit: Payer: Self-pay | Admitting: Family Medicine

## 2024-01-10 DIAGNOSIS — I10 Essential (primary) hypertension: Secondary | ICD-10-CM

## 2024-01-11 ENCOUNTER — Ambulatory Visit

## 2024-01-11 VITALS — Ht 61.0 in | Wt 124.0 lb

## 2024-01-11 DIAGNOSIS — Z0000 Encounter for general adult medical examination without abnormal findings: Secondary | ICD-10-CM

## 2024-01-11 NOTE — Progress Notes (Signed)
 Chief Complaint  Patient presents with   Medicare Wellness     Subjective:   Marissa Scott is a 58 y.o. female who presents for a Medicare Annual Wellness Visit.  Visit info / Clinical Intake: Medicare Wellness Visit Type:: Subsequent Annual Wellness Visit Persons participating in visit and providing information:: patient Medicare Wellness Visit Mode:: Telephone If telephone:: video declined Since this visit was completed virtually, some vitals may be partially provided or unavailable. Missing vitals are due to the limitations of the virtual format.: Unable to obtain vitals - no equipment If Telephone or Video please confirm:: I connected with patient using audio/video enable telemedicine. I verified patient identity with two identifiers, discussed telehealth limitations, and patient agreed to proceed. Patient Location:: home Provider Location:: home Interpreter Needed?: No Pre-visit prep was completed: no AWV questionnaire completed by patient prior to visit?: no Living arrangements:: lives with spouse/significant other Patient's Overall Health Status Rating: (!) fair Typical amount of pain: none Does pain affect daily life?: no Are you currently prescribed opioids?: no  Dietary Habits and Nutritional Risks How many meals a day?: (!) 1 Eats fruit and vegetables daily?: (!) no Most meals are obtained by: preparing own meals; eating out In the last 2 weeks, have you had any of the following?: none Diabetic:: (!) yes Any non-healing wounds?: no How often do you check your BS?: as needed Would you like to be referred to a Nutritionist or for Diabetic Management? : no  Functional Status Activities of Daily Living (to include ambulation/medication): Independent Ambulation: Independent Medication Administration: Independent Home Management (perform basic housework or laundry): Independent Manage your own finances?: yes Primary transportation is: driving Concerns about  hearing?: no  Fall Screening Falls in the past year?: 0 Number of falls in past year: 0 Was there an injury with Fall?: 0 Fall Risk Category Calculator: 0 Patient Fall Risk Level: Low Fall Risk  Fall Risk Patient at Risk for Falls Due to: No Fall Risks Fall risk Follow up: Falls evaluation completed; Education provided; Falls prevention discussed; Follow up appointment  Home and Transportation Safety: All rugs have non-skid backing?: (!) no All stairs or steps have railings?: yes Grab bars in the bathtub or shower?: (!) no Have non-skid surface in bathtub or shower?: (!) no Good home lighting?: yes Regular seat belt use?: yes Hospital stays in the last year:: no  Cognitive Assessment Difficulty concentrating, remembering, or making decisions? : no Will 6CIT or Mini Cog be Completed: yes What year is it?: 0 points What month is it?: 0 points Give patient an address phrase to remember (5 components): Its very sunny outside today in December About what time is it?: 0 points Count backwards from 20 to 1: 0 points Say the months of the year in reverse: 0 points Repeat the address phrase from earlier: 0 points 6 CIT Score: 0 points  Advance Directives (For Healthcare) Does Patient Have a Medical Advance Directive?: No Would patient like information on creating a medical advance directive?: No - Patient declined  Reviewed/Updated  Reviewed/Updated: Reviewed All (Medical, Surgical, Family, Medications, Allergies, Care Teams, Patient Goals); Surgical History; Family History; Medications; Allergies; Care Teams; Patient Goals; Medical History    Allergies (verified) Egg protein-containing drug products, Enalapril  maleate, Penicillin g, and Penicillins   Current Medications (verified) Outpatient Encounter Medications as of 01/11/2024  Medication Sig   acetaminophen  (TYLENOL ) 650 MG CR tablet Take 650 mg by mouth every 6 (six) hours.   amLODipine  (NORVASC ) 2.5 MG tablet TAKE  1  TABLET EVERY DAY   carvedilol  (COREG ) 12.5 MG tablet Take 1 tablet (12.5 mg total) by mouth 2 (two) times daily with a meal.   levothyroxine  (SYNTHROID ) 88 MCG tablet TAKE 1 TABLET EVERY DAY   Multiple Vitamin (MULTI-VITAMINS) TABS Take 1 tablet by mouth daily.   rosuvastatin  (CRESTOR ) 10 MG tablet TAKE 1 TABLET AT BEDTIME   Semaglutide , 1 MG/DOSE, (OZEMPIC , 1 MG/DOSE,) 4 MG/3ML SOPN INJECT 1MG  UNDER THE SKIN ONE TIME WEEKLY   triamcinolone  cream (KENALOG ) 0.1 % Apply 1 Application topically 2 (two) times daily.   No facility-administered encounter medications on file as of 01/11/2024.    History: Past Medical History:  Diagnosis Date   Anemia    Arthritis    DIABETES MELLITUS, TYPE II 01/30/2008   HYPERLIPIDEMIA 01/30/2008   HYPERTENSION 01/30/2008   HYPERURICEMIA 10/01/2008   Hypothyroidism    Obesity    Polycystic ovaries 01/04/2009   RENAL DISEASE, CHRONIC, STAGE III 01/30/2008   Follows w/ Renal   Past Surgical History:  Procedure Laterality Date   APPENDECTOMY     COLONOSCOPY  03/05/2014   DIALYSIS/PERMA CATHETER INSERTION N/A 10/15/2023   Procedure: DIALYSIS/PERMA CATHETER INSERTION;  Surgeon: Gretta Lonni PARAS, MD;  Location: [facility];  Service: Cardiovascular;  Laterality: N/A;   ESOPHAGOGASTRODUODENOSCOPY     LUMBAR FUSION     L4-5   LUMBAR LAMINECTOMY     L4-L5   NEPHRECTOMY     left removed, partial right-Ottelin   Neural Stimulator     POLYPECTOMY     Right hand cyst     TUBAL LIGATION     GYN Mezer   Family History  Problem Relation Age of Onset   Hypertension Father    Heart disease Father        CAD   Diabetes Father    Stroke Father    Colon cancer Father 52   Colon polyps Father    Colon cancer Cousin 55       Lynch Syndrome   Stomach cancer Cousin 71   Kidney cancer Cousin    Lung cancer Maternal Aunt 60       history of smoking   Esophageal cancer Neg Hx    Rectal cancer Neg Hx    Social History   Occupational History    Occupation: Disabled  Tobacco Use   Smoking status: Never   Smokeless tobacco: Never  Vaping Use   Vaping status: Never Used  Substance and Sexual Activity   Alcohol use: Yes    Comment: rarely   Drug use: No   Sexual activity: Yes   Tobacco Counseling Counseling given: Not Answered  SDOH Screenings   Food Insecurity: No Food Insecurity (01/11/2024)  Housing: Unknown (01/11/2024)  Transportation Needs: No Transportation Needs (01/11/2024)  Utilities: Not At Risk (01/11/2024)  Alcohol Screen: Low Risk  (08/26/2022)  Depression (PHQ2-9): Low Risk  (01/11/2024)  Financial Resource Strain: Low Risk  (08/26/2022)  Physical Activity: Inactive (01/11/2024)  Social Connections: Moderately Isolated (01/11/2024)  Stress: No Stress Concern Present (01/11/2024)  Tobacco Use: Low Risk  (01/11/2024)  Health Literacy: Adequate Health Literacy (01/11/2024)   See flowsheets for full screening details  Depression Screen PHQ 2 & 9 Depression Scale- Over the past 2 weeks, how often have you been bothered by any of the following problems? Little interest or pleasure in doing things: 0 Feeling down, depressed, or hopeless (PHQ Adolescent also includes...irritable): 0 PHQ-2 Total Score: 0 Trouble falling or staying asleep, or  sleeping too much: 0 Feeling tired or having little energy: 0 Poor appetite or overeating (PHQ Adolescent also includes...weight loss): 0 Feeling bad about yourself - or that you are a failure or have let yourself or your family down: 0 Trouble concentrating on things, such as reading the newspaper or watching television (PHQ Adolescent also includes...like school work): 0 Moving or speaking so slowly that other people could have noticed. Or the opposite - being so fidgety or restless that you have been moving around a lot more than usual: 0 Thoughts that you would be better off dead, or of hurting yourself in some way: 0 PHQ-9 Total Score: 0 If you checked off any problems, how  difficult have these problems made it for you to do your work, take care of things at home, or get along with other people?: Not difficult at all     Goals Addressed             This Visit's Progress    Patient Stated   Not on track    Get a kidney             Objective:    Today's Vitals   01/11/24 1434  Weight: 124 lb (56.2 kg)  Height: 5' 1 (1.549 m)   Body mass index is 23.43 kg/m.  Hearing/Vision screen Hearing Screening - Comments:: No trouble hearing Vision Screening - Comments:: Unsure of name Up to date Immunizations and Health Maintenance Health Maintenance  Topic Date Due   COVID-19 Vaccine (5 - 2025-26 season) 10/04/2023   Diabetic kidney evaluation - Urine ACR  01/15/2024   Mammogram  02/02/2024   Influenza Vaccine  05/02/2024 (Originally 09/03/2023)   HEMOGLOBIN A1C  03/11/2024   Colonoscopy  07/26/2024   OPHTHALMOLOGY EXAM  09/01/2024   Diabetic kidney evaluation - eGFR measurement  09/08/2024   FOOT EXAM  09/08/2024   Medicare Annual Wellness (AWV)  01/10/2025   Cervical Cancer Screening (HPV/Pap Cotest)  02/24/2026   DTaP/Tdap/Td (3 - Td or Tdap) 03/06/2031   Pneumococcal Vaccine: 50+ Years  Completed   Hepatitis B Vaccines 19-59 Average Risk  Completed   Hepatitis C Screening  Completed   HIV Screening  Completed   Zoster Vaccines- Shingrix  Completed   HPV VACCINES  Aged Out   Meningococcal B Vaccine  Aged Out        Assessment/Plan:  This is a routine wellness examination for Ione.  Patient Care Team: Mahlon Comer BRAVO, MD as PCP - General (Family Medicine) Dolphus Reiter, MD as Consulting Physician (Rheumatology) Betsey Channel, MD as Consulting Physician (Nephrology) Robinson Pao, MD as Consulting Physician (Dermatology) Towana Fonda RAMAN, MD as Referring Physician (Dermatology) Dannielle Bouchard, DO as Consulting Physician (Obstetrics and Gynecology) Fleming-Neon, [place] (Optometry) Vinson Rilla Marca Hadassah, MD as  Referring Physician (Nephrology) Gearline Norris, MD as Consulting Physician (Nephrology) Carla Milling, RPH-CPP (Pharmacist) [facility]  I have personally reviewed and noted the following in the patient's chart:   Medical and social history Use of alcohol, tobacco or illicit drugs  Current medications and supplements including opioid prescriptions. Functional ability and status Nutritional status Physical activity Advanced directives List of other physicians Hospitalizations, surgeries, and ER visits in previous 12 months Vitals Screenings to include cognitive, depression, and falls Referrals and appointments  No orders of the defined types were placed in this encounter.  In addition, I have reviewed and discussed with patient certain preventive protocols, quality metrics, and best practice recommendations. A written  personalized care plan for preventive services as well as general preventive health recommendations were provided to patient.   Mliss Graff, LPN   [id number]   Return in 1 year (on 01/10/2025).  After Visit Summary: (MyChart) Due to this being a telephonic visit, the after visit summary with patients personalized plan was offered to patient via MyChart   Nurse Notes:

## 2024-01-11 NOTE — Patient Instructions (Signed)
 Marissa Scott,  Thank you for taking the time for your Medicare Wellness Visit. I appreciate your continued commitment to your health goals. Please review the care plan we discussed, and feel free to reach out if I can assist you further.  Please note that Annual Wellness Visits do not include a physical exam. Some assessments may be limited, especially if the visit was conducted virtually. If needed, we may recommend an in-person follow-up with your provider.  Ongoing Care Seeing your primary care provider every 3 to 6 months helps us  monitor your health and provide consistent, personalized care.   Referrals If a referral was made during today's visit and you haven't received any updates within two weeks, please contact the referred provider directly to check on the status.  Recommended Screenings:  Health Maintenance  Topic Date Due   COVID-19 Vaccine (5 - 2025-26 season) 10/04/2023   Yearly kidney health urinalysis for diabetes  01/15/2024   Breast Cancer Screening  02/02/2024   Flu Shot  05/02/2024*   Hemoglobin A1C  03/11/2024   Colon Cancer Screening  07/26/2024   Eye exam for diabetics  09/01/2024   Yearly kidney function blood test for diabetes  09/08/2024   Complete foot exam   09/08/2024   Medicare Annual Wellness Visit  01/10/2025   Pap with HPV screening  02/24/2026   DTaP/Tdap/Td vaccine (3 - Td or Tdap) 03/06/2031   Pneumococcal Vaccine for age over 76  Completed   Hepatitis B Vaccine  Completed   Hepatitis C Screening  Completed   HIV Screening  Completed   Zoster (Shingles) Vaccine  Completed   HPV Vaccine  Aged Out   Meningitis B Vaccine  Aged Out  *Topic was postponed. The date shown is not the original due date.       01/11/2024    2:36 PM  Advanced Directives  Does Patient Have a Medical Advance Directive? No  Would patient like information on creating a medical advance directive? No - Patient declined    Vision: Annual vision screenings are recommended  for early detection of glaucoma, cataracts, and diabetic retinopathy. These exams can also reveal signs of chronic conditions such as diabetes and high blood pressure.  Dental: Annual dental screenings help detect early signs of oral cancer, gum disease, and other conditions linked to overall health, including heart disease and diabetes.  Please see the attached documents for additional preventive care recommendations.    Marissa Scott , Thank you for taking time to come for your Medicare Wellness Visit. I appreciate your ongoing commitment to your health goals. Please review the following plan we discussed and let me know if I can assist you in the future.   Screening recommendations/referrals: Colonoscopy:  Mammogram:   Recommended yearly ophthalmology/optometry visit for glaucoma screening and checkup Recommended yearly dental visit for hygiene and checkup  Vaccinations: Influenza vaccine:  Pneumococcal vaccine:  Tdap vaccine:  Shingles vaccine:      Preventive Care  Female Preventive care refers to lifestyle choices and visits with your health care provider that can promote health and wellness. What does preventive care include? A yearly physical exam. This is also called an annual well check. Dental exams once or twice a year. Routine eye exams. Ask your health care provider how often you should have your eyes checked. Personal lifestyle choices, including: Daily care of your teeth and gums. Regular physical activity. Eating a healthy diet. Avoiding tobacco and drug use. Limiting alcohol use. Practicing safe sex. Taking low-dose  aspirin every day. Taking vitamin and mineral supplements as recommended by your health care provider. What happens during an annual well check? The services and screenings done by your health care provider during your annual well check will depend on your age, overall health, lifestyle risk factors, and family history of disease. Counseling  Your  health care provider may ask you questions about your: Alcohol use. Tobacco use. Drug use. Emotional well-being. Home and relationship well-being. Sexual activity. Eating habits. History of falls. Memory and ability to understand (cognition). Work and work astronomer. Reproductive health. Screening  You may have the following tests or measurements: Height, weight, and BMI. Blood pressure. Lipid and cholesterol levels. These may be checked every 5 years, or more frequently if you are over 38 years old. Skin check. Lung cancer screening. You may have this screening every year starting at age 86 if you have a 30-pack-year history of smoking and currently smoke or have quit within the past 15 years. Fecal occult blood test (FOBT) of the stool. You may have this test every year starting at age 89. Flexible sigmoidoscopy or colonoscopy. You may have a sigmoidoscopy every 5 years or a colonoscopy every 10 years starting at age 29. Hepatitis C blood test. Hepatitis B blood test. Sexually transmitted disease (STD) testing. Diabetes screening. This is done by checking your blood sugar (glucose) after you have not eaten for a while (fasting). You may have this done every 1-3 years. Bone density scan. This is done to screen for osteoporosis. You may have this done starting at age 73. Mammogram. This may be done every 1-2 years. Talk to your health care provider about how often you should have regular mammograms. Talk with your health care provider about your test results, treatment options, and if necessary, the need for more tests. Vaccines  Your health care provider may recommend certain vaccines, such as: Influenza vaccine. This is recommended every year. Tetanus, diphtheria, and acellular pertussis (Tdap, Td) vaccine. You may need a Td booster every 10 years. Zoster vaccine. You may need this after age 1. Pneumococcal 13-valent conjugate (PCV13) vaccine. One dose is recommended after age  2. Pneumococcal polysaccharide (PPSV23) vaccine. One dose is recommended after age 1. Talk to your health care provider about which screenings and vaccines you need and how often you need them. This information is not intended to replace advice given to you by your health care provider. Make sure you discuss any questions you have with your health care provider. Document Released: 02/15/2015 Document Revised: 10/09/2015 Document Reviewed: 11/20/2014 Elsevier Interactive Patient Education  2017 [employer].  Fall Prevention in the Home Falls can cause injuries. They can happen to people of all ages. There are many things you can do to make your home safe and to help prevent falls. What can I do on the outside of my home? Regularly fix the edges of walkways and driveways and fix any cracks. Remove anything that might make you trip as you walk through a door, such as a raised step or threshold. Trim any bushes or trees on the path to your home. Use bright outdoor lighting. Clear any walking paths of anything that might make someone trip, such as rocks or tools. Regularly check to see if handrails are loose or broken. Make sure that both sides of any steps have handrails. Any raised decks and porches should have guardrails on the edges. Have any leaves, snow, or ice cleared regularly. Use sand or salt on walking paths during  winter. Clean up any spills in your garage right away. This includes oil or grease spills. What can I do in the bathroom? Use night lights. Install grab bars by the toilet and in the tub and shower. Do not use towel bars as grab bars. Use non-skid mats or decals in the tub or shower. If you need to sit down in the shower, use a plastic, non-slip stool. Keep the floor dry. Clean up any water that spills on the floor as soon as it happens. Remove soap buildup in the tub or shower regularly. Attach bath mats securely with double-sided non-slip rug tape. Do not have throw  rugs and other things on the floor that can make you trip. What can I do in the bedroom? Use night lights. Make sure that you have a light by your bed that is easy to reach. Do not use any sheets or blankets that are too big for your bed. They should not hang down onto the floor. Have a firm chair that has side arms. You can use this for support while you get dressed. Do not have throw rugs and other things on the floor that can make you trip. What can I do in the kitchen? Clean up any spills right away. Avoid walking on wet floors. Keep items that you use a lot in easy-to-reach places. If you need to reach something above you, use a strong step stool that has a grab bar. Keep electrical cords out of the way. Do not use floor polish or wax that makes floors slippery. If you must use wax, use non-skid floor wax. Do not have throw rugs and other things on the floor that can make you trip. What can I do with my stairs? Do not leave any items on the stairs. Make sure that there are handrails on both sides of the stairs and use them. Fix handrails that are broken or loose. Make sure that handrails are as long as the stairways. Check any carpeting to make sure that it is firmly attached to the stairs. Fix any carpet that is loose or worn. Avoid having throw rugs at the top or bottom of the stairs. If you do have throw rugs, attach them to the floor with carpet tape. Make sure that you have a light switch at the top of the stairs and the bottom of the stairs. If you do not have them, ask someone to add them for you. What else can I do to help prevent falls? Wear shoes that: Do not have high heels. Have rubber bottoms. Are comfortable and fit you well. Are closed at the toe. Do not wear sandals. If you use a stepladder: Make sure that it is fully opened. Do not climb a closed stepladder. Make sure that both sides of the stepladder are locked into place. Ask someone to hold it for you, if  possible. Clearly mark and make sure that you can see: Any grab bars or handrails. First and last steps. Where the edge of each step is. Use tools that help you move around (mobility aids) if they are needed. These include: Canes. Walkers. Scooters. Crutches. Turn on the lights when you go into a dark area. Replace any light bulbs as soon as they burn out. Set up your furniture so you have a clear path. Avoid moving your furniture around. If any of your floors are uneven, fix them. If there are any pets around you, be aware of where they are. Review your  medicines with your doctor. Some medicines can make you feel dizzy. This can increase your chance of falling. Ask your doctor what other things that you can do to help prevent falls. This information is not intended to replace advice given to you by your health care provider. Make sure you discuss any questions you have with your health care provider. Document Released: 11/15/2008 Document Revised: 06/27/2015 Document Reviewed: 02/23/2014 Elsevier Interactive Patient Education  2017 [employer].

## 2024-02-24 ENCOUNTER — Telehealth (HOSPITAL_COMMUNITY): Payer: Self-pay

## 2024-02-24 NOTE — Telephone Encounter (Signed)
 Received referral from [facility] for Cardiac Rehab. Called patient to see if she was interested in program.  Patient stated that she was not interested due to it being a conflict with the dialysis that she does 4 times a week.  Advised that referral was good for a year.  No referral created in system.  Request for Cardiac rehab scanned to media.

## 2024-02-25 ENCOUNTER — Telehealth: Payer: Self-pay | Admitting: *Deleted

## 2024-02-25 NOTE — Transitions of Care (Post Inpatient/ED Visit) (Signed)
 "  02/25/2024  Name: Marissa Scott MRN: [id number] DOB: 15-Dec-1965  Today's TOC FU Call Status: Today's TOC FU Call Status:: Successful TOC FU Call Completed TOC FU Call Complete Date: 02/25/24  Patient's Name and Date of Birth confirmed. Name, DOB  Transition Care Management Follow-up Telephone Call Date of Discharge: 02/24/24 Discharge Facility: Other ([facility]) Name of Other ([facility]) Discharge Facility: [facility] Type of Discharge: Inpatient Admission Primary Inpatient Discharge Diagnosis:: Planned surgical CABG x 3 How have you been since you were released from the hospital?: Better (I am fine, no problems; can't even really see where they did the incision- looks great and am in no pain.  I do my own hemodialysis at home.  I am in a program with [facility] where the nurse calls me regaularly, so I do not need additional calls) Any questions or concerns?: No  Items Reviewed: Did you receive and understand the discharge instructions provided?: Yes (briefly reviewed with patient who verbalizes good understanding of same - outside hospital AVS) Medications obtained,verified, and reconciled?: No (Declined medication reconciliation/ review; confirmed per patient report obtained/ is taking all newly Rx'd medications as instructed; self-manages medications and denies questions/ concerns around medications today) Medications Not Reviewed Reasons:: Other: (Patient declined all aspects of medication review during TOC call- states she has read over all instructions and has no questions and does not want to review during call-- will review with Dr. Mahlon during scheduled hospital follow up visit on 03/02/24) Any new allergies since your discharge?: No Dietary orders reviewed?: Yes Type of Diet Ordered:: Pretty much regular, try to eat healthy Do you have support at home?: Yes People in Home [RPT]: spouse Name of Support/Comfort Primary Source: Reports independent in self-care  activities; resides with supportive spouse- assists as/ if needed/ indicated  Medications Reviewed Today: Medications Reviewed Today     Reviewed by Daphnee Preiss M, RN (Registered Nurse) on 02/25/24 at [phone]  Med List Status: <None>   Medication Order Taking? Sig Documenting Provider Last Dose Status Informant  acetaminophen  (TYLENOL ) 650 MG CR tablet [id number]  Take 650 mg by mouth every 6 (six) hours. [provider]  Active   amLODipine  (NORVASC ) 2.5 MG tablet [id number]  TAKE 1 TABLET EVERY DAY Tabori, Katherine E, MD  Active   carvedilol  (COREG ) 12.5 MG tablet [id number]  Take 1 tablet (12.5 mg total) by mouth 2 (two) times daily with a meal. Mahlon Comer BRAVO, MD  Active   levothyroxine  (SYNTHROID ) 88 MCG tablet [id number]  TAKE 1 TABLET EVERY DAY Tabori, Katherine E, MD  Active   Multiple Vitamin (MULTI-VITAMINS) TABS [id number]  Take 1 tablet by mouth daily. [provider]  Active Self           Med Note BEVERLEE, JEFFREY W   Thu Oct 11, 2014 10:50 PM) ....  rosuvastatin  (CRESTOR ) 10 MG tablet [id number]  TAKE 1 TABLET AT BEDTIME Tabori, Katherine E, MD  Active   Semaglutide , 1 MG/DOSE, (OZEMPIC , 1 MG/DOSE,) 4 MG/3ML SOPN [id number]  INJECT 1MG  UNDER THE SKIN ONE TIME WEEKLY Tabori, Katherine E, MD  Active   triamcinolone  cream (KENALOG ) 0.1 % [id number]  Apply 1 Application topically 2 (two) times daily. Mahlon Comer BRAVO, MD  Active            Home Care and Equipment/Supplies: Were Home Health Services Ordered?: No Any new equipment or medical supplies ordered?: No  Functional Questionnaire: Do you need assistance with bathing/showering or dressing?: No Do you need assistance with meal  preparation?: No Do you need assistance with eating?: No Do you have difficulty maintaining continence: No Do you need assistance with getting out of bed/getting out of a chair/moving?: No Do you have difficulty managing or taking your medications?: No  Follow up appointments  reviewed: PCP Follow-up appointment confirmed?: Yes Date of PCP follow-up appointment?: 03/02/24 Follow-up Provider: PCP- Dr. Mahlon Specialist [facility] Follow-up appointment confirmed?: Yes Date of Specialist follow-up appointment?:  (I don't remember the exact date- but they have it scheduled in about 4 weeks) Follow-Up Specialty Provider:: Cardiothoracic surgeon at [facility] Do you need transportation to your follow-up appointment?: No Do you understand care options if your condition(s) worsen?: Yes-patient verbalized understanding  SDOH Interventions Today    Flowsheet Row Most Recent Value  SDOH Interventions   Food Insecurity Interventions Intervention Not Indicated  Housing Interventions Intervention Not Indicated  Transportation Interventions Intervention Not Indicated  [drives self at baseline,  husband assisting after recent planned surgery for CABG: has driving restrictions for about one month]  Utilities Interventions Intervention Not Indicated   See TOC assessment tabs for additional assessment/ TOC intervention information Reinforced signs/ symptoms incisional infection along with corresponding action plan  Confirmed patient has plan in place for possible loss of power in setting of sever upcoming weather forecast/ home hemodialysis  Patient declines need for ongoing/ further care management/ coordination outreach; declines enrollment in 30-day TOC program- declines taking my direct phone number should needs/ concerns arise post-TOC call   Pls call/ message for questions,  Ahlijah Raia Mckinney Damere Brandenburg, RN, BSN, CCRN Alumnus RN Care Manager  Transitions of Care  [facility]  [place] [phone]: direct office   "

## 2024-03-02 ENCOUNTER — Ambulatory Visit: Admitting: Family Medicine

## 2024-03-02 ENCOUNTER — Encounter: Payer: Self-pay | Admitting: Family Medicine

## 2024-03-02 VITALS — BP 108/62 | HR 66 | Ht 60.0 in | Wt 130.2 lb

## 2024-03-02 DIAGNOSIS — Z951 Presence of aortocoronary bypass graft: Secondary | ICD-10-CM | POA: Diagnosis not present

## 2024-03-02 NOTE — Patient Instructions (Signed)
 Follow up in 6 months to recheck sugar No need to repeat labs today- you've been poked enough! DECREASE Amlodipine  to 1/2 tab daily.  Keep me updated if dizziness improves Try Miralax daily to keep bowels moving Call with any questions or concerns Stay Safe!  Stay Healthy! Hang in there!!

## 2024-03-02 NOTE — Progress Notes (Signed)
" ° °  Subjective:    Patient ID: Marissa Scott, female    DOB: 03/14/1965, 59 y.o.   MRN: [id number]  HPI Hospital f/u- pt was admitted 1/16 for CABG in preparation for renal transplant.  Pt tolerated procedure well.  D/C'd on Tylenol , Amlodipine , ASA, Coreg , Plavix, Levothyroxine , Omeprazole, Miralax, Crestor , Ozempic .  Med list reconciled w/ d/c summary.  Pt reports current Amlodipine  dose is 10mg  daily.  Is having dizziness and feels BP is too low.  Denies pain.  No SOB.  Struggling w/ constipation since starting HD.  On Miralax, Senna, Lactulose.  A1C on 02/14/24- 5%   Review of Systems For ROS see HPI     Objective:   Physical Exam Vitals reviewed.  Constitutional:      General: She is not in acute distress.    Appearance: Normal appearance. She is not ill-appearing.  HENT:     Head: Normocephalic and atraumatic.  Cardiovascular:     Rate and Rhythm: Normal rate and regular rhythm.     Pulses: Normal pulses.     Heart sounds: Normal heart sounds.  Pulmonary:     Effort: Pulmonary effort is normal. No respiratory distress.     Breath sounds: No wheezing or rhonchi.  Abdominal:     General: There is no distension.     Palpations: Abdomen is soft.     Tenderness: There is no abdominal tenderness. There is no guarding or rebound.  Skin:    General: Skin is warm and dry.     Comments: Well healing sternotomy  Neurological:     General: No focal deficit present.     Mental Status: She is alert and oriented to person, place, and time.  Psychiatric:        Mood and Affect: Mood normal.        Behavior: Behavior normal.        Thought Content: Thought content normal.           Assessment & Plan:    "

## 2024-03-04 DIAGNOSIS — Z951 Presence of aortocoronary bypass graft: Secondary | ICD-10-CM | POA: Insufficient documentation

## 2024-03-04 DIAGNOSIS — Z789 Other specified health status: Secondary | ICD-10-CM | POA: Insufficient documentation

## 2024-03-04 NOTE — Assessment & Plan Note (Signed)
 New.  Pt had CABG on 1/16 in preparation for possible kidney transplant.  At this time, she denies pain.  Asymptomatic w/ exception of dizziness due to low BP.  Will decrease Amlodipine  to 1/2 tab.  Pt expressed understanding and is in agreement w/ plan.

## 2024-03-05 ENCOUNTER — Other Ambulatory Visit: Payer: Self-pay | Admitting: Family Medicine

## 2024-03-14 ENCOUNTER — Encounter: Admitting: Family Medicine
